# Patient Record
Sex: Female | Born: 1945 | Race: White | Hispanic: No | Marital: Married | Smoking: Never smoker
Health system: Southern US, Community
[De-identification: ages and names within clinical notes are randomized; demographics above are authoritative.]

## PROBLEM LIST (undated history)

## (undated) DIAGNOSIS — I10 Essential (primary) hypertension: Secondary | ICD-10-CM

## (undated) DIAGNOSIS — N979 Female infertility, unspecified: Secondary | ICD-10-CM

## (undated) DIAGNOSIS — C4491 Basal cell carcinoma of skin, unspecified: Secondary | ICD-10-CM

## (undated) DIAGNOSIS — M47819 Spondylosis without myelopathy or radiculopathy, site unspecified: Secondary | ICD-10-CM

## (undated) DIAGNOSIS — D219 Benign neoplasm of connective and other soft tissue, unspecified: Secondary | ICD-10-CM

## (undated) DIAGNOSIS — C801 Malignant (primary) neoplasm, unspecified: Secondary | ICD-10-CM

## (undated) DIAGNOSIS — IMO0002 Reserved for concepts with insufficient information to code with codable children: Secondary | ICD-10-CM

## (undated) DIAGNOSIS — E7800 Pure hypercholesterolemia, unspecified: Secondary | ICD-10-CM

## (undated) DIAGNOSIS — I719 Aortic aneurysm of unspecified site, without rupture: Secondary | ICD-10-CM

## (undated) HISTORY — PX: TONSILLECTOMY AND ADENOIDECTOMY: SUR1326

## (undated) HISTORY — DX: Essential (primary) hypertension: I10

## (undated) HISTORY — PX: ANKLE SURGERY: SHX546

## (undated) HISTORY — DX: Spondylosis without myelopathy or radiculopathy, site unspecified: M47.819

## (undated) HISTORY — PX: RETINAL DETACHMENT SURGERY: SHX105

## (undated) HISTORY — DX: Female infertility, unspecified: N97.9

## (undated) HISTORY — DX: Benign neoplasm of connective and other soft tissue, unspecified: D21.9

## (undated) HISTORY — DX: Malignant (primary) neoplasm, unspecified: C80.1

## (undated) HISTORY — DX: Basal cell carcinoma of skin, unspecified: C44.91

## (undated) HISTORY — DX: Pure hypercholesterolemia, unspecified: E78.00

## (undated) HISTORY — DX: Aortic aneurysm of unspecified site, without rupture: I71.9

## (undated) HISTORY — DX: Reserved for concepts with insufficient information to code with codable children: IMO0002

## (undated) HISTORY — PX: EYE SURGERY: SHX253

---

## 1999-03-31 ENCOUNTER — Other Ambulatory Visit: Admission: RE | Admit: 1999-03-31 | Discharge: 1999-03-31 | Payer: Self-pay | Admitting: Obstetrics and Gynecology

## 2000-01-13 ENCOUNTER — Other Ambulatory Visit: Admission: RE | Admit: 2000-01-13 | Discharge: 2000-01-13 | Payer: Self-pay | Admitting: Obstetrics and Gynecology

## 2000-01-13 ENCOUNTER — Encounter (INDEPENDENT_AMBULATORY_CARE_PROVIDER_SITE_OTHER): Payer: Self-pay | Admitting: Specialist

## 2000-04-12 ENCOUNTER — Other Ambulatory Visit: Admission: RE | Admit: 2000-04-12 | Discharge: 2000-04-12 | Payer: Self-pay | Admitting: Obstetrics and Gynecology

## 2000-04-13 ENCOUNTER — Encounter (INDEPENDENT_AMBULATORY_CARE_PROVIDER_SITE_OTHER): Payer: Self-pay | Admitting: Specialist

## 2000-04-13 ENCOUNTER — Other Ambulatory Visit: Admission: RE | Admit: 2000-04-13 | Discharge: 2000-04-13 | Payer: Self-pay | Admitting: Obstetrics and Gynecology

## 2000-07-14 ENCOUNTER — Other Ambulatory Visit: Admission: RE | Admit: 2000-07-14 | Discharge: 2000-07-14 | Payer: Self-pay | Admitting: Obstetrics and Gynecology

## 2000-10-15 ENCOUNTER — Other Ambulatory Visit: Admission: RE | Admit: 2000-10-15 | Discharge: 2000-10-15 | Payer: Self-pay | Admitting: Obstetrics and Gynecology

## 2000-10-15 ENCOUNTER — Encounter (INDEPENDENT_AMBULATORY_CARE_PROVIDER_SITE_OTHER): Payer: Self-pay

## 2001-04-12 ENCOUNTER — Other Ambulatory Visit: Admission: RE | Admit: 2001-04-12 | Discharge: 2001-04-12 | Payer: Self-pay | Admitting: Obstetrics and Gynecology

## 2002-04-17 ENCOUNTER — Other Ambulatory Visit: Admission: RE | Admit: 2002-04-17 | Discharge: 2002-04-17 | Payer: Self-pay | Admitting: Obstetrics and Gynecology

## 2003-04-23 ENCOUNTER — Other Ambulatory Visit: Admission: RE | Admit: 2003-04-23 | Discharge: 2003-04-23 | Payer: Self-pay | Admitting: Obstetrics and Gynecology

## 2004-04-25 ENCOUNTER — Other Ambulatory Visit: Admission: RE | Admit: 2004-04-25 | Discharge: 2004-04-25 | Payer: Self-pay | Admitting: Obstetrics and Gynecology

## 2005-04-27 ENCOUNTER — Other Ambulatory Visit: Admission: RE | Admit: 2005-04-27 | Discharge: 2005-04-27 | Payer: Self-pay | Admitting: Obstetrics and Gynecology

## 2006-05-04 ENCOUNTER — Other Ambulatory Visit: Admission: RE | Admit: 2006-05-04 | Discharge: 2006-05-04 | Payer: Self-pay | Admitting: Obstetrics and Gynecology

## 2006-09-01 ENCOUNTER — Ambulatory Visit (HOSPITAL_COMMUNITY): Admission: RE | Admit: 2006-09-01 | Discharge: 2006-09-01 | Payer: Self-pay | Admitting: Plastic Surgery

## 2007-05-06 ENCOUNTER — Other Ambulatory Visit: Admission: RE | Admit: 2007-05-06 | Discharge: 2007-05-06 | Payer: Self-pay | Admitting: Obstetrics and Gynecology

## 2008-05-09 ENCOUNTER — Other Ambulatory Visit: Admission: RE | Admit: 2008-05-09 | Discharge: 2008-05-09 | Payer: Self-pay | Admitting: Obstetrics and Gynecology

## 2008-07-18 ENCOUNTER — Encounter: Admission: RE | Admit: 2008-07-18 | Discharge: 2008-07-18 | Payer: Self-pay | Admitting: Radiology

## 2008-07-28 DIAGNOSIS — C801 Malignant (primary) neoplasm, unspecified: Secondary | ICD-10-CM

## 2008-07-28 DIAGNOSIS — I719 Aortic aneurysm of unspecified site, without rupture: Secondary | ICD-10-CM

## 2008-07-28 HISTORY — DX: Malignant (primary) neoplasm, unspecified: C80.1

## 2008-07-28 HISTORY — DX: Aortic aneurysm of unspecified site, without rupture: I71.9

## 2008-07-28 HISTORY — PX: BREAST SURGERY: SHX581

## 2008-07-31 ENCOUNTER — Ambulatory Visit (HOSPITAL_COMMUNITY): Admission: RE | Admit: 2008-07-31 | Discharge: 2008-07-31 | Payer: Self-pay | Admitting: General Surgery

## 2008-07-31 ENCOUNTER — Encounter (INDEPENDENT_AMBULATORY_CARE_PROVIDER_SITE_OTHER): Payer: Self-pay | Admitting: General Surgery

## 2008-07-31 HISTORY — PX: MASTECTOMY, PARTIAL: SHX709

## 2008-08-01 ENCOUNTER — Ambulatory Visit: Payer: Self-pay | Admitting: Oncology

## 2008-08-22 LAB — CBC WITH DIFFERENTIAL/PLATELET
Eosinophils Absolute: 0.2 10*3/uL (ref 0.0–0.5)
HCT: 38.4 % (ref 34.8–46.6)
LYMPH%: 22.1 % (ref 14.0–48.0)
MCV: 93.7 fL (ref 81.0–101.0)
MONO#: 0.7 10*3/uL (ref 0.1–0.9)
MONO%: 7.3 % (ref 0.0–13.0)
NEUT#: 6.1 10*3/uL (ref 1.5–6.5)
NEUT%: 68.1 % (ref 39.6–76.8)
Platelets: 311 10*3/uL (ref 145–400)
RBC: 4.1 10*6/uL (ref 3.70–5.32)

## 2008-08-23 LAB — LACTATE DEHYDROGENASE: LDH: 138 U/L (ref 94–250)

## 2008-08-23 LAB — CANCER ANTIGEN 27.29: CA 27.29: 20 U/mL (ref 0–39)

## 2008-08-23 LAB — COMPREHENSIVE METABOLIC PANEL
Alkaline Phosphatase: 42 U/L (ref 39–117)
BUN: 15 mg/dL (ref 6–23)
CO2: 26 mEq/L (ref 19–32)
Creatinine, Ser: 0.78 mg/dL (ref 0.40–1.20)
Glucose, Bld: 96 mg/dL (ref 70–99)
Total Bilirubin: 1 mg/dL (ref 0.3–1.2)

## 2008-08-23 LAB — VITAMIN D 25 HYDROXY (VIT D DEFICIENCY, FRACTURES): Vit D, 25-Hydroxy: 45 ng/mL (ref 30–89)

## 2008-11-13 ENCOUNTER — Ambulatory Visit: Payer: Self-pay | Admitting: Oncology

## 2008-11-15 LAB — CBC WITH DIFFERENTIAL/PLATELET
Basophils Absolute: 0 10*3/uL (ref 0.0–0.1)
EOS%: 2.7 % (ref 0.0–7.0)
Eosinophils Absolute: 0.2 10*3/uL (ref 0.0–0.5)
HGB: 12.4 g/dL (ref 11.6–15.9)
MCH: 32.3 pg (ref 26.0–34.0)
MONO#: 0.5 10*3/uL (ref 0.1–0.9)
NEUT#: 4.8 10*3/uL (ref 1.5–6.5)
RDW: 13.1 % (ref 11.3–14.5)
WBC: 6.6 10*3/uL (ref 3.9–10.0)
lymph#: 1.1 10*3/uL (ref 0.9–3.3)

## 2008-11-15 LAB — COMPREHENSIVE METABOLIC PANEL
AST: 16 U/L (ref 0–37)
Albumin: 4.5 g/dL (ref 3.5–5.2)
BUN: 24 mg/dL — ABNORMAL HIGH (ref 6–23)
Calcium: 9.6 mg/dL (ref 8.4–10.5)
Chloride: 103 mEq/L (ref 96–112)
Potassium: 4.8 mEq/L (ref 3.5–5.3)

## 2009-03-12 ENCOUNTER — Ambulatory Visit: Payer: Self-pay | Admitting: Oncology

## 2009-03-14 LAB — CBC WITH DIFFERENTIAL/PLATELET
Basophils Absolute: 0 10*3/uL (ref 0.0–0.1)
HCT: 37.5 % (ref 34.8–46.6)
HGB: 12.7 g/dL (ref 11.6–15.9)
MCH: 31.8 pg (ref 25.1–34.0)
MONO#: 0.5 10*3/uL (ref 0.1–0.9)
NEUT%: 69.7 % (ref 38.4–76.8)
Platelets: 245 10*3/uL (ref 145–400)
WBC: 7.7 10*3/uL (ref 3.9–10.3)
lymph#: 1.5 10*3/uL (ref 0.9–3.3)

## 2009-03-14 LAB — COMPREHENSIVE METABOLIC PANEL
Albumin: 4.2 g/dL (ref 3.5–5.2)
BUN: 17 mg/dL (ref 6–23)
CO2: 29 mEq/L (ref 19–32)
Calcium: 9.7 mg/dL (ref 8.4–10.5)
Chloride: 102 mEq/L (ref 96–112)
Glucose, Bld: 111 mg/dL — ABNORMAL HIGH (ref 70–99)
Potassium: 4.3 mEq/L (ref 3.5–5.3)

## 2009-05-02 ENCOUNTER — Ambulatory Visit: Payer: Self-pay | Admitting: Oncology

## 2009-05-06 LAB — CBC WITH DIFFERENTIAL/PLATELET
EOS%: 2 % (ref 0.0–7.0)
Eosinophils Absolute: 0.2 10*3/uL (ref 0.0–0.5)
LYMPH%: 19.6 % (ref 14.0–49.7)
MCH: 32.4 pg (ref 25.1–34.0)
MCHC: 34.8 g/dL (ref 31.5–36.0)
MCV: 93.1 fL (ref 79.5–101.0)
MONO%: 6 % (ref 0.0–14.0)
NEUT#: 6.7 10*3/uL — ABNORMAL HIGH (ref 1.5–6.5)
Platelets: 224 10*3/uL (ref 145–400)
RBC: 3.65 10*6/uL — ABNORMAL LOW (ref 3.70–5.45)
RDW: 12.4 % (ref 11.2–14.5)

## 2009-05-07 LAB — COMPREHENSIVE METABOLIC PANEL
AST: 20 U/L (ref 0–37)
Alkaline Phosphatase: 33 U/L — ABNORMAL LOW (ref 39–117)
Glucose, Bld: 105 mg/dL — ABNORMAL HIGH (ref 70–99)
Sodium: 136 mEq/L (ref 135–145)
Total Bilirubin: 0.8 mg/dL (ref 0.3–1.2)
Total Protein: 7.2 g/dL (ref 6.0–8.3)

## 2009-05-07 LAB — VITAMIN D 25 HYDROXY (VIT D DEFICIENCY, FRACTURES): Vit D, 25-Hydroxy: 46 ng/mL (ref 30–89)

## 2009-07-26 ENCOUNTER — Ambulatory Visit: Payer: Self-pay | Admitting: Oncology

## 2009-07-30 LAB — CBC WITH DIFFERENTIAL/PLATELET
BASO%: 0.5 % (ref 0.0–2.0)
Eosinophils Absolute: 0.2 10*3/uL (ref 0.0–0.5)
LYMPH%: 21.7 % (ref 14.0–49.7)
MCHC: 35 g/dL (ref 31.5–36.0)
MONO#: 0.5 10*3/uL (ref 0.1–0.9)
NEUT#: 5.8 10*3/uL (ref 1.5–6.5)
Platelets: 221 10*3/uL (ref 145–400)
RBC: 3.61 10*6/uL — ABNORMAL LOW (ref 3.70–5.45)
WBC: 8.4 10*3/uL (ref 3.9–10.3)
lymph#: 1.8 10*3/uL (ref 0.9–3.3)

## 2009-07-30 LAB — RESEARCH LABS

## 2009-07-31 LAB — CANCER ANTIGEN 27.29: CA 27.29: 12 U/mL (ref 0–39)

## 2009-07-31 LAB — COMPREHENSIVE METABOLIC PANEL
ALT: 19 U/L (ref 0–35)
AST: 20 U/L (ref 0–37)
Albumin: 4.4 g/dL (ref 3.5–5.2)
CO2: 25 mEq/L (ref 19–32)
Calcium: 9.9 mg/dL (ref 8.4–10.5)
Chloride: 102 mEq/L (ref 96–112)
Potassium: 4.2 mEq/L (ref 3.5–5.3)
Sodium: 137 mEq/L (ref 135–145)
Total Protein: 6.9 g/dL (ref 6.0–8.3)

## 2010-08-26 ENCOUNTER — Ambulatory Visit: Payer: Self-pay | Admitting: Oncology

## 2010-08-28 LAB — CBC WITH DIFFERENTIAL/PLATELET
Basophils Absolute: 0 10*3/uL (ref 0.0–0.1)
Eosinophils Absolute: 0.2 10*3/uL (ref 0.0–0.5)
HCT: 35.1 % (ref 34.8–46.6)
HGB: 11.7 g/dL (ref 11.6–15.9)
LYMPH%: 24.7 % (ref 14.0–49.7)
MCV: 95.4 fL (ref 79.5–101.0)
MONO#: 0.5 10*3/uL (ref 0.1–0.9)
MONO%: 7.3 % (ref 0.0–14.0)
NEUT#: 4.2 10*3/uL (ref 1.5–6.5)
Platelets: 228 10*3/uL (ref 145–400)
WBC: 6.6 10*3/uL (ref 3.9–10.3)

## 2010-08-28 LAB — COMPREHENSIVE METABOLIC PANEL
Albumin: 4 g/dL (ref 3.5–5.2)
Alkaline Phosphatase: 34 U/L — ABNORMAL LOW (ref 39–117)
BUN: 14 mg/dL (ref 6–23)
CO2: 28 mEq/L (ref 19–32)
Glucose, Bld: 95 mg/dL (ref 70–99)
Total Bilirubin: 1 mg/dL (ref 0.3–1.2)
Total Protein: 6.9 g/dL (ref 6.0–8.3)

## 2010-08-28 LAB — CANCER ANTIGEN 27.29: CA 27.29: 11 U/mL (ref 0–39)

## 2011-05-12 NOTE — Op Note (Signed)
Sandra Tapia, Sandra Tapia           ACCOUNT NO.:  [id number]   MEDICAL RECORD NO.:  [id number]          PATIENT TYPE:  AMB   LOCATION:  SDS                          FACILITY:  [facility]   PHYSICIAN:  Angelia Mould. Derrell Lolling, M.D.DATE OF BIRTH:  01-19-1946   DATE OF PROCEDURE:  07/31/2008  DATE OF DISCHARGE:                               OPERATIVE REPORT   PREOPERATIVE DIAGNOSIS:  Invasive ductal carcinoma, right breast.   POSTOPERATIVE DIAGNOSIS:  Invasive ductal carcinoma, right breast.   OPERATION PERFORMED:  1. Injection of blue dye, right breast.  2. Right partial mastectomy with needle localization and specimen      mammogram.  3. Right axillary sentinel lymph node mapping and biopsy.   SURGEON:  Angelia Mould. Derrell Lolling, MD   OPERATIVE INDICATIONS:  This is a 65 year old white female who recently  felt a lump in the lateral aspect of her right breast.  She had  ultrasound, mammograms, a breast specific gamma imaging, and MRI.  She  was found to have what appeared to be about a 2.0-cm mass at the 8:30  position of the right breast, 2 cm lateral to the nipple.  Core biopsy  showed invasive ductal carcinoma.  MRI showed this to be a solitary  finding.  She was counseled as an outpatient.  Although her breasts are  small, she wanted to see if we could do a breast conservation procedure  and I felt that was reasonable to attempt.  She is brought to the  operating room electively.   OPERATIVE TECHNIQUE:  The patient underwent wire needle localization of  the right breast tumor at [facility] office prior to the  surgery and then the patient came to [facility].  In the holding  area, she was injected with radionuclide by the nuclear medicine  technologist.  The patient was then taken to the operating room where  general anesthesia was induced.  The patient was identified as correct  patient, correct procedure, and correct site.  Following an alcohol  prep, I injected 5 mL of blue dye  behind the right areola.  This was 2  mL of methylene blue mixed with 3 mL of saline.  The breast was massaged  for 5 minutes.  The right breast and shoulder and axilla and chest wall  were then prepped and draped in sterile fashion.  Intravenous  antibiotics were given.  Marcaine 0.5% with epinephrine was used as a  local infiltration anesthetic.   I first performed the lumpectomy.  I made a radially oriented elliptical  incision at about the 8 o'clock position of the right breast.  I excised  an ellipse of skin along with the specimen.  Using the wire localization  as a guide, I dissected widely around this area all the way down to the  chest wall.  I marked the specimen with the 6-color margin marker kit.  The breast specimen was sent to Dr. Jeralyn Ruths, and she performed  specimen mammogram and said that the tumor was right in the center of  the specimen, and the specimen was sent for routine histology.  The  wound  was irrigated with saline.  Hemostasis was excellent and achieved  with electrocautery.  The deeper breast tissues were closed with  interrupted sutures of 3-0 Vicryl.  The skin was closed with a running  subcuticular suture of 4-0 Monocryl and Dermabond.   Attention was then directed to the right axilla.  Using the NeoProbe, I  identified focal area of increased radioactivity.  Transverse incision  was made in this area, which was near the hairline.  Dissection was  carried down through the subcutaneous tissue.  I used the NeoProbe as a  guide and then dissected into the axilla by dividing the clavipectoral  fascia.  I very easily found two very hot, very blue sentinel nodes and  sent them to the lab.  Dr. Clelia Croft examined both of them and said that the  imprint cytology was negative for cancer cells.  Hemostasis of the right  axilla was excellent.  The wound was irrigated with saline.  The deeper  tissues were closed with interrupted sutures of 3-0 Vicryl.  The skin   was closed with a running subcuticular suture of 4-0 Monocryl and  Dermabond.  Clean bandages and ice pack were placed.  The patient  tolerated the procedure well and was taken to the recovery room in  stable condition.  Estimated blood loss was about 20 mL.  Complications  none.  Sponge, needle, and instrument counts were correct.      Angelia Mould. Derrell Lolling, M.D.  Electronically Signed     HMI/MEDQ  D:  07/31/2008  T:  08/01/2008  Job:  [id number]   cc:   Edwena Felty. Romine, M.D.  Pam Drown, M.D.  Jake Bathe, MD  [facility]

## 2011-09-11 ENCOUNTER — Other Ambulatory Visit: Payer: Self-pay | Admitting: Oncology

## 2011-09-11 ENCOUNTER — Encounter (HOSPITAL_BASED_OUTPATIENT_CLINIC_OR_DEPARTMENT_OTHER): Payer: MEDICARE | Admitting: Oncology

## 2011-09-11 DIAGNOSIS — C50419 Malignant neoplasm of upper-outer quadrant of unspecified female breast: Secondary | ICD-10-CM

## 2011-09-11 LAB — CBC WITH DIFFERENTIAL/PLATELET
BASO%: 0.6 % (ref 0.0–2.0)
Basophils Absolute: 0.1 10*3/uL (ref 0.0–0.1)
EOS%: 1.8 % (ref 0.0–7.0)
HCT: 38.7 % (ref 34.8–46.6)
HGB: 13 g/dL (ref 11.6–15.9)
MCH: 31.6 pg (ref 25.1–34.0)
MCHC: 33.6 g/dL (ref 31.5–36.0)
MONO#: 0.6 10*3/uL (ref 0.1–0.9)
RDW: 13 % (ref 11.2–14.5)
WBC: 7.9 10*3/uL (ref 3.9–10.3)
lymph#: 2.2 10*3/uL (ref 0.9–3.3)

## 2011-09-12 LAB — COMPREHENSIVE METABOLIC PANEL
ALT: 27 U/L (ref 0–35)
AST: 32 U/L (ref 0–37)
Albumin: 4.4 g/dL (ref 3.5–5.2)
CO2: 28 mEq/L (ref 19–32)
Calcium: 9.6 mg/dL (ref 8.4–10.5)
Chloride: 101 mEq/L (ref 96–112)
Potassium: 4 mEq/L (ref 3.5–5.3)

## 2011-09-12 LAB — VITAMIN D 25 HYDROXY (VIT D DEFICIENCY, FRACTURES): Vit D, 25-Hydroxy: 45 ng/mL (ref 30–89)

## 2011-09-17 ENCOUNTER — Encounter (HOSPITAL_BASED_OUTPATIENT_CLINIC_OR_DEPARTMENT_OTHER): Payer: MEDICARE | Admitting: Oncology

## 2011-09-17 DIAGNOSIS — C50419 Malignant neoplasm of upper-outer quadrant of unspecified female breast: Secondary | ICD-10-CM

## 2011-09-17 DIAGNOSIS — Z170 Estrogen receptor positive status [ER+]: Secondary | ICD-10-CM

## 2011-09-17 DIAGNOSIS — R209 Unspecified disturbances of skin sensation: Secondary | ICD-10-CM

## 2011-09-25 LAB — URINALYSIS, ROUTINE W REFLEX MICROSCOPIC
Bilirubin Urine: NEGATIVE
Ketones, ur: NEGATIVE
Nitrite: NEGATIVE
pH: 7.5

## 2011-09-25 LAB — COMPREHENSIVE METABOLIC PANEL
ALT: 53 — ABNORMAL HIGH
AST: 40 — ABNORMAL HIGH
Alkaline Phosphatase: 33 — ABNORMAL LOW
CO2: 27
Calcium: 10.1
Chloride: 97
GFR calc Af Amer: >60
GFR calc non Af Amer: >60
Potassium: 3.9
Sodium: 135
Total Bilirubin: 1.3 — ABNORMAL HIGH

## 2011-09-25 LAB — DIFFERENTIAL
Basophils Relative: 0
Eosinophils Absolute: 0.2
Eosinophils Relative: 3
Lymphs Abs: 1.9

## 2011-09-25 LAB — LACTATE DEHYDROGENASE: LDH: 144

## 2011-09-25 LAB — CBC
RBC: 4.16
WBC: 9.1

## 2012-01-26 ENCOUNTER — Other Ambulatory Visit: Payer: Self-pay | Admitting: Cardiology

## 2012-01-26 DIAGNOSIS — IMO0001 Reserved for inherently not codable concepts without codable children: Secondary | ICD-10-CM

## 2012-01-28 ENCOUNTER — Inpatient Hospital Stay
Admission: RE | Admit: 2012-01-28 | Discharge: 2012-01-28 | Payer: MEDICARE | Source: Ambulatory Visit | Attending: Cardiology | Admitting: Cardiology

## 2012-05-26 ENCOUNTER — Ambulatory Visit (INDEPENDENT_AMBULATORY_CARE_PROVIDER_SITE_OTHER): Payer: Medicare Other | Admitting: General Surgery

## 2012-05-26 ENCOUNTER — Encounter (INDEPENDENT_AMBULATORY_CARE_PROVIDER_SITE_OTHER): Payer: Self-pay | Admitting: General Surgery

## 2012-05-26 VITALS — BP 106/78 | HR 76 | Temp 98.6°F | Resp 14 | Ht 64.0 in | Wt 146.4 lb

## 2012-05-26 DIAGNOSIS — C50511 Malignant neoplasm of lower-outer quadrant of right female breast: Secondary | ICD-10-CM

## 2012-05-26 DIAGNOSIS — C50919 Malignant neoplasm of unspecified site of unspecified female breast: Secondary | ICD-10-CM

## 2012-05-26 NOTE — Patient Instructions (Signed)
Your physical exam today is normal and there is no evidence of breast cancer or enlarged lymph nodes.  Be sure to get your mammograms in August of this year.  Keep your appt.  with Dr. Nicola Girt in September of this year.  We talked about plastic surgery referral again. Please call me if you want me to help you make that referral and I will do so.  Return to see Dr. Derrell Lolling in one year.

## 2012-05-26 NOTE — Progress Notes (Signed)
Patient ID: Sandra Tapia, female   DOB: 1946/09/11, 66 y.o.   MRN: [id number]  Chief Complaint  Patient presents with  . Breast Cancer Long Term Follow Up    Yrly Breast ck    HPI Sandra Tapia is a 66 y.o. female.  She returns for long-term followup of her right breast cancer.  This patient underwent right partial mastectomy and sentinel lymph node biopsy on August 01, 1999. Pathologically she has a T1c., N0, receptor positive, HER-2-negative, Ki67 99% tumor. She had radiation therapy and is on adjuvant tamoxifen. She has no evidence of recurrence to date.  Last mammogram for August 09, 2011. Category 2. No focal abnormality.  She feels fine. No complaints about her breast. Only concern is a 20 pound weight gain over 4 years which she attributes to tamoxifen.  She also states that because her right breast is a little bit smaller than the left she saw Dr. Kelly Splinter andtalked about latissimus flap to build the right breast a little bit but it sounds like she more or less discouraged symmetry procedures. She still questions this. HPI  Past Medical History  Diagnosis Date  . Hypertension   . Cancer     Past Surgical History  Procedure Date  . Ankle surgery   . Breast surgery   . Abdominal aortic aneurysm repair   . Eye surgery   . Retinal detachment surgery june 28th 2012    History reviewed. No pertinent family history.  Social History History  Substance Use Topics  . Smoking status: Never Smoker   . Smokeless tobacco: Not on file  . Alcohol Use: Yes    Allergies  Allergen Reactions  . Penicillins Nausea Only    Current Outpatient Prescriptions  Medication Sig Dispense Refill  . ALPHAGAN P 0.1 % SOLN       . aspirin 81 MG tablet Take 81 mg by mouth daily.      . Calcium Carbonate Antacid (TUMS ULTRA 1000 PO) Take by mouth.      . diclofenac (VOLTAREN) 75 MG EC tablet Take 75 mg by mouth 2 (two) times daily.      . fexofenadine (ALLEGRA) 180 MG tablet Take  180 mg by mouth daily.      . fish oil-omega-3 fatty acids 1000 MG capsule Take 2 g by mouth daily.      Marland Kitchen gabapentin (NEURONTIN) 300 MG capsule Take 300 mg by mouth 3 (three) times daily.      Marland Kitchen lisinopril-hydrochlorothiazide (PRINZIDE,ZESTORETIC) 10-12.5 MG per tablet Take 1 tablet by mouth daily.      . Multiple Vitamin (MULTIVITAMIN) tablet Take 1 tablet by mouth daily.      . simvastatin (ZOCOR) 20 MG tablet       . tamoxifen (NOLVADEX) 20 MG tablet Take 20 mg by mouth daily.      Marland Kitchen venlafaxine XR (EFFEXOR-XR) 37.5 MG 24 hr capsule Take 37.5 mg by mouth daily.      . Vitamin D, Ergocalciferol, (DRISDOL) 50000 UNITS CAPS Take 50,000 Units by mouth.        Review of Systems Review of Systems  Constitutional: Positive for unexpected weight change. Negative for fever and chills.  HENT: Negative for hearing loss, congestion, sore throat, trouble swallowing and voice change.   Eyes: Negative for visual disturbance.  Respiratory: Negative for cough and wheezing.   Cardiovascular: Negative for chest pain, palpitations and leg swelling.  Gastrointestinal: Negative for nausea, vomiting, abdominal pain, diarrhea, constipation, blood in stool,  abdominal distention and anal bleeding.  Genitourinary: Negative for hematuria, vaginal bleeding and difficulty urinating.  Musculoskeletal: Negative for arthralgias.  Skin: Negative for rash and wound.  Neurological: Negative for seizures, syncope and headaches.  Hematological: Negative for adenopathy. Does not bruise/bleed easily.  Psychiatric/Behavioral: Negative for confusion.    Blood pressure 106/78, pulse 76, temperature 98.6 F (37 C), temperature source Temporal, resp. rate 14, height 5\' 4"  (1.626 m), weight 146 lb 6.4 oz (66.407 kg).  Physical Exam Physical Exam  Constitutional: She is oriented to person, place, and time. She appears well-developed and well-nourished. No distress.  Eyes: Conjunctivae and EOM are normal. Pupils are equal,  round, and reactive to light. Left eye exhibits no discharge. No scleral icterus.  Neck: Neck supple. No JVD present. No tracheal deviation present. No thyromegaly present.  Cardiovascular: Normal rate, regular rhythm, normal heart sounds and intact distal pulses.   No murmur heard. Pulmonary/Chest: Effort normal and breath sounds normal. No respiratory distress. She has no wheezes. She has no rales. She exhibits no tenderness.    Musculoskeletal: She exhibits no edema and no tenderness.  Lymphadenopathy:    She has no cervical adenopathy.  Neurological: She is alert and oriented to person, place, and time. She exhibits normal muscle tone. Coordination normal.  Skin: Skin is warm. No rash noted. She is not diaphoretic. No erythema. No pallor.  Psychiatric: She has a normal mood and affect. Her behavior is normal. Judgment and thought content normal.    Data Reviewed Mammograms, old records, notes from [facility]  Assessment    Invasive ductal carcinoma right breast, lower outer quadrant, stage TI C., N0, receptor positive, HER-2-negative, Ki-67 99%.  No evidence of recurrence 3-1/2 years following right partial mastectomy and sentinel node biopsy.  Mild cosmetic deformity of right breast, volume loss only, otherwise contoured and nipple projection good.    Plan    I told her that I thought she was doing very well.  I told her that if she wanted a second opinion regarding whether she would be a good candidate for symmetry procedure that I would send her to [facility] department plastic surgery. She is going to consider that and call me back.  Mammograms will be repeated in August of 2013.  She will see Dr. Darnelle Catalan yearly in September 2013.  Return to see me in one year.       Sandra Tapia, M.D., [facility] General and Minimally invasive Surgery Breast and Colorectal Surgery Office:   [phone] Pager:   [phone]  05/26/2012, 11:56  AM

## 2012-07-14 ENCOUNTER — Encounter (INDEPENDENT_AMBULATORY_CARE_PROVIDER_SITE_OTHER): Payer: Self-pay

## 2012-09-07 ENCOUNTER — Other Ambulatory Visit: Payer: Self-pay | Admitting: Oncology

## 2012-09-07 ENCOUNTER — Other Ambulatory Visit: Payer: MEDICARE | Admitting: Lab

## 2012-09-07 DIAGNOSIS — C50419 Malignant neoplasm of upper-outer quadrant of unspecified female breast: Secondary | ICD-10-CM

## 2012-09-07 DIAGNOSIS — Z170 Estrogen receptor positive status [ER+]: Secondary | ICD-10-CM

## 2012-09-07 LAB — CBC WITH DIFFERENTIAL/PLATELET
Basophils Absolute: 0.1 10*3/uL (ref 0.0–0.1)
Eosinophils Absolute: 0.2 10*3/uL (ref 0.0–0.5)
HCT: 39.6 % (ref 34.8–46.6)
HGB: 13.3 g/dL (ref 11.6–15.9)
MONO#: 0.5 10*3/uL (ref 0.1–0.9)
NEUT%: 59 % (ref 38.4–76.8)
WBC: 5.5 10*3/uL (ref 3.9–10.3)
lymph#: 1.5 10*3/uL (ref 0.9–3.3)

## 2012-09-07 LAB — COMPREHENSIVE METABOLIC PANEL (CC13)
AST: 46 U/L — ABNORMAL HIGH (ref 5–34)
BUN: 11 mg/dL (ref 7.0–26.0)
CO2: 24 mEq/L (ref 22–29)
Calcium: 10.1 mg/dL (ref 8.4–10.4)
Chloride: 102 mEq/L (ref 98–107)
Creatinine: 0.8 mg/dL (ref 0.6–1.1)

## 2012-09-08 ENCOUNTER — Other Ambulatory Visit: Payer: MEDICARE | Admitting: Lab

## 2012-09-15 ENCOUNTER — Ambulatory Visit (HOSPITAL_BASED_OUTPATIENT_CLINIC_OR_DEPARTMENT_OTHER): Payer: MEDICARE | Admitting: Oncology

## 2012-09-15 ENCOUNTER — Telehealth: Payer: Self-pay | Admitting: Oncology

## 2012-09-15 VITALS — BP 157/78 | HR 76 | Temp 98.7°F | Resp 20 | Ht 64.0 in | Wt 145.3 lb

## 2012-09-15 DIAGNOSIS — C50419 Malignant neoplasm of upper-outer quadrant of unspecified female breast: Secondary | ICD-10-CM

## 2012-09-15 DIAGNOSIS — Z170 Estrogen receptor positive status [ER+]: Secondary | ICD-10-CM

## 2012-09-15 DIAGNOSIS — C50911 Malignant neoplasm of unspecified site of right female breast: Secondary | ICD-10-CM | POA: Insufficient documentation

## 2012-09-15 DIAGNOSIS — C50919 Malignant neoplasm of unspecified site of unspecified female breast: Secondary | ICD-10-CM

## 2012-09-15 MED ORDER — LETROZOLE 2.5 MG PO TABS
2.5000 mg | ORAL_TABLET | Freq: Every day | ORAL | Status: DC
Start: 2012-09-15 — End: 2013-12-03

## 2012-09-15 MED ORDER — VENLAFAXINE HCL 37.5 MG PO TABS: 37.5000 mg | ORAL_TABLET | Freq: Two times a day (BID) | ORAL | Status: DC

## 2012-09-15 NOTE — Telephone Encounter (Signed)
gv pt appt schedule for march 2014 °

## 2012-09-15 NOTE — Progress Notes (Signed)
ID: Sandra Tapia   DOB: February 04, 1946  MR#: [id number]  WUJ#:[id number]  PCP: Gweneth Dimitri, MD GYN: C. Romine SU: Edythe [name] OTHER MD: Liliane Shi, J. kinard   HISTORY OF PRESENT ILLNESS: She felt a soreness in her right breast, a little bit before the July 4 weekend, but on June 30 2008 particularly she palpated a mass, which was new.  She brought it to Dr. Harlene Salts attention and she was set up for evaluation at the [facility].  Noted that she had screening mammography April 16, 2008, which found very dense breasts, but otherwise no worrisome findings.  Now, on July 6 the patient was found by Dr. Tor Netters to have a palpable abnormality in the upper, outer quadrant of the right breast.  By mammogram this was asymmetric with ill-defined margins and measured up to 2.1 cm.  Ultrasound showed this to measure 1.4 cm.  There was also a hypoechoic mass in the axilla measuring up to 2.6 cm, but morphologically it appeared as a normal lymph node.  Breast specific gamma imaging was performed July 8 and showed no abnormality on the left.  On the right, there was a single area of moderately intense activity measuring 1.3 cm maximally.  On the same day biopsy was obtained and showed ([id number] and [id number]) an invasive ductal carcinoma, which was 98% ER positive, a 100% PR positive with a low proliferation fraction at 9%, HercepTest equivocal at 2%, but negative by FISH with a ratio of 1.21.  With this information the patient was referred to Dr. Derrell Lolling and bilateral breast MRIs were obtained July 22.  This showed only the solitary mass in the lower outer quadrant of the right breast measuring 1.7 cm by MRI.  Accordingly on August 4, the patient proceeded to right lumpectomy and sentinel lymph node sampling.  The final pathology [id number]) showed a 1.6 cm invasive ductal carcinoma, grade 1, with negative and ample margins and 0 of 2 sentinel lymph nodes involved. Her subsequent history is as detailed  below  INTERVAL HISTORY: [name] returns today with her husband Sandra Tapia for followup of her breast cancer. She's doing a lot of reading, walking about 30 minutes a day most days, and planning a trip to [place] to watch the leaves change.  REVIEW OF SYSTEMS: On the other hand, she is really having a hard time continuing on the tamoxifen. She has severe hot flashes, which aggravate her rosacea. She can't lose weight. She describes herself is moderately fatigued. She does have some lower back pain but it hasn't increased in frequency or intensity over the last several years in her aneurysm is being followed by Dr. Eula Listen and she tells me it stable. She bruises easily and has some joint pains which have not changed either an overall aside from the fact that she seems more intolerant of the tamoxifen a detailed review of systems today was stable  PAST MEDICAL HISTORY: Past Medical History  Diagnosis Date  . Hypertension   . Cancer   She has history of seasonal allergies, a history of osteopenia, a history of moderate osteoarthritis for which she takes nonsteroidals twice daily, history of hyperlipidemia, history of hypertension, history of a newly found ascending aortic aneurysm measuring 4.1 cm and evaluated by Dr. Donette Larry, history of early cataracts.  PAST SURGICAL HISTORY: Past Surgical History  Procedure Date  . Ankle surgery   . Breast surgery   . Abdominal aortic aneurysm repair   . Eye surgery   . Retinal detachment  surgery june 28th 2012    FAMILY HISTORY (updated SEPT 2013) No family history on file. The patient's parents are alive, though her father at 17 is declining.  The patient has one sister in good health.  There is no history of breast or ovarian cancer in the family except for the patient's father's sister (one of two) who was diagnosed in her 18s.  GYNECOLOGIC HISTORY: The patient is Gx, P1, first pregnancy to term age 41, last menstrual period 2003.  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  Her husband, Sandra Tapia, is retired from Administrator.  They have a daughter Sandra Tapia who works for [employer] and two grandchildren.  She lives in [place].  The patient attends [employer] in [place].   ADVANCED DIRECTIVES:  HEALTH MAINTENANCE: History  Substance Use Topics  . Smoking status: Never Smoker   . Smokeless tobacco: Not on file  . Alcohol Use: Yes     Colonoscopy:  PAP:  Bone density:  Lipid panel:  Allergies  Allergen Reactions  . Penicillins Nausea Only    Current Outpatient Prescriptions  Medication Sig Dispense Refill  . ALPHAGAN P 0.1 % SOLN       . aspirin 81 MG tablet Take 81 mg by mouth daily.      . Calcium Carbonate Antacid (TUMS ULTRA 1000 PO) Take by mouth.      . diclofenac (VOLTAREN) 75 MG EC tablet Take 75 mg by mouth 2 (two) times daily.      . fexofenadine (ALLEGRA) 180 MG tablet Take 180 mg by mouth daily.      . fish oil-omega-3 fatty acids 1000 MG capsule Take 2 g by mouth daily.      Marland Kitchen gabapentin (NEURONTIN) 300 MG capsule Take 300 mg by mouth 3 (three) times daily.      Marland Kitchen lisinopril-hydrochlorothiazide (PRINZIDE,ZESTORETIC) 10-12.5 MG per tablet Take 1 tablet by mouth daily.      . Multiple Vitamin (MULTIVITAMIN) tablet Take 1 tablet by mouth daily.      . simvastatin (ZOCOR) 20 MG tablet       . tamoxifen (NOLVADEX) 20 MG tablet Take 20 mg by mouth daily.      Marland Kitchen venlafaxine XR (EFFEXOR-XR) 37.5 MG 24 hr capsule Take 37.5 mg by mouth daily.      . Vitamin D, Ergocalciferol, (DRISDOL) 50000 UNITS CAPS Take 50,000 Units by mouth.        OBJECTIVE: Middle-aged white woman in no acute distress Filed Vitals:   09/15/12 1518  BP: 157/78  Pulse: 76  Temp: 98.7 F (37.1 C)  Resp: 20     Body mass index is 24.94 kg/(m^2).    ECOG FS: 0  Sclerae unicteric Oropharynx clear No cervical or supraclavicular adenopathy Lungs  no rales or rhonchi Heart regular rate and rhythm Abd benign MSK no focal spinal tenderness, no peripheral edema Neuro: nonfocal Breasts: The right breast is status post lumpectomy and radiation. It is slightly smaller than the left. There is no evidence of local recurrence. The left breast is unremarkable. Both axillae are clear.  LAB RESULTS: Lab Results  Component Value Date   WBC 5.5 09/07/2012   NEUTROABS 3.3 09/07/2012   HGB 13.3 09/07/2012   HCT 39.6 09/07/2012   MCV 94.7 09/07/2012   PLT 217 09/07/2012      Chemistry      Component Value Date/Time  NA 139 09/07/2012 1106   NA 138 09/11/2011 1353   NA 138 09/11/2011 1353   K 4.6 09/07/2012 1106   K 4.0 09/11/2011 1353   K 4.0 09/11/2011 1353   CL 102 09/07/2012 1106   CL 101 09/11/2011 1353   CL 101 09/11/2011 1353   CO2 24 09/07/2012 1106   CO2 28 09/11/2011 1353   CO2 28 09/11/2011 1353   BUN 11.0 09/07/2012 1106   BUN 17 09/11/2011 1353   BUN 17 09/11/2011 1353   CREATININE 0.8 09/07/2012 1106   CREATININE 0.85 09/11/2011 1353   CREATININE 0.85 09/11/2011 1353      Component Value Date/Time   CALCIUM 10.1 09/07/2012 1106   CALCIUM 9.6 09/11/2011 1353   CALCIUM 9.6 09/11/2011 1353   ALKPHOS 42 09/07/2012 1106   ALKPHOS 38* 09/11/2011 1353   ALKPHOS 38* 09/11/2011 1353   AST 46* 09/07/2012 1106   AST 32 09/11/2011 1353   AST 32 09/11/2011 1353   ALT 41 09/07/2012 1106   ALT 27 09/11/2011 1353   ALT 27 09/11/2011 1353   BILITOT 1.00 09/07/2012 1106   BILITOT 0.6 09/11/2011 1353   BILITOT 0.6 09/11/2011 1353       Lab Results  Component Value Date   LABCA2 11 08/28/2010    No components found with this basename: [id number]    No results found for this basename: INR:1;PROTIME:1 in the last 168 hours  Urinalysis    Component Value Date/Time   COLORURINE YELLOW 07/26/2008 1145   APPEARANCEUR CLEAR 07/26/2008 1145   LABSPEC 1.008 07/26/2008 1145   PHURINE 7.5 07/26/2008 1145   GLUCOSEU NEGATIVE 07/26/2008 1145   HGBUR NEGATIVE  07/26/2008 1145   BILIRUBINUR NEGATIVE 07/26/2008 1145   KETONESUR NEGATIVE 07/26/2008 1145   PROTEINUR NEGATIVE 07/26/2008 1145   UROBILINOGEN 0.2 07/26/2008 1145   NITRITE NEGATIVE 07/26/2008 1145   LEUKOCYTESUR NEGATIVE MICROSCOPIC NOT DONE ON URINES WITH NEGATIVE PROTEIN, BLOOD, LEUKOCYTES, NITRITE, OR GLUCOSE <1000 mg/dL. 07/26/2008 1145    STUDIES: mammogram 07/2012 unremarkable .  ASSESSMENT: 66 y.o. [place] woman, status post right lumpectomy and sentinel lymph node sampling August 2009 for a T1c N0, grade 1 invasive ductal carcinoma which was strongly estrogen and progesterone-receptor positive, HER-2-negative, with a low proliferation fraction, treated with radiation, after which she started tamoxifen (October 2009)  PLAN: We went over her multiple alternatives at this point, but she is mostly interested in getting off the tamoxifen. Accordingly we are stopping that medication. We will start letrozole January 1, with the tamoxifen is completely out of her system. Accordingly she will have to pay attention mid December, and see whether by that time her hot flashes have pretty much gone away, her incision is improved, and she has been able to lose a little weight. If she hasn't, then she might as well go back on the tamoxifen because he was not what was causing the problems.  She understands the possible toxicities side effects and complications of letrozole, which were given to her in writing. Many patients have essentially no side effects on this medicine and of course other skin tolerated. The only way to find out is to try it.  She is also interested in getting off the gabapentin and the venlafaxine. Stopping the gabapentin is easy and she can stop it or and or restarted at any point. The venlafaxine is more complicated. I suggested she completes the pills she has, and then I wrote her for a generic 37.5 tablets to take  twice daily for 14 days. She will then take 1 tablet daily for 14  days, and then she will take 1 tablet every other day for 14 days. After that she can stop the venlafaxine if she wishes.  She will see me again in March of 2014. By then she will have been on letrozole for 3 months. Depending on tolerance we will decide how long she will be on it and whether or not we need to consider a bisphosphonate.  Gaynell Eggleton C    09/15/2012

## 2012-12-22 ENCOUNTER — Telehealth (INDEPENDENT_AMBULATORY_CARE_PROVIDER_SITE_OTHER): Payer: Self-pay

## 2012-12-22 NOTE — Telephone Encounter (Signed)
Pts call returned. Pt advised msg will be sent to [facility] to review with Dr Derrell Lolling next week when he returns to office. Pt understands and will await call back.

## 2012-12-26 ENCOUNTER — Telehealth (INDEPENDENT_AMBULATORY_CARE_PROVIDER_SITE_OTHER): Payer: Self-pay | Admitting: General Surgery

## 2012-12-26 NOTE — Telephone Encounter (Signed)
Called and left message for patient to advise Dr. Wayland Denis was recommended (based on his office notation). Patient provided with contact number for Dr. Leonie Green office. Advised to call back if she had any additional questions.

## 2012-12-27 ENCOUNTER — Telehealth (INDEPENDENT_AMBULATORY_CARE_PROVIDER_SITE_OTHER): Payer: Self-pay | Admitting: General Surgery

## 2012-12-27 NOTE — Telephone Encounter (Signed)
Pt states at her OV last May, you suggested a Hydrologist at [facility] for her to see to discuss reconstruction surgery.  She has already seen Dr. Kelly Splinter in consult and would like to see the one you recommended at the May OV, but cannot remember the name.  Please advise.

## 2012-12-29 NOTE — Telephone Encounter (Signed)
Per Dr Derrell Lolling, called pt to provide name of plastic surgeon at [facility]:  Dr. Renae Gloss.  Left message on answering machine at home number.

## 2013-03-16 ENCOUNTER — Telehealth: Payer: Self-pay | Admitting: Oncology

## 2013-03-16 ENCOUNTER — Ambulatory Visit (HOSPITAL_BASED_OUTPATIENT_CLINIC_OR_DEPARTMENT_OTHER): Payer: MEDICARE | Admitting: Oncology

## 2013-03-16 VITALS — BP 135/77 | HR 74 | Temp 98.6°F | Resp 20 | Ht 64.0 in | Wt 145.0 lb

## 2013-03-16 DIAGNOSIS — C50919 Malignant neoplasm of unspecified site of unspecified female breast: Secondary | ICD-10-CM

## 2013-03-16 DIAGNOSIS — C50519 Malignant neoplasm of lower-outer quadrant of unspecified female breast: Secondary | ICD-10-CM

## 2013-03-16 DIAGNOSIS — Z170 Estrogen receptor positive status [ER+]: Secondary | ICD-10-CM

## 2013-03-16 NOTE — Progress Notes (Signed)
**Note De-Identified Sandra Obfuscation** ID: Sandra Tapia   DOB: 1946/07/11  MR#: [id number]  JXB#:[id number]  PCP: Sandra Dimitri, MD GYN: Sandra Tapia SU: Sandra Tapia OTHER MD: Sandra Tapia, Sandra Tapia, Sandra Tapia   HISTORY OF PRESENT ILLNESS: She felt a soreness in her right breast, a little bit before the July 4 weekend, but on June 30 2008 particularly she palpated a mass, which was new.  She brought it to Dr. Harlene Tapia attention and she was set up for evaluation at the [facility].  Noted that she had screening mammography April 16, 2008, which found very dense breasts, but otherwise no worrisome findings.  Now, on July 6 the patient was found by Sandra Tapia to have a palpable abnormality in the upper, outer quadrant of the right breast.  By mammogram this was asymmetric with ill-defined margins and measured up to 2.1 cm.  Ultrasound showed this to measure 1.4 cm.  There was also a hypoechoic mass in the axilla measuring up to 2.6 cm, but morphologically it appeared as a normal lymph node.  Breast specific gamma imaging was performed July 8 and showed no abnormality on the left.  On the right, there was a single area of moderately intense activity measuring 1.3 cm maximally.  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  With this information the patient was referred to Sandra Tapia and bilateral breast MRIs were obtained July 22.  This showed only the solitary mass in the lower outer quadrant of the right breast measuring 1.7 cm by MRI.  Accordingly on August 4, the patient proceeded to right lumpectomy and sentinel lymph node sampling.  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 Her subsequent history is as  detailed below  INTERVAL HISTORY: Sandra Tapia returns today for followup of her breast cancer. The interval history is generally unremarkable. Since her last visit here she tapered off her venlafaxine, with no obvious change in her mood, and went off gabapentin as well, with no significant change in her sleep pattern.  REVIEW OF SYSTEMS: She is tolerating the letrozole generally quite well. She does have some pain in her lower back and hips, but this was there previously and has not increased in frequency or intensity. She has a history of aortic aneurysm which is followed by Dr. Anne Tapia. She does have symptoms of vaginal dryness which is however not a major issue for her. Hot flashes are actually improved. She has a detached retina, which is being followed. Otherwise a detailed review of systems today was noncontributory  PAST MEDICAL HISTORY: Past Medical History  Diagnosis Date  . Hypertension   . Cancer   She has history of seasonal allergies, a history of osteopenia, a history of moderate osteoarthritis for which she takes nonsteroidals twice daily, history of hyperlipidemia, history of hypertension, history of a newly found ascending aortic aneurysm measuring 4.1 cm and evaluated by Dr. Donette Tapia, history of early cataracts.  PAST SURGICAL HISTORY: Past Surgical History  Procedure Laterality Date  . Ankle surgery    . Breast surgery    . Abdominal aortic aneurysm repair    . Eye surgery    . Retinal detachment surgery  june 28th 2012  FAMILY HISTORY (updated SEPT 2013) No family history on file. The patient's parents are alive, though her father at 28 is declining.  The patient has one sister in good health.  There is no history of breast or ovarian cancer in the family except for the patient's father's sister (one of two) who was diagnosed in her 8s.  GYNECOLOGIC HISTORY: The patient is Gx, P1, first pregnancy to term age 75, last menstrual period 2003.  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  Her husband, Sandra Tapia, is retired from Administrator.  They have a daughter Sandra Tapia who works for [employer] and two grandchildren.  She lives in [place].  The patient attends [employer] in [place].   ADVANCED DIRECTIVES:  HEALTH MAINTENANCE: History  Substance Use Topics  . Smoking status: Never Smoker   . Smokeless tobacco: Not on file  . Alcohol Use: Yes     Colonoscopy:  PAP:  Bone density:  Lipid panel:  Allergies  Allergen Reactions  . Penicillins Nausea Only    Current Outpatient Prescriptions  Medication Sig Dispense Refill  . ALPHAGAN P 0.1 % SOLN       . aspirin 81 MG tablet Take 81 mg by mouth daily.      . Calcium Carbonate Antacid (TUMS ULTRA 1000 PO) Take by mouth.      . diclofenac (VOLTAREN) 75 MG EC tablet Take 75 mg by mouth 2 (two) times daily.      . fexofenadine (ALLEGRA) 180 MG tablet Take 180 mg by mouth daily.      . fish oil-omega-3 fatty acids 1000 MG capsule Take 2 g by mouth daily.      Marland Kitchen gabapentin (NEURONTIN) 300 MG capsule Take 300 mg by mouth 3 (three) times daily.      Marland Kitchen letrozole (FEMARA) 2.5 MG tablet Take 1 tablet (2.5 mg total) by mouth daily.  90 tablet  4  . lisinopril-hydrochlorothiazide (PRINZIDE,ZESTORETIC) 10-12.5 MG per tablet Take 1 tablet by mouth daily.      . Multiple Vitamin (MULTIVITAMIN) tablet Take 1 tablet by mouth daily.      . simvastatin (ZOCOR) 20 MG tablet       . venlafaxine (EFFEXOR) 37.5 MG tablet Take 1 tablet (37.5 mg total) by mouth 2 (two) times daily.  50 tablet  1  . Vitamin D, Ergocalciferol, (DRISDOL) 50000 UNITS CAPS Take 50,000 Units by mouth.       No current facility-administered medications for this visit.    OBJECTIVE: Middle-aged white woman who appears well Filed Vitals:   03/16/13 1410  BP: 135/77  Pulse: 74  Temp: 98.6 F (37 C)  Resp: 20     Body mass index is 24.88 kg/(m^2).     ECOG FS: 0  Sclerae unicteric Oropharynx clear No cervical or supraclavicular adenopathy Lungs no rales or rhonchi Heart regular rate and rhythm Abd soft, nontender, positive bowel sounds MSK no focal spinal tenderness, no peripheral edema Neuro: nonfocal, well oriented, appropriate affect Breasts: The right breast is status post lumpectomy and radiation. It is slightly smaller than the left. There is no evidence of local recurrence. The left breast is unremarkable. Both axillae are benign  LAB RESULTS: Lab Results  Component Value Date   WBC 5.5 09/07/2012   NEUTROABS 3.3 09/07/2012   HGB 13.3 09/07/2012   HCT 39.6 09/07/2012   MCV 94.7 09/07/2012   PLT 217  09/07/2012      Chemistry      Component Value Date/Time   NA 139 09/07/2012 1106   NA 138 09/11/2011 1353   K 4.6 09/07/2012 1106   K 4.0 09/11/2011 1353   CL 102 09/07/2012 1106   CL 101 09/11/2011 1353   CO2 24 09/07/2012 1106   CO2 28 09/11/2011 1353   BUN 11.0 09/07/2012 1106   BUN 17 09/11/2011 1353   CREATININE 0.8 09/07/2012 1106   CREATININE 0.85 09/11/2011 1353      Component Value Date/Time   CALCIUM 10.1 09/07/2012 1106   CALCIUM 9.6 09/11/2011 1353   ALKPHOS 42 09/07/2012 1106   ALKPHOS 38* 09/11/2011 1353   AST 46* 09/07/2012 1106   AST 32 09/11/2011 1353   ALT 41 09/07/2012 1106   ALT 27 09/11/2011 1353   BILITOT 1.00 09/07/2012 1106   BILITOT 0.6 09/11/2011 1353       Lab Results  Component Value Date   LABCA2 11 08/28/2010    No components found with this basename: [id number]    No results found for this basename: INR,  in the last 168 hours  Urinalysis    Component Value Date/Time   COLORURINE YELLOW 07/26/2008 1145   APPEARANCEUR CLEAR 07/26/2008 1145   LABSPEC 1.008 07/26/2008 1145   PHURINE 7.5 07/26/2008 1145   GLUCOSEU NEGATIVE 07/26/2008 1145   HGBUR NEGATIVE 07/26/2008 1145   BILIRUBINUR NEGATIVE 07/26/2008 1145   KETONESUR NEGATIVE 07/26/2008 1145   PROTEINUR NEGATIVE 07/26/2008 1145   UROBILINOGEN 0.2  07/26/2008 1145   NITRITE NEGATIVE 07/26/2008 1145   LEUKOCYTESUR NEGATIVE MICROSCOPIC NOT DONE ON URINES WITH NEGATIVE PROTEIN, BLOOD, LEUKOCYTES, NITRITE, OR GLUCOSE <1000 mg/dL. 07/26/2008 1145    STUDIES: No results found. .  ASSESSMENT: 67 y.o. Sandra Tapia woman, status post right lumpectomy and sentinel lymph node sampling August 2009 for a T1c N0, stage IA invasive ductal carcinoma, grade 1, which was strongly estrogen and progesterone receptor positive, HER-2-negative, with a low proliferation fraction  (1) adjuvant radiation completed October 2009,  (2) on tamoxifen between October of 2009 and October of 2011  (3) started letrozole a January 2014  PLAN:  Kamani is doing much better on the letrozole, and the plan will be to continue that an additional 3 years. She sees Dr. Uvaldo Rising in January and July, Dr. Phylliss Bob mind in may, and she usually has her mammogram in August. If she sees Sandra Tapia in September, she can see me in March on a once a year basis. She will need a bone scan before her next visit here and she will discuss this with her primary care physician. Otherwise Mika knows to call for any problems that may develop before the next visit here.  Malikhi Ogan C    03/16/2013

## 2013-03-16 NOTE — Telephone Encounter (Signed)
gv pt appt schedule for March 2015.  °

## 2013-03-23 ENCOUNTER — Encounter (INDEPENDENT_AMBULATORY_CARE_PROVIDER_SITE_OTHER): Payer: Self-pay | Admitting: General Surgery

## 2013-07-03 ENCOUNTER — Encounter: Payer: Self-pay | Admitting: *Deleted

## 2013-07-03 ENCOUNTER — Encounter: Payer: Self-pay | Admitting: Obstetrics and Gynecology

## 2013-07-04 ENCOUNTER — Encounter: Payer: Self-pay | Admitting: Obstetrics and Gynecology

## 2013-07-04 ENCOUNTER — Ambulatory Visit (INDEPENDENT_AMBULATORY_CARE_PROVIDER_SITE_OTHER): Payer: Medicare Other | Admitting: Obstetrics and Gynecology

## 2013-07-04 VITALS — BP 100/62 | Ht 63.25 in | Wt 145.0 lb

## 2013-07-04 DIAGNOSIS — Z01419 Encounter for gynecological examination (general) (routine) without abnormal findings: Secondary | ICD-10-CM

## 2013-07-04 MED ORDER — VITAMIN D (ERGOCALCIFEROL) 1.25 MG (50000 UNIT) PO CAPS: 50000.0000 [IU] | ORAL_CAPSULE | ORAL | Status: DC

## 2013-07-04 NOTE — Progress Notes (Signed)
67 y.o.   Married    Caucasian   female   G1P1   here for annual exam.  No new health hx.  Seeing MD at [facility] f/u detached retina  No LMP recorded. Patient is postmenopausal.          Sexually active: yes  The current method of family planning is post menopausal status.    Exercising: not now Last mammogram:  08/02/2012 Benign 3-D Last pap smear:06/09/11 neg History of abnormal pap: no Smoking:never Alcohol:6-7 glasses of wine a week Last colonoscopy:2008 normal, repeat in 10years Last Bone Density: 07/11/10 osteopenic Lt femoral neck, Lt Hip  Last tetanus shot:less than 10 years Last cholesterol check: 06/27/13  Hgb:  pcp              Urine: pcp   Family History  Problem Relation Age of Onset  . Hypertension Father   . Macular degeneration Father     Patient Active Problem List   Diagnosis Date Noted  . Breast cancer 09/15/2012    Past Medical History  Diagnosis Date  . Hypertension   . Elevated cholesterol   . Infertility, female   . Fibroid   . Cancer 07/2008    breast cancer(rt)  . Basal cell cancer   . Degenerative disc disease     neck  . Spinal arthritis   . Aneurysm of aorta 8/09    4.1cm ascending aortic aneurysm on breast MRI    Past Surgical History  Procedure Laterality Date  . Ankle surgery    . Eye surgery    . Retinal detachment surgery  june 28th 2012  . Tonsillectomy and adenoidectomy    . Breast surgery  8/09    (Rt)Lumpectomy,sentinel node  . Mastectomy, partial Right 07/31/08    right node Bx (neg)    Allergies: Penicillins  Current Outpatient Prescriptions  Medication Sig Dispense Refill  . ALPHAGAN P 0.1 % SOLN       . aspirin 81 MG tablet Take 81 mg by mouth daily.      . Calcium Carbonate Antacid (TUMS ULTRA 1000 PO) Take by mouth.      . diclofenac (VOLTAREN) 75 MG EC tablet Take 75 mg by mouth 2 (two) times daily.      . fexofenadine (ALLEGRA) 180 MG tablet Take 180 mg by mouth daily.      . fish oil-omega-3 fatty acids 1000 MG  capsule Take 2 g by mouth daily.      Marland Kitchen letrozole (FEMARA) 2.5 MG tablet Take 1 tablet (2.5 mg total) by mouth daily.  90 tablet  4  . lisinopril-hydrochlorothiazide (PRINZIDE,ZESTORETIC) 10-12.5 MG per tablet Take 1 tablet by mouth daily.      . Multiple Vitamin (MULTIVITAMIN) tablet Take 1 tablet by mouth daily.      Marland Kitchen NEVANAC 0.1 % ophthalmic suspension       . simvastatin (ZOCOR) 20 MG tablet       . tretinoin (RETIN-A) 0.025 % cream       . Vitamin D, Ergocalciferol, (DRISDOL) 50000 UNITS CAPS Take 50,000 Units by mouth every 14 (fourteen) days.        No current facility-administered medications for this visit.    ROS: Pertinent items are noted in HPI.  Social Hx: married, one child    Exam:    BP 100/62  Ht 5' 3.25" (1.607 m)  Wt 145 lb (65.772 kg)  BMI 25.47 kg/m2  Down 2 pounds since last year Wt Readings  from Last 3 Encounters:  07/04/13 145 lb (65.772 kg)  03/16/13 145 lb (65.772 kg)  09/15/12 145 lb 4.8 oz (65.908 kg)     Ht Readings from Last 3 Encounters:  07/04/13 5' 3.25" (1.607 m)  03/16/13 5\' 4"  (1.626 m)  09/15/12 5\' 4"  (1.626 m)    General appearance: alert, cooperative and appears stated age Head: Normocephalic, without obvious abnormality, atraumatic Neck: no adenopathy, supple, symmetrical, trachea midline and thyroid not enlarged, symmetric, no tenderness/mass/nodules Lungs: clear to auscultation bilaterally Breasts: Inspection negative, No nipple retraction or dimpling, No nipple discharge or bleeding, No axillary or supraclavicular adenopathy, Normal to palpation without dominant masses Heart: regular rate and rhythm Abdomen: soft, non-tender; bowel sounds normal; no masses,  no organomegaly Extremities: extremities normal, atraumatic, no cyanosis or edema Skin: Skin color, texture, turgor normal. No rashes or lesions Lymph nodes: Cervical, supraclavicular, and axillary nodes normal. No abnormal inguinal nodes palpated Neurologic: Grossly  normal   Pelvic: External genitalia:  no lesions              Urethra:  normal appearing urethra with no masses, tenderness or lesions              Bartholins and Skenes: normal                 Vagina: normal appearing vagina with normal color and discharge, no lesions              Cervix: normal appearance              Pap taken: no        Bimanual Exam:  Uterus:  uterus is normal size, shape, consistency and nontender                                      Adnexa: normal adnexa in size, nontender and no masses                                      Rectovaginal: Confirms                                      Anus:  normal sphincter tone, no lesions  A: normal menopausal exam, no HRT     Right breast cancer 2009, lumpectomy, sentinel node neg, RT, tamox since Oct 2009 until Sept 2013, now on femara     4 cm asceding aortic aneurysm on breast MRI - stable in size - has f/u July 21st.       Detached retina, tx'd at [facility] with laser     P: mammogram counseled on breast self exam, mammography screening, adequate intake of calcium and vitamin D, diet and exercise return annually or prn     An After Visit Summary was printed and given to the patient.

## 2013-07-04 NOTE — Patient Instructions (Addendum)

## 2013-08-11 ENCOUNTER — Encounter (INDEPENDENT_AMBULATORY_CARE_PROVIDER_SITE_OTHER): Payer: Self-pay

## 2013-09-21 ENCOUNTER — Encounter (INDEPENDENT_AMBULATORY_CARE_PROVIDER_SITE_OTHER): Payer: Self-pay | Admitting: General Surgery

## 2013-09-21 ENCOUNTER — Ambulatory Visit (INDEPENDENT_AMBULATORY_CARE_PROVIDER_SITE_OTHER): Payer: Medicare Other | Admitting: General Surgery

## 2013-09-21 VITALS — BP 114/74 | HR 76 | Temp 99.2°F | Resp 14 | Ht 63.0 in | Wt 144.4 lb

## 2013-09-21 DIAGNOSIS — C50919 Malignant neoplasm of unspecified site of unspecified female breast: Secondary | ICD-10-CM

## 2013-09-21 DIAGNOSIS — C50911 Malignant neoplasm of unspecified site of right female breast: Secondary | ICD-10-CM

## 2013-09-21 NOTE — Patient Instructions (Signed)
You appear to be doing well. Your physical exam today reveals that the breasts and the lymph node areas were all normal. Your recent mammograms are all normal. There is no evidence of cancer.  Continue to take the antiestrogen medication and continue your regular followups with Dr. Darnelle Catalan.  We discussed long-term surveillance, and we decided That you will  graduate from my care today. Please return to see me if further problems arise.

## 2013-09-21 NOTE — Progress Notes (Signed)
Patient ID: Sandra Tapia, female   DOB: 02-Oct-1946, 67 y.o.   MRN: [id number]  History: Sandra Tapia is a 67 y.o. female. She returns for long-term followup of her right breast cancer.  This patient underwent right partial mastectomy and sentinel lymph node biopsy on August 01, 1999. Pathologically she has a T1c., N0, receptor positive, HER-2-negative, Ki67 99% tumor. She had radiation therapy and is on adjuvant Femara. She has no evidence of recurrence to date.  Last mammogram on 08/08/2013. Category 2. No focal abnormality.  She feels fine.  She continues to note that the right breast is a little bit smaller than the left, but she is not mentioned in pedicle flap reconstruction.  ROS: 10 system review of systems is negative except as described above  Exam: Patient looks well. No distress. Family is with her Neck reveals no adenopathy or mass Lungs are clear to auscultation bilaterally Heart regular rate and rhythm. No murmur. No ectopy. Breasts well-healed transverse incision lateral right breast and right axillary incision. Right breast is 10-15% smaller than the right. Otherwise contour and nipple projection is good. There is no mass in either breast. No other skin changes. No axillary adenopathy.  Assessment: Invasive ductal carcinoma right breast, lower outer quadrant, stage TI C., N0, receptor positive, HER-2-negative, Ki-67 99%.  No evidence of recurrence 4 years following right partial mastectomy and sentinel node biopsy.  Mild cosmetic deformity of right breast, volume loss only, otherwise contoured and nipple projection good  Plan: Continue Femara and followup with Dr. Darnelle Catalan. Continue primary care followup with Dr. Selena Batten repeat mammograms in 1 year We talked about long-term surveillance and the number of physicians that would be required to follow her. She stated that she plans to continue to follow Dr. Darnelle Catalan for several years because of the antiestrogen  therapy. We decided she would graduate from my care and routine followup. She was advised to return to see me if further problems arise.   Angelia Mould. Derrell Lolling, M.D., [facility] General and Minimally invasive Surgery Breast and Colorectal Surgery Office:   [phone] Pager:   [phone]

## 2013-12-03 ENCOUNTER — Other Ambulatory Visit: Payer: Self-pay | Admitting: Oncology

## 2013-12-05 ENCOUNTER — Other Ambulatory Visit: Payer: Self-pay | Admitting: Oncology

## 2014-01-15 ENCOUNTER — Other Ambulatory Visit: Payer: Self-pay | Admitting: Family Medicine

## 2014-01-15 ENCOUNTER — Ambulatory Visit
Admission: RE | Admit: 2014-01-15 | Discharge: 2014-01-15 | Disposition: A | Discharge status: Home / Self Care | Payer: Medicare Other | Source: Ambulatory Visit | Attending: Family Medicine | Admitting: Family Medicine

## 2014-01-15 DIAGNOSIS — M542 Cervicalgia: Secondary | ICD-10-CM

## 2014-01-16 ENCOUNTER — Other Ambulatory Visit: Payer: Self-pay | Admitting: Family Medicine

## 2014-01-16 DIAGNOSIS — M545 Low back pain, unspecified: Secondary | ICD-10-CM

## 2014-01-19 ENCOUNTER — Other Ambulatory Visit (HOSPITAL_COMMUNITY): Payer: MEDICARE

## 2014-01-19 ENCOUNTER — Encounter: Payer: Self-pay | Admitting: Cardiology

## 2014-01-19 ENCOUNTER — Other Ambulatory Visit (HOSPITAL_COMMUNITY): Payer: Self-pay | Admitting: Cardiology

## 2014-01-19 ENCOUNTER — Encounter (INDEPENDENT_AMBULATORY_CARE_PROVIDER_SITE_OTHER): Payer: Self-pay

## 2014-01-19 ENCOUNTER — Ambulatory Visit (HOSPITAL_COMMUNITY): Payer: MEDICARE | Attending: Cardiology | Admitting: Radiology

## 2014-01-19 DIAGNOSIS — I712 Thoracic aortic aneurysm, without rupture, unspecified: Secondary | ICD-10-CM

## 2014-01-19 DIAGNOSIS — I079 Rheumatic tricuspid valve disease, unspecified: Secondary | ICD-10-CM | POA: Insufficient documentation

## 2014-01-19 DIAGNOSIS — Z853 Personal history of malignant neoplasm of breast: Secondary | ICD-10-CM | POA: Insufficient documentation

## 2014-01-19 DIAGNOSIS — I080 Rheumatic disorders of both mitral and aortic valves: Secondary | ICD-10-CM | POA: Insufficient documentation

## 2014-01-19 NOTE — Progress Notes (Signed)
Echocardiogram performed.  

## 2014-01-22 ENCOUNTER — Ambulatory Visit
Admission: RE | Admit: 2014-01-22 | Discharge: 2014-01-22 | Disposition: A | Discharge status: Home / Self Care | Payer: Medicare Other | Source: Ambulatory Visit | Attending: Family Medicine | Admitting: Family Medicine

## 2014-01-22 DIAGNOSIS — M545 Low back pain, unspecified: Secondary | ICD-10-CM

## 2014-02-05 ENCOUNTER — Encounter: Payer: Self-pay | Admitting: Physical Medicine & Rehabilitation

## 2014-03-02 ENCOUNTER — Other Ambulatory Visit: Payer: Self-pay | Admitting: Physician Assistant

## 2014-03-08 ENCOUNTER — Other Ambulatory Visit (HOSPITAL_BASED_OUTPATIENT_CLINIC_OR_DEPARTMENT_OTHER): Payer: MEDICARE

## 2014-03-08 DIAGNOSIS — C50919 Malignant neoplasm of unspecified site of unspecified female breast: Secondary | ICD-10-CM

## 2014-03-08 DIAGNOSIS — C50519 Malignant neoplasm of lower-outer quadrant of unspecified female breast: Secondary | ICD-10-CM

## 2014-03-08 LAB — CBC WITH DIFFERENTIAL/PLATELET
BASO%: 0.6 % (ref 0.0–2.0)
Basophils Absolute: 0 10*3/uL (ref 0.0–0.1)
EOS%: 1.8 % (ref 0.0–7.0)
Eosinophils Absolute: 0.1 10*3/uL (ref 0.0–0.5)
HCT: 41.3 % (ref 34.8–46.6)
HGB: 13.8 g/dL (ref 11.6–15.9)
LYMPH%: 19.7 % (ref 14.0–49.7)
MCH: 32.1 pg (ref 25.1–34.0)
MCHC: 33.3 g/dL (ref 31.5–36.0)
MCV: 96.3 fL (ref 79.5–101.0)
MONO#: 0.7 10*3/uL (ref 0.1–0.9)
MONO%: 8.3 % (ref 0.0–14.0)
NEUT#: 5.6 10*3/uL (ref 1.5–6.5)
NEUT%: 69.6 % (ref 38.4–76.8)
PLATELETS: 236 10*3/uL (ref 145–400)
RBC: 4.29 10*6/uL (ref 3.70–5.45)
RDW: 13.1 % (ref 11.2–14.5)
WBC: 8 10*3/uL (ref 3.9–10.3)
lymph#: 1.6 10*3/uL (ref 0.9–3.3)

## 2014-03-08 LAB — COMPREHENSIVE METABOLIC PANEL (CC13)
ALK PHOS: 57 U/L (ref 40–150)
ALT: 30 U/L (ref 0–55)
AST: 28 U/L (ref 5–34)
Albumin: 4.4 g/dL (ref 3.5–5.0)
Anion Gap: 12 mEq/L — ABNORMAL HIGH (ref 3–11)
BILIRUBIN TOTAL: 1.26 mg/dL — AB (ref 0.20–1.20)
BUN: 12.2 mg/dL (ref 7.0–26.0)
CO2: 27 mEq/L (ref 22–29)
Calcium: 10.7 mg/dL — ABNORMAL HIGH (ref 8.4–10.4)
Chloride: 101 mEq/L (ref 98–109)
Creatinine: 0.8 mg/dL (ref 0.6–1.1)
Glucose: 98 mg/dl (ref 70–140)
Potassium: 4 mEq/L (ref 3.5–5.1)
SODIUM: 140 meq/L (ref 136–145)
TOTAL PROTEIN: 7.7 g/dL (ref 6.4–8.3)

## 2014-03-09 ENCOUNTER — Other Ambulatory Visit: Payer: Self-pay | Admitting: Oncology

## 2014-03-13 ENCOUNTER — Encounter: Payer: Self-pay | Admitting: Physical Medicine & Rehabilitation

## 2014-03-13 ENCOUNTER — Ambulatory Visit (HOSPITAL_BASED_OUTPATIENT_CLINIC_OR_DEPARTMENT_OTHER): Payer: MEDICARE | Admitting: Physical Medicine & Rehabilitation

## 2014-03-13 ENCOUNTER — Encounter (INDEPENDENT_AMBULATORY_CARE_PROVIDER_SITE_OTHER): Payer: Self-pay

## 2014-03-13 ENCOUNTER — Encounter: Payer: MEDICARE | Attending: Physical Medicine & Rehabilitation

## 2014-03-13 VITALS — BP 151/84 | HR 64 | Resp 14 | Ht 63.0 in | Wt 146.0 lb

## 2014-03-13 DIAGNOSIS — Z5181 Encounter for therapeutic drug level monitoring: Secondary | ICD-10-CM

## 2014-03-13 DIAGNOSIS — G8929 Other chronic pain: Secondary | ICD-10-CM

## 2014-03-13 DIAGNOSIS — M47817 Spondylosis without myelopathy or radiculopathy, lumbosacral region: Secondary | ICD-10-CM

## 2014-03-13 DIAGNOSIS — M47812 Spondylosis without myelopathy or radiculopathy, cervical region: Secondary | ICD-10-CM

## 2014-03-13 DIAGNOSIS — M4120 Other idiopathic scoliosis, site unspecified: Secondary | ICD-10-CM | POA: Insufficient documentation

## 2014-03-13 DIAGNOSIS — Z79899 Other long term (current) drug therapy: Secondary | ICD-10-CM

## 2014-03-13 DIAGNOSIS — M542 Cervicalgia: Secondary | ICD-10-CM | POA: Insufficient documentation

## 2014-03-13 DIAGNOSIS — Z853 Personal history of malignant neoplasm of breast: Secondary | ICD-10-CM | POA: Insufficient documentation

## 2014-03-13 DIAGNOSIS — M549 Dorsalgia, unspecified: Secondary | ICD-10-CM

## 2014-03-13 DIAGNOSIS — M5030 Other cervical disc degeneration, unspecified cervical region: Secondary | ICD-10-CM | POA: Insufficient documentation

## 2014-03-13 DIAGNOSIS — I10 Essential (primary) hypertension: Secondary | ICD-10-CM | POA: Insufficient documentation

## 2014-03-13 MED ORDER — TRAMADOL-ACETAMINOPHEN 37.5-325 MG PO TABS: 1.0000 | ORAL_TABLET | Freq: Three times a day (TID) | ORAL | Status: DC | PRN

## 2014-03-13 NOTE — Patient Instructions (Signed)
Therapy at [facility] Medial branch blocks next visit need driver Ultracet for pain in place of anti-inflammatory medication

## 2014-03-13 NOTE — Progress Notes (Signed)
Subjective:    Patient ID: Sandra Tapia, female    DOB: 1946/07/03, 68 y.o.   MRN: [id number]  HPI Chief complaint is neck pain and back pain  Back pain is about 8-9 years onset.No traumatic event Had seen physical medicine and rehabilitation  in the past. Patient had radiofrequency procedure, ~8 yrs ago was going to repeat after 18 months but required treatment for breast CA  Neck pain had initial onset approximate 17 years ago but was treated conservatively with ibuprofen and proper positioning. It has more recently increased over last year. No recent trauma.  No sciatic pain however does have some radiation of pain from the back toward the hips.  No neck pain that radiates into the hands.  Has occasional temporary numbness in the upper trapezius area with sitting  Poor standing tolerance  Repeat MRI lumbar spine showing both lumbar degenerative disc at L2-3 L3-4 L4-5 L5-S1 as well as lumbar facet arthropathy at corresponding levels. Possible encroachment left L4  Reviewed cervical spine x-ray demonstrating decreased disc space C4-C5 C5-C6 C6-C7 as well as spondylosis at those levels. No significant foraminal stenosis in the cervical spine Pain Inventory Average Pain 7 Pain Right Now 7 My pain is sharp, stabbing and aching  In the last 24 hours, has pain interfered with the following? General activity 0 Relation with others 0 Enjoyment of life 0 What TIME of day is your pain at its worst? n/a Sleep (in general) Fair  Pain is worse with: standing and some activites Pain improves with: pacing activities and medication Relief from Meds: 5  Mobility how many minutes can you walk? 20-30 ability to climb steps?  yes do you drive?  yes Do you have any goals in this area?  yes  Function not employed: date last employed 05/1999 retired I need assistance with the following:  household duties and shopping  Neuro/Psych trouble walking  Prior Studies bone  scan x-rays CT/MRI  Physicians involved in your care Oncologist, PCP, opthalmolist   Family History  Problem Relation Age of Onset  . Hypertension Father   . Macular degeneration Father    History   Social History  . Marital Status: Married    Spouse Name: N/A    Number of Children: N/A  . Years of Education: N/A   Social History Main Topics  . Smoking status: Never Smoker   . Smokeless tobacco: Never Used  . Alcohol Use: 3.5 oz/week    7 drink(s) per week     Comment: 6-7 glasses of wine a week  . Drug Use: No  . Sexual Activity: Yes    Partners: Male    Birth Control/ Protection: Post-menopausal   Other Topics Concern  . None   Social History Narrative  . None   Past Surgical History  Procedure Laterality Date  . Ankle surgery    . Eye surgery    . Retinal detachment surgery  june 28th 2012  . Tonsillectomy and adenoidectomy    . Breast surgery  8/09    (Rt)Lumpectomy,sentinel node  . Mastectomy, partial Right 07/31/08    right node Bx (neg)   Past Medical History  Diagnosis Date  . Hypertension   . Elevated cholesterol   . Infertility, female   . Fibroid   . Cancer 07/2008    breast cancer(rt)  . Basal cell cancer   . Degenerative disc disease     neck  . Spinal arthritis   . Aneurysm of aorta 8/09  4.1cm ascending aortic aneurysm on breast MRI   BP 151/84  Pulse 64  Resp 14  Ht 5\' 3"  (1.6 m)  Wt 146 lb (66.225 kg)  BMI 25.87 kg/m2  SpO2 97%  Opioid Risk Score: 0 Fall Risk Score: Low Fall Risk (0-5 points) (patient educated handout given)   Review of Systems  Constitutional: Positive for diaphoresis.  Musculoskeletal: Positive for back pain, gait problem and neck pain.  All other systems reviewed and are negative.       Objective:   Physical Exam  Nursing note and vitals reviewed. Constitutional: She appears well-developed.  Musculoskeletal:       Cervical back: She exhibits decreased range of motion and tenderness.        Thoracic back: She exhibits deformity.       Lumbar back: She exhibits deformity.  Levoconvex the thoraco lumbar scoliosis  Right suboccipital tenderness  No tenderness in the thoracic or lumbar paraspinal muscles.  Psychiatric: She has a normal mood and affect.   Motor strength is 5/5 in bilateral deltoid, bicep, tricep, grip, hip flexor, knee extensors, ankle dorsiflexor plantar flexor Straight leg raising test is negative Sensation to pinprick is normal        Assessment & Plan:  1. 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 Has had good results with radio frequency neuropathy in the past. This would be consistent with lumbar spondylosis as the primary pain generator. Since it has been greater than 8 years since she has had this procedure will need to perform diagnostic/therapeutic lumbar medial branch blocks to further evaluate. We'll send to physical therapy since the patient requires lumbar and cervical thoracic stabilization program and transition to her community-based Pilates type program  2. Cervical spondylosis and cervical degenerative disc mainly at C4-5 C5 6 and C6-C7. Would start with physical therapy and if not helpful consider cervical medial branch blocks  We'll trial Ultracet for pain, avoid narcotic analgesic

## 2014-03-15 ENCOUNTER — Encounter: Payer: Self-pay | Admitting: Hematology and Oncology

## 2014-03-15 ENCOUNTER — Telehealth: Payer: Self-pay | Admitting: Oncology

## 2014-03-15 ENCOUNTER — Other Ambulatory Visit: Payer: Self-pay | Admitting: Oncology

## 2014-03-15 ENCOUNTER — Other Ambulatory Visit: Payer: Self-pay

## 2014-03-15 ENCOUNTER — Ambulatory Visit (HOSPITAL_BASED_OUTPATIENT_CLINIC_OR_DEPARTMENT_OTHER): Payer: MEDICARE | Admitting: Hematology and Oncology

## 2014-03-15 ENCOUNTER — Ambulatory Visit: Payer: MEDICARE | Admitting: Physician Assistant

## 2014-03-15 VITALS — BP 148/87 | HR 69 | Temp 98.2°F | Resp 18 | Ht 63.0 in | Wt 145.6 lb

## 2014-03-15 DIAGNOSIS — C50919 Malignant neoplasm of unspecified site of unspecified female breast: Secondary | ICD-10-CM

## 2014-03-15 DIAGNOSIS — M949 Disorder of cartilage, unspecified: Secondary | ICD-10-CM

## 2014-03-15 DIAGNOSIS — M899 Disorder of bone, unspecified: Secondary | ICD-10-CM

## 2014-03-15 DIAGNOSIS — M542 Cervicalgia: Secondary | ICD-10-CM

## 2014-03-15 NOTE — Telephone Encounter (Signed)
, °

## 2014-03-16 ENCOUNTER — Encounter: Payer: Self-pay | Admitting: Hematology and Oncology

## 2014-03-16 ENCOUNTER — Other Ambulatory Visit: Payer: Self-pay

## 2014-03-16 ENCOUNTER — Telehealth: Payer: Self-pay

## 2014-03-16 DIAGNOSIS — C50919 Malignant neoplasm of unspecified site of unspecified female breast: Secondary | ICD-10-CM

## 2014-03-16 MED ORDER — LETROZOLE 2.5 MG PO TABS: 2.5000 mg | ORAL_TABLET | Freq: Every day | ORAL | Status: DC

## 2014-03-16 NOTE — Telephone Encounter (Signed)
Patient calling inquiring if she could get a refill on her Femara, States she forgot to ask for refill when she came in for her appointment yesterday. Verified patients pharmacy. Clarified orders with Dr.Faidas to refill patients medications for 90 days with 3 refills. Informed patient prescription was sent to her pharmacy.  Patient verbalized understanding, denies any questions or concerns at this time. Informed patient to call back with any concerns.

## 2014-03-16 NOTE — Progress Notes (Signed)
. [name]   DOB: [birth year]  MR#: [id number]  CSN#:[id number]  PCP: Cari Caraway, MD GYN: C. Romine SU: Leane Para OTHER MD: [place], Mark Skains   HISTORY OF PRESENT ILLNESS: Sandra Tapia felt a soreness in Sandra Tapia right breast, a little bit before the July 4 weekend, but on June 30 2008 particularly Sandra Tapia palpated a mass, which was new.  Sandra Tapia brought it to Dr. Julieta Bellini attention and Sandra Tapia was set up for evaluation at the [facility].  Noted that Sandra Tapia had screening mammography April 16, 2008, which found very dense breasts, but otherwise no worrisome findings.  Now, on July 6 the Sandra Tapia was found by Dr. Truddie Coco to have a palpable abnormality in the upper, outer quadrant of the right breast.  By mammogram this was asymmetric with ill-defined margins and measured up to 2.1 cm.  Ultrasound showed this to measure 1.4 cm.  There was also a hypoechoic mass in the axilla measuring up to 2.6 cm, but morphologically it appeared as a normal lymph node.  Breast specific gamma imaging was performed July 8 and showed no abnormality on the left.  On the right, there was a single area of moderately intense activity measuring 1.3 cm maximally.  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  With this information the Sandra Tapia was referred to Dr. Dalbert Batman and bilateral breast MRIs were obtained July 22.  This showed only the solitary mass in the lower outer quadrant of the right breast measuring 1.7 cm by MRI.  Accordingly on August 4, the Sandra Tapia proceeded to right lumpectomy and sentinel lymph node sampling.  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 Sandra Tapia subsequent history is  as detailed below  INTERVAL HISTORY: Sandra Tapia returns today for followup of Sandra Tapia breast cancer.Sandra Tapia is overall doing well.Sandra Tapia takes Letrozole without new or significant side effects.Sandra Tapia reports neck pain  and stiffness and Sandra Tapia sees a Restaurant manager, fast food, feeling better.Sandra Tapia had  Cervical spine xrays. Sandra Tapia reports rather chronic without significant change back pain and had MRI of the Lumbar spine.   REVIEW OF SYSTEMS: Sandra Tapia does have some pain in Sandra Tapia lower back and hips, but this was there previously and has not increased in frequency or intensity. Sandra Tapia has a history of aortic aneurysm which is followed by Dr. Marlou Porch. Sandra Tapia have symptoms of vaginal dryness Hot flashes areimproved. 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 Otherwise a detailed review of systems today was noncontributory  PAST MEDICAL HISTORY: Past Medical History  Diagnosis Date  . Hypertension   . Elevated cholesterol   . Infertility, female   . Fibroid   . Cancer 07/2008    breast cancer(rt)  . Basal cell cancer   . Degenerative disc disease     neck  . Spinal arthritis   . Aneurysm of aorta 8/09    4.1cm ascending aortic aneurysm on breast MRI  Sandra Tapia has history of seasonal allergies, a history of osteopenia, a history of moderate osteoarthritis for which Sandra Tapia takes nonsteroidals twice daily, history of hyperlipidemia, history of hypertension, history of a newly found ascending aortic aneurysm measuring 4.1 cm and evaluated by Dr. Lysle Rubens, history of early cataracts.  PAST SURGICAL HISTORY: Past Surgical History  Procedure Laterality Date  . Ankle surgery    . Eye surgery    . Retinal detachment surgery  june 28th 2012  . Tonsillectomy and adenoidectomy    . Breast surgery  8/09    (Rt)Lumpectomy,sentinel node  . Mastectomy, partial Right 07/31/08    right node Bx (neg)    FAMILY HISTORY (updated SEPT 2013) Family History  Problem Relation Age of Onset  . Hypertension Father   . Macular degeneration Father    The  Sandra Tapia's parents are alive, though Sandra Tapia father at 47 is declining.  The Sandra Tapia has one sister in good health.  There is no history of breast or ovarian cancer in the family except for the Sandra Tapia's father's sister (one of two) who was diagnosed in Sandra Tapia 34s.  GYNECOLOGIC HISTORY: The Sandra Tapia is Gx, P1, first pregnancy to term age 30, last menstrual period 2003.  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  Sandra Tapia husband, [name], is retired from Chiropractor.  They have a daughter [name] who works for [employer] and two grandchildren.  Sandra Tapia lives in [place].  The Sandra Tapia attends [employer] in [place].   ADVANCED DIRECTIVES:  HEALTH MAINTENANCE: History  Substance Use Topics  . Smoking status: Never Smoker   . Smokeless tobacco: Never Used  . Alcohol Use: 3.5 oz/week    7 drink(s) per week     Comment: 6-7 glasses of wine a week     Colonoscopy:  PAP:  Bone density:  Lipid panel:  Allergies  Allergen Reactions  . Penicillins Nausea Only    Current Outpatient Prescriptions  Medication Sig Dispense Refill  . alendronate (FOSAMAX) 10 MG tablet Take 10 mg by mouth daily before breakfast. Take with a full glass of water on an empty stomach.      . ALPHAGAN P 0.1 % SOLN       . aspirin 81 MG tablet Take 81 mg by mouth daily.      . Biotin 10 MG CAPS 1 tablet      . Calcium Carbonate Antacid (TUMS ULTRA 1000 PO) Take by mouth.      . diclofenac (VOLTAREN) 75 MG EC tablet Take 75 mg by mouth 2 (two) times daily.      . fexofenadine (ALLEGRA) 180 MG tablet Take 180 mg by mouth daily.      . fish oil-omega-3 fatty acids 1000 MG capsule Take 2 g by mouth daily.      Marland Kitchen ketorolac (ACULAR) 0.4 % SOLN 1 drop 4 (four) times daily.      Marland Kitchen letrozole (FEMARA) 2.5 MG tablet TAKE ONE TABLET BY MOUTH EVERY DAY  90 tablet  0  . lisinopril-hydrochlorothiazide (PRINZIDE,ZESTORETIC) 10-12.5 MG per tablet Take  1 tablet by mouth daily.      . Misc Natural Products (TART CHERRY ADVANCED PO) Take by mouth.      . Multiple Vitamin (MULTIVITAMIN) tablet Take 1 tablet by mouth daily.      . simvastatin (ZOCOR) 20 MG tablet       . traMADol-acetaminophen (ULTRACET) 37.5-325 MG per tablet Take 1 tablet by mouth every 8 (eight) hours as needed.  90 tablet  0  . tretinoin (RETIN-A) 0.025 % cream       . Vitamin D, Ergocalciferol, (DRISDOL) 50000 UNITS CAPS Take 1 capsule (50,000 Units total) by mouth every 14 (fourteen) days.  26 capsule  0   No current facility-administered medications for this visit.    OBJECTIVE: Middle-aged white woman who appears well Filed Vitals:   03/15/14 1452  BP: 148/87  Pulse: 69  Temp: 98.2 F (36.8 C)  Resp: 18     Body mass index is 25.8 kg/(m^2).    ECOG FS: 0  Sclerae unicteric Oropharynx clear No cervical or supraclavicular adenopathy Lungs no rales or rhonchi Heart regular rate and rhythm Abd soft, nontender, positive bowel sounds MSK no focal spinal tenderness, no peripheral edema,posterior neck fulness with limited range of motion. Neuro: nonfocal, well oriented, appropriate affect Breasts: The right breast is status post lumpectomy and radiation. It is slightly smaller than the left. There is no evidence of local recurrence. The left breast is unremarkable. Both axillae are benign  LAB RESULTS: Lab Results  Component Value Date   WBC 8.0 03/08/2014   NEUTROABS 5.6 03/08/2014   HGB 13.8 03/08/2014   HCT 41.3 03/08/2014   MCV 96.3 03/08/2014   PLT 236 03/08/2014      Chemistry      Component Value Date/Time   NA 140 03/08/2014 1401   NA 138 09/11/2011 1353   K 4.0 03/08/2014 1401   K 4.0 09/11/2011 1353   CL 102 09/07/2012 1106   CL 101 09/11/2011 1353   CO2 27 03/08/2014 1401   CO2 28 09/11/2011 1353   BUN 12.2 03/08/2014 1401   BUN 17 09/11/2011 1353   CREATININE 0.8 03/08/2014 1401   CREATININE 0.85 09/11/2011 1353      Component Value Date/Time    CALCIUM 10.7* 03/08/2014 1401   CALCIUM 9.6 09/11/2011 1353   ALKPHOS 57 03/08/2014 1401   ALKPHOS 38* 09/11/2011 1353   AST 28 03/08/2014 1401   AST 32 09/11/2011 1353   ALT 30 03/08/2014 1401   ALT 27 09/11/2011 1353   BILITOT 1.26* 03/08/2014 1401   BILITOT 0.6 09/11/2011 1353       CLINICAL DATA: Right sided pain  EXAM: CLINICAL DATA: Chronic low back pain with recent worsening and  difficulty standing. No previous relevant surgery.  EXAM:  MRI LUMBAR SPINE WITHOUT CONTRAST  TECHNIQUE:  Multiplanar, multisequence MR imaging was performed. No intravenous  contrast was administered.  COMPARISON: None.  FINDINGS:  Five lumbar type vertebral bodies are assumed. There is a moderate  convex left scoliosis centered at L2-3. The lateral alignment is  normal. There is no evidence of acute fracture or pars defect.  The conus medullaris extends to the L2 level and appears normal. No  paraspinal abnormalities are identified.  L1-2: Mild disc bulging and paraspinal osteophytes. No spinal  stenosis or nerve root encroachment.  L2-3: There is chronic degenerative disc disease with loss of disc  height and paraspinal osteophytes asymmetric to the right. There is  mild right lateral recess and right foraminal stenosis.  L3-4: There is chronic degenerative disc disease with loss of disc  height and paraspinal osteophytes. Facet degenerative changes are  worse on the left, related to the scoliosis. There is mild narrowing  of the lateral recesses and foramina bilaterally.  L4-5: There is chronic degenerative disc disease with loss of disc  height and paraspinal osteophytes asymmetric to the left. There is  moderate asymmetric left-sided facet hypertrophy. These factors  contribute to asymmetric left lateral recess and left foraminal  stenosis. There is no right-sided nerve root encroachment.  L5-S1: There is mild loss of disc height with asymmetric paraspinal  osteophytes and  facet hypertrophy on  the left. There is mild left  lateral recess and moderate left foraminal stenosis.  IMPRESSION:  1. There is multilevel spondylosis associated with a convex left  scoliosis.  2. Due to osteophytes and facet disease, there is foraminal and  lateral recess stenosis as detailed above. This appears worst on the  right at L2-3 and on the left at L4-5 and L5-S1.  3. No acute osseous findings or high-grade central stenosis.  Electronically Signed  By: Camie Patience M.D.  On: 01/22/2014 14:44             External Result Report   CERVICAL SPINE 4+ VIEWS  COMPARISON: None.  FINDINGS:  Reversal of the normal lordosis in the lower cervical spine.  Mild narrowing of the C4-5 interspace. There is bilateral facet  disease probably accounting for the approximately 2-3 mm  anterolisthesis C4-5.  Moderate disc narrowing C5-6 and C6-7 with anterior and posterior  endplate spurs. Uncovertebral spurs encroach upon the neural  foramina bilaterally C5-6. Negative for fracture. No prevertebral  soft tissue swelling. Marland Kitchen  IMPRESSION:  1. Negative for fracture or other acute bone abnormality.  2. Degenerative changes C4-C7 as above.  3. Mild anterolisthesis C4-5 probably secondary to facet disease,  although flexion/extension radiographs would be required to exclude  dynamic instability.  4. Loss of the normal cervical spine lordosis, which may be  secondary to positioning, spasm, or soft tissue injury.  Electronically Signed  By: Arne Cleveland M.D.  On: 01/15/2014 16:05             External Result Report    STUDIES: No results found. .  ASSESSMENT: 68 y.o.  Sandra Tapia, status post right lumpectomy and sentinel lymph node sampling August 2009 for a T1c N0, stage IA invasive ductal carcinoma, grade 1, which was strongly estrogen and progesterone receptor positive, Sandra Tapia-2-negative, with a low proliferation fraction  (1) adjuvant radiation completed October 2009,  (2) on tamoxifen between  October of 2009 and October of 2011  (3) started letrozole a January 2014  PLAN:  Sandra Tapia is doing wellon the letrozole, and the plan will be to continue for total 5 years. Sandra Tapia has neck pain  Under the care of chiropractor doing better. Sandra Tapia back pain has been evaluated with Lumbar spine MRI  No evidence of metastatic breast cancer  Continue on calcium and vit D monitor Calcium 10.7 today.  Bone density  08/08/2013 showed  Osteopenia and Sandra Tapia on fosamax,keep up with dental exam.   Mammogram scheduled 08/2014.   Total bilirubin is slightly elevated 1.26 ? Due to letrozole or other etiology AST was 46 on 08/2012,will monitor.  Follow up in 6 months with CBC and CMP.            Owens Loffler, Dionte Blaustein    03/16/2014

## 2014-03-16 NOTE — Progress Notes (Signed)
Advise no alcohol whil taking pain medications

## 2014-04-19 ENCOUNTER — Encounter: Payer: MEDICARE | Attending: Physical Medicine & Rehabilitation

## 2014-04-19 ENCOUNTER — Ambulatory Visit (HOSPITAL_BASED_OUTPATIENT_CLINIC_OR_DEPARTMENT_OTHER): Payer: MEDICARE | Admitting: Physical Medicine & Rehabilitation

## 2014-04-19 ENCOUNTER — Encounter: Payer: Self-pay | Admitting: Physical Medicine & Rehabilitation

## 2014-04-19 VITALS — BP 118/67 | HR 67 | Resp 14 | Ht 63.0 in | Wt 146.0 lb

## 2014-04-19 DIAGNOSIS — I10 Essential (primary) hypertension: Secondary | ICD-10-CM | POA: Insufficient documentation

## 2014-04-19 DIAGNOSIS — M47812 Spondylosis without myelopathy or radiculopathy, cervical region: Secondary | ICD-10-CM | POA: Insufficient documentation

## 2014-04-19 DIAGNOSIS — M47817 Spondylosis without myelopathy or radiculopathy, lumbosacral region: Secondary | ICD-10-CM

## 2014-04-19 DIAGNOSIS — M549 Dorsalgia, unspecified: Secondary | ICD-10-CM | POA: Insufficient documentation

## 2014-04-19 DIAGNOSIS — Z853 Personal history of malignant neoplasm of breast: Secondary | ICD-10-CM | POA: Insufficient documentation

## 2014-04-19 DIAGNOSIS — M4120 Other idiopathic scoliosis, site unspecified: Secondary | ICD-10-CM | POA: Insufficient documentation

## 2014-04-19 DIAGNOSIS — M542 Cervicalgia: Secondary | ICD-10-CM | POA: Insufficient documentation

## 2014-04-19 DIAGNOSIS — M5030 Other cervical disc degeneration, unspecified cervical region: Secondary | ICD-10-CM | POA: Insufficient documentation

## 2014-04-19 NOTE — Progress Notes (Signed)
  PROCEDURE RECORD [facility]   Name: Sandra Tapia DOB:09-23-1946 MRN: [id number]  Date:04/19/2014  Physician: Alysia Penna, MD    Nurse/CMA: Raihan Kimmel/Shumaker  Allergies:  Allergies  Allergen Reactions  . Penicillins Nausea Only    Consent Signed: yes  Is patient diabetic? no   Pregnant: no LMP: No LMP recorded. Patient is postmenopausal. (age 68-55)  Anticoagulants: no Anti-inflammatory: no Antibiotics: no  Procedure: Bilateral medial branch block L3-4-5  Position: Prone Start Time:  10:43 End Time:  10:54 Fluoro Time: 37 seconds  RN/CMA Tyee Vandevoorde,CMA Shumaker    Time 1027 10:59    BP 144/65 152/71    Pulse 67 67    Respirations 14 14    O2 Sat 97 98    S/S 6 6    Pain Level 8/10 6/10     D/C home with Roger-husband, [name], D/C instructions reviewed, and sits independently.

## 2014-04-19 NOTE — Patient Instructions (Signed)
Lumbar medial branch blocks were performed. This is to help diagnose the cause of the low back pain. It is important that you keep track of your pain for the first day or 2 after injection. This injection can give you temporary relief that lasts for hours or up to several months. There is no way to predict duration of pain relief.  Please try to compare your pain after injection to for the injection.  If this injection gives you  temporary relief there may be another longer-lasting procedure that may be beneficial call radiofrequency ablation  Continue physical therapy  We'll schedule for another injection next month this will most likely be for your low back. If your low back pain has improved, we can start addressing the neck issues  May call office once running low on tramadol

## 2014-04-19 NOTE — Progress Notes (Signed)

## 2014-04-20 ENCOUNTER — Telehealth: Payer: Self-pay | Admitting: *Deleted

## 2014-04-20 NOTE — Telephone Encounter (Signed)
Sandra Tapia called to report that her cheeks are really flushed and is concerned. It is not listed on the side effects sheet from the injection she had yesterday.  I called to let her know that this is a side effect of the steroid and should begin to resolve.  She may have some hot flashes as well but this is a mild side effect of being given the steroid injection.

## 2014-05-24 ENCOUNTER — Ambulatory Visit (HOSPITAL_BASED_OUTPATIENT_CLINIC_OR_DEPARTMENT_OTHER): Payer: MEDICARE | Admitting: Physical Medicine & Rehabilitation

## 2014-05-24 ENCOUNTER — Encounter: Payer: MEDICARE | Attending: Physical Medicine & Rehabilitation

## 2014-05-24 ENCOUNTER — Encounter: Payer: Self-pay | Admitting: Physical Medicine & Rehabilitation

## 2014-05-24 VITALS — BP 159/63 | HR 62 | Resp 14 | Ht 63.0 in | Wt 141.0 lb

## 2014-05-24 DIAGNOSIS — M542 Cervicalgia: Secondary | ICD-10-CM | POA: Insufficient documentation

## 2014-05-24 DIAGNOSIS — M47812 Spondylosis without myelopathy or radiculopathy, cervical region: Secondary | ICD-10-CM | POA: Insufficient documentation

## 2014-05-24 DIAGNOSIS — M47817 Spondylosis without myelopathy or radiculopathy, lumbosacral region: Secondary | ICD-10-CM

## 2014-05-24 DIAGNOSIS — Z853 Personal history of malignant neoplasm of breast: Secondary | ICD-10-CM | POA: Insufficient documentation

## 2014-05-24 DIAGNOSIS — M4120 Other idiopathic scoliosis, site unspecified: Secondary | ICD-10-CM | POA: Insufficient documentation

## 2014-05-24 DIAGNOSIS — I10 Essential (primary) hypertension: Secondary | ICD-10-CM | POA: Insufficient documentation

## 2014-05-24 DIAGNOSIS — M5030 Other cervical disc degeneration, unspecified cervical region: Secondary | ICD-10-CM | POA: Insufficient documentation

## 2014-05-24 DIAGNOSIS — M549 Dorsalgia, unspecified: Secondary | ICD-10-CM | POA: Insufficient documentation

## 2014-05-24 MED ORDER — TRAMADOL-ACETAMINOPHEN 37.5-325 MG PO TABS: 1.0000 | ORAL_TABLET | Freq: Three times a day (TID) | ORAL | Status: DC | PRN

## 2014-05-24 NOTE — Progress Notes (Signed)
Bilateral Lumbar L3, L4  medial branch blocks and L 5 dorsal ramus injection under fluoroscopic guidance  Indication: Lumbar pain which is not relieved by medication management or other conservative care and interfering with self-care and mobility.04/19/14 injection gave >50% relief of back pain  Informed consent was obtained after describing risks and benefits of the procedure with the patient, this includes bleeding, infection, paralysis and medication side effects.  The patient wishes to proceed and has given written consent.  The patient was placed in prone position.  The lumbar area was marked and prepped with Betadine.  One mL of 1% lidocaine was injected into each of 6 areas into the skin and subcutaneous tissue.  Then a 22-gauge 3.5 spinal needle was inserted targeting the junction of the left S1 superior articular process and sacral ala junction. Needle was advanced under fluoroscopic guidance.  Bone contact was made.  Omnipaque 180 was injected x 0.5 mL demonstrating no intravascular uptake.  Then a solution containing one mL of 4 mg per mL dexamethasone and 3 mL of 2% MPF lidocaine was injected x 0.5 mL.  Then the left L5 superior articular process in transverse process junction was targeted.  Bone contact was made.  Omnipaque 180 was injected x 0.5 mL demonstrating no intravascular uptake. Then a solution containing one mL of 4 mg per mL dexamethasone and 3 mL of 2% MPF lidocaine was injected x 0.5 mL.  Then the left L4 superior articular process in transverse process junction was targeted.  Bone contact was made.  Omnipaque 180 was injected x 0.5 mL demonstrating no intravascular uptake.  Then a solution containing one mL of 4 mg per mL dexamethasone and 3 mL if 2% MPF lidocaine was injected x 0.5 mL.  This same procedure was performed on the right side using the same needle, technique and injectate.  Patient tolerated procedure well.  Post procedure instructions were given.

## 2014-05-24 NOTE — Progress Notes (Signed)
  PROCEDURE RECORD [facility]   Name: Sandra Tapia DOB:[birth year] MRN: [id number]  Date:05/24/2014  Physician: Alysia Penna, MD    Nurse/CMA: Cyanna Neace CMA  Allergies:  Allergies  Allergen Reactions  . Penicillins Nausea Only    Consent Signed: yes  Is patient diabetic? no  CBG today? no  Pregnant: no LMP: No LMP recorded. Patient is postmenopausal. (age 68-55)  Anticoagulants: no Anti-inflammatory: no Antibiotics: no  Procedure: Bilateral L3-4-5 Medial Branch Block Position: Prone Start Time: 1:41 End Time:1:55  Fluoro Time: 42 seconds  RN/CMA Story Vanvranken CMA Dakin Madani CMA    Time 1:21 1:59    BP 159/63 158/67    Pulse 62 62    Respirations 14 14    O2 Sat 98 98    S/S 6 6    Pain Level 3/10 3/10     D/C home with husband, [name], D/C instructions reviewed, and sits independently.

## 2014-05-24 NOTE — Patient Instructions (Signed)

## 2014-06-01 ENCOUNTER — Encounter: Payer: Self-pay | Admitting: Physical Medicine & Rehabilitation

## 2014-06-21 ENCOUNTER — Encounter: Payer: Self-pay | Admitting: Cardiology

## 2014-07-09 ENCOUNTER — Encounter: Payer: MEDICARE | Attending: Physical Medicine & Rehabilitation

## 2014-07-09 ENCOUNTER — Ambulatory Visit (HOSPITAL_BASED_OUTPATIENT_CLINIC_OR_DEPARTMENT_OTHER): Payer: MEDICARE | Admitting: Physical Medicine & Rehabilitation

## 2014-07-09 ENCOUNTER — Encounter: Payer: Self-pay | Admitting: Physical Medicine & Rehabilitation

## 2014-07-09 VITALS — BP 163/78 | HR 71 | Resp 14 | Ht 63.0 in | Wt 145.0 lb

## 2014-07-09 DIAGNOSIS — Z853 Personal history of malignant neoplasm of breast: Secondary | ICD-10-CM | POA: Diagnosis not present

## 2014-07-09 DIAGNOSIS — M47812 Spondylosis without myelopathy or radiculopathy, cervical region: Secondary | ICD-10-CM | POA: Diagnosis not present

## 2014-07-09 DIAGNOSIS — M542 Cervicalgia: Secondary | ICD-10-CM

## 2014-07-09 DIAGNOSIS — M4120 Other idiopathic scoliosis, site unspecified: Secondary | ICD-10-CM | POA: Diagnosis not present

## 2014-07-09 DIAGNOSIS — M47817 Spondylosis without myelopathy or radiculopathy, lumbosacral region: Secondary | ICD-10-CM | POA: Insufficient documentation

## 2014-07-09 DIAGNOSIS — M5030 Other cervical disc degeneration, unspecified cervical region: Secondary | ICD-10-CM | POA: Diagnosis not present

## 2014-07-09 DIAGNOSIS — I10 Essential (primary) hypertension: Secondary | ICD-10-CM | POA: Insufficient documentation

## 2014-07-09 DIAGNOSIS — M549 Dorsalgia, unspecified: Secondary | ICD-10-CM | POA: Diagnosis not present

## 2014-07-09 NOTE — Progress Notes (Signed)
Subjective:    Patient ID: Sandra Tapia, female    DOB: 01/01/1946, 68 y.o.   MRN: [id number]  HPI Bilateral Lumbar L3, L4 medial branch blocks and L 5 dorsal ramus injection under fluoroscopic guidance  2 sets MBB >50% relief Had good and prolonged relief after radio frequency ablation many years   Neck pain moderately relieved by physical therapy. Is avoiding looking down during reading. He gets approximately 3 days relieved with cervical traction    Pain Inventory Average Pain 7 Pain Right Now 6 My pain is constant, dull, stabbing and aching  In the last 24 hours, has pain interfered with the following? General activity 6 Relation with others 6 Enjoyment of life 7 What TIME of day is your pain at its worst? daytime Sleep (in general) Fair  Pain is worse with: walking, standing and some activites Pain improves with: rest, medication and injections Relief from Meds: 5  Mobility walk without assistance how many minutes can you walk? 20 ability to climb steps?  yes do you drive?  yes Do you have any goals in this area?  yes  Function not employed: date last employed 2000 I need assistance with the following:  household duties and shopping  Neuro/Psych weakness numbness  Prior Studies Any changes since last visit?  no  Physicians involved in your care Dr Margrinet-oncology, [facility]   Family History  Problem Relation Age of Onset  . Hypertension Father   . Macular degeneration Father    History   Social History  . Marital Status: Married    Spouse Name: N/A    Number of Children: N/A  . Years of Education: N/A   Social History Main Topics  . Smoking status: Never Smoker   . Smokeless tobacco: Never Used  . Alcohol Use: 3.5 oz/week    7 drink(s) per week     Comment: 6-7 glasses of wine a week  . Drug Use: No  . Sexual Activity: Yes    Partners: Male    Birth Control/ Protection: Post-menopausal   Other Topics Concern    . None   Social History Narrative  . None   Past Surgical History  Procedure Laterality Date  . Ankle surgery    . Eye surgery    . Retinal detachment surgery  june 28th 2012  . Tonsillectomy and adenoidectomy    . Breast surgery  8/09    (Rt)Lumpectomy,sentinel node  . Mastectomy, partial Right 07/31/08    right node Bx (neg)   Past Medical History  Diagnosis Date  . Hypertension   . Elevated cholesterol   . Infertility, female   . Fibroid   . Cancer 07/2008    breast cancer(rt)  . Basal cell cancer   . Degenerative disc disease     neck  . Spinal arthritis   . Aneurysm of aorta 8/09    4.1cm ascending aortic aneurysm on breast MRI   BP 163/78  Pulse 71  Resp 14  Ht 5\' 3"  (1.6 m)  Wt 145 lb (65.772 kg)  BMI 25.69 kg/m2  SpO2 97%  Opioid Risk Score:   Fall Risk Score: Low Fall Risk (0-5 points) (patient educated handout declined)   Review of Systems  Constitutional: Positive for diaphoresis.  Musculoskeletal: Positive for back pain.  Neurological: Positive for weakness and numbness.  All other systems reviewed and are negative.      Objective:   Physical Exam  Nursing note and vitals reviewed.  Constitutional: She is oriented to person, place, and time. She appears well-developed and well-nourished.  HENT:  Head: Normocephalic and atraumatic.  Eyes: Conjunctivae and EOM are normal. Pupils are equal, round, and reactive to light.  Neck: Normal range of motion.  Musculoskeletal:   levo convex scoliosis, thoraco- lumbar  Neurological: She is alert and oriented to person, place, and time. She has normal strength and normal reflexes. No sensory deficit. Gait normal.  Psychiatric: She has a normal mood and affect.          Assessment & Plan:  #1. Lumbar spinal assist without evidence of myelopathy. 50% or greater relief on 2 occasions with bilateral lumbar L3-L4 medial branch and bilateral L5 dorsal ramus injection was performed in April and May of 2015.  Previous relief with radio frequency ablation performed at corresponding levels greater than 5 years ago. Recommend right L3-L4 medial branch and right L5 dorsal ramus radio frequency ablation next visit. This will be followed by left side. 2. 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 May benefit from cervical medial branch blocks to further assess after the lumbar spine is treated

## 2014-07-09 NOTE — Patient Instructions (Signed)
Next visit for RF, see pamphlet  Rx for Cervical traction device give to PT

## 2014-07-16 ENCOUNTER — Other Ambulatory Visit: Payer: Self-pay | Admitting: Family Medicine

## 2014-07-16 ENCOUNTER — Other Ambulatory Visit (HOSPITAL_COMMUNITY)
Admission: RE | Admit: 2014-07-16 | Discharge: 2014-07-16 | Disposition: A | Discharge status: Home / Self Care | Payer: MEDICARE | Source: Ambulatory Visit | Attending: Family Medicine | Admitting: Family Medicine

## 2014-07-16 DIAGNOSIS — Z124 Encounter for screening for malignant neoplasm of cervix: Secondary | ICD-10-CM | POA: Insufficient documentation

## 2014-07-18 ENCOUNTER — Ambulatory Visit: Payer: MEDICARE | Admitting: Cardiology

## 2014-07-18 LAB — CYTOLOGY - PAP

## 2014-07-27 ENCOUNTER — Encounter: Payer: Self-pay | Admitting: Physical Medicine & Rehabilitation

## 2014-07-27 ENCOUNTER — Ambulatory Visit (HOSPITAL_BASED_OUTPATIENT_CLINIC_OR_DEPARTMENT_OTHER): Payer: MEDICARE | Admitting: Physical Medicine & Rehabilitation

## 2014-07-27 VITALS — BP 144/70 | HR 65 | Resp 14 | Ht 63.0 in | Wt 145.0 lb

## 2014-07-27 DIAGNOSIS — M4120 Other idiopathic scoliosis, site unspecified: Secondary | ICD-10-CM | POA: Diagnosis not present

## 2014-07-27 DIAGNOSIS — M47817 Spondylosis without myelopathy or radiculopathy, lumbosacral region: Secondary | ICD-10-CM

## 2014-07-27 MED ORDER — GABAPENTIN 600 MG PO TABS: 600.0000 mg | ORAL_TABLET | Freq: Three times a day (TID) | ORAL | Status: DC

## 2014-07-27 NOTE — Progress Notes (Signed)
  PROCEDURE RECORD [facility]   Name: Sandra Tapia DOB:10-19-1946 MRN: [id number]  Date:07/27/2014  Physician: Alysia Penna, MD    Nurse/CMA: Levens,CMA/Walston, CMA  Allergies:  Allergies  Allergen Reactions  . Penicillins Nausea Only    Consent Signed: Yes.    Is patient diabetic? No.    Pregnant: No. LMP: No LMP recorded. Patient is postmenopausal. (age 68-55)  Anticoagulants: no Anti-inflammatory: no Antibiotics: yes (Doxycycline for skin)  Procedure: Right L3-4-5 Radiofrequency Neurotomy  Position: Prone Start Time: 1:14  End Time: 1:29  Fluoro Time: 29 sec  RN/CMA Levens,CMA Walston,CMA    Time 1255 1:37    BP 144/70 172/63    Pulse 65 60    Respirations 14 14    O2 Sat 96 99    S/S 6 6    Pain Level 7/10      D/C home with Husband, [name], D/C instructions reviewed, and sits independently.

## 2014-07-27 NOTE — Patient Instructions (Signed)

## 2014-07-27 NOTE — Progress Notes (Signed)
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  Informed consent was obtained after describing risks and benefits of the procedure with the patient, this includes bleeding, bruising, infection, paralysis and medication side effects. The patient wishes to proceed and has given written consent. The patient was placed in a prone position. The lumbar and sacral area was marked and prepped with Betadine. A 25-gauge 1-1/2 inch needle was inserted into the skin and subcutaneous tissue at 3 sites in one ML of 1% lidocaine was injected into each site. Then a 20-gauge 10 cm radio frequency needle with a 1 cm curved active tip was inserted targeting the Right S1 SAP/sacral ala junction. Bone contact was made and confirmed with lateral imaging. Sensory stimulation at 50 Hz followed by motor stimulation at 2 Hz confirm proper needle location followed by injection of one ML of the solution containing one ML of 4 mg per mL dexamethasone and 3 mL of 1% MPF lidocaine. Then the Right L5 SAP/transverse process junction was targeted. Bone contact was made and confirmed with lateral imaging. Sensory stimulation at 50 Hz followed by motor stimulation at 2 Hz confirm proper needle location followed by injection of one ML of the solution containing one ML of 4 mg per mL dexamethasone and 3 mL of 1% MPF lidocaine. Then the Right L4 SAP/transverse process junction was targeted. Bone contact was made and confirmed with lateral imaging. Sensory stimulation at 50 Hz followed by motor stimulation at 2 Hz confirm proper needle location followed by injection of one ML of the solution containing one ML of 4 mg per mL dexamethasone and 3 mL of 1% MPF lidocaine. Radio frequency lesion being at 80C for 90 seconds  was performed. Needles were removed. Post procedure instructions and vital signs were performed. Patient tolerated procedure well. Followup appointment was given. 

## 2014-08-17 ENCOUNTER — Encounter: Payer: Self-pay | Admitting: Cardiology

## 2014-08-17 ENCOUNTER — Ambulatory Visit (INDEPENDENT_AMBULATORY_CARE_PROVIDER_SITE_OTHER): Payer: Medicare Other | Admitting: Cardiology

## 2014-08-17 VITALS — BP 145/86 | HR 64 | Ht 63.0 in | Wt 145.4 lb

## 2014-08-17 DIAGNOSIS — I77810 Thoracic aortic ectasia: Secondary | ICD-10-CM

## 2014-08-17 DIAGNOSIS — I10 Essential (primary) hypertension: Secondary | ICD-10-CM

## 2014-08-17 NOTE — Patient Instructions (Signed)
The current medical regimen is effective;  continue present plan and medications.  Follow up in 1 year with Dr Skains.  You will receive a letter in the mail 2 months before you are due.  Please call us when you receive this letter to schedule your follow up appointment.  

## 2014-08-17 NOTE — Progress Notes (Signed)
[name] [address] [place], [place]  [address] Phone: [phone] Fax:  [phone]  Date:  08/17/2014   ID:  Sandra Tapia, DOB 06-May-1946, MRN [id number]  PCP:  Cari Caraway, MD   History of Present Illness: Sandra Tapia is a 68 y.o. female with aortic root of 4.1 -> 4.3 -> 4.35-->4.2 cm ascending aorta. She also has mild to moderate valvular disease. Prior MRI of chest demonstrated no evidence of coarctation. Normal ejection fraction.   Prior LDL cholesterol, she is on simvastatin, was under good control just above 100. She is also a recent breast cancer survivor with XRT/lumpectomy. No chemotherapy other than tamoxifen.  Prior exercise treadmill test reassuring. This was performed because of increasing dyspnea on exertion. She is no longer complaining of this symptom.  Last week she did have an isolated symptom left posterior rib pain when she woke up that was concerning to her. Changed with position. Or shortness of breath, no anterior chest discomfort. As the day went on, this pain resolved. She has had no weight loss, no bleeding, no syncope.    Wt Readings from Last 3 Encounters:  08/17/14 145 lb 6.4 oz (65.953 kg)  07/27/14 145 lb (65.772 kg)  07/09/14 145 lb (65.772 kg)     Past Medical History  Diagnosis Date  . Hypertension   . Elevated cholesterol   . Infertility, female   . Fibroid   . Cancer 07/2008    breast cancer(rt)  . Basal cell cancer   . Degenerative disc disease     neck  . Spinal arthritis   . Aneurysm of aorta 8/09    4.1cm ascending aortic aneurysm on breast MRI    Past Surgical History  Procedure Laterality Date  . Ankle surgery    . Eye surgery    . Retinal detachment surgery  june 28th 2012  . Tonsillectomy and adenoidectomy    . Breast surgery  8/09    (Rt)Lumpectomy,sentinel node  . Mastectomy, partial Right 07/31/08    right node Bx (neg)    Current Outpatient Prescriptions  Medication Sig Dispense  Refill  . alendronate (FOSAMAX) 10 MG tablet Take 10 mg by mouth once a week. Take with a full glass of water on an empty stomach.      . ALPHAGAN P 0.1 % SOLN Place 1 drop into the left eye 2 (two) times daily.       Marland Kitchen aspirin 81 MG tablet Take 81 mg by mouth daily.      . Biotin 10 MG CAPS Take 1 tablet by mouth daily. 1 tablet      . Calcium Carbonate Antacid (TUMS ULTRA 1000 PO) Take 4,000 mg by mouth daily. 2 tablets in the am and 2 tablets in the pm.      . diclofenac (VOLTAREN) 75 MG EC tablet Take 75 mg by mouth 2 (two) times daily.      Marland Kitchen doxycycline (PERIOSTAT) 20 MG tablet Take 20 mg by mouth 2 (two) times daily.       . fexofenadine (ALLEGRA) 180 MG tablet Take 180 mg by mouth daily.      . fish oil-omega-3 fatty acids 1000 MG capsule Take 3 g by mouth daily.       Marland Kitchen gabapentin (NEURONTIN) 600 MG tablet Take 600 mg by mouth as needed (takes after procedure.).      Marland Kitchen ketorolac (ACULAR) 0.4 % SOLN Place 1 drop into the left eye  4 (four) times daily.       Marland Kitchen letrozole (FEMARA) 2.5 MG tablet Take 1 tablet (2.5 mg total) by mouth daily.  90 tablet  3  . lisinopril-hydrochlorothiazide (PRINZIDE,ZESTORETIC) 10-12.5 MG per tablet Take 1 tablet by mouth daily.      . Misc Natural Products (TART CHERRY ADVANCED PO) Take 3 tablets by mouth daily.       . Multiple Vitamin (MULTIVITAMIN) tablet Take 1 tablet by mouth daily.      . simvastatin (ZOCOR) 20 MG tablet Take 20 mg by mouth daily.       . tazarotene (AVAGE) 0.1 % cream Apply 1 application topically at bedtime.      . traMADol-acetaminophen (ULTRACET) 37.5-325 MG per tablet Take 1 tablet by mouth every 8 (eight) hours as needed.  90 tablet  0  . tretinoin (RETIN-A) 0.025 % cream Apply 1 application topically every other day.       . Vitamin D, Ergocalciferol, (DRISDOL) 50000 UNITS CAPS Take 1 capsule (50,000 Units total) by mouth every 14 (fourteen) days.  26 capsule  0   No current facility-administered medications for this visit.     Allergies:    Allergies  Allergen Reactions  . Penicillins     Nausea & stomach pains    Social History:  The patient  reports that she has never smoked. She has never used smokeless tobacco. She reports that she drinks about 3.5 ounces of alcohol per week. She reports that she does not use illicit drugs.   Family History  Problem Relation Age of Onset  . Hypertension Father   . Macular degeneration Father     ROS:  Please see the history of present illness.      PHYSICAL EXAM: VS:  BP 145/86  Pulse 64  Ht 5\' 3"  (1.6 m)  Wt 145 lb 6.4 oz (65.953 kg)  BMI 25.76 kg/m2 Well nourished, well developed, in no acute distress HEENT: normal, [place]/AT, EOMI Neck: no JVD, normal carotid upstroke, no bruit Cardiac:  normal S1, S2; RRR; no murmur Lungs:  clear to auscultation bilaterally, no wheezing, rhonchi or rales Abd: soft, nontender, no hepatomegaly, no bruits Ext: no edema, 2+ distal pulses Skin: warm and dry GU: deferred Neuro: no focal abnormalities noted, AAO x 3  EKG:  08/17/14-sinus rhythm, no other abnormalities    ASSESSMENT AND PLAN:  1. Dilated aortic root-currently 42 mm. Stable. It has been quite stable for some time. We will continue to monitor. I will forego echocardiogram next year, 2016, given her stability. We will likely check and 2017. 2. Posterior rib/back pain. Currently resolved. She does not have any point tenderness. She did not describe any tearing-like sensation. Likely muscular/musculoskeletal. If returns or become more worrisome, she knows to seek medical attention. 3. I would like to see her back in one year.  Signed, Candee Furbish, MD [facility]  08/17/2014 3:51 PM

## 2014-08-21 ENCOUNTER — Encounter: Payer: Medicare Other | Attending: Physical Medicine & Rehabilitation

## 2014-08-21 ENCOUNTER — Ambulatory Visit (HOSPITAL_BASED_OUTPATIENT_CLINIC_OR_DEPARTMENT_OTHER): Payer: Medicare Other | Admitting: Physical Medicine & Rehabilitation

## 2014-08-21 ENCOUNTER — Encounter: Payer: Self-pay | Admitting: Physical Medicine & Rehabilitation

## 2014-08-21 DIAGNOSIS — M47812 Spondylosis without myelopathy or radiculopathy, cervical region: Secondary | ICD-10-CM | POA: Diagnosis not present

## 2014-08-21 DIAGNOSIS — M4120 Other idiopathic scoliosis, site unspecified: Secondary | ICD-10-CM | POA: Insufficient documentation

## 2014-08-21 DIAGNOSIS — M47817 Spondylosis without myelopathy or radiculopathy, lumbosacral region: Secondary | ICD-10-CM | POA: Insufficient documentation

## 2014-08-21 DIAGNOSIS — M549 Dorsalgia, unspecified: Secondary | ICD-10-CM | POA: Insufficient documentation

## 2014-08-21 DIAGNOSIS — M542 Cervicalgia: Secondary | ICD-10-CM | POA: Diagnosis not present

## 2014-08-21 DIAGNOSIS — Z853 Personal history of malignant neoplasm of breast: Secondary | ICD-10-CM | POA: Insufficient documentation

## 2014-08-21 DIAGNOSIS — M5030 Other cervical disc degeneration, unspecified cervical region: Secondary | ICD-10-CM | POA: Diagnosis not present

## 2014-08-21 DIAGNOSIS — I10 Essential (primary) hypertension: Secondary | ICD-10-CM | POA: Diagnosis not present

## 2014-08-21 MED ORDER — GABAPENTIN 600 MG PO TABS: 600.0000 mg | ORAL_TABLET | ORAL | Status: DC | PRN

## 2014-08-21 NOTE — Progress Notes (Signed)
Left L5 dorsal ramus., left L4 and left L3 medial branch radio frequency neuropathy under fluoroscopic guidance  Indication: Low back pain due to lumbar spondylosis which has been relieved on 2 occasions by greater than 50% by lumbar medial branch blocks at corresponding levels.  Informed consent was obtained after describing risks and benefits of the procedure with the patient, this includes bleeding, bruising, infection, paralysis and medication side effects. The patient wishes to proceed and has given written consent. The patient was placed in a prone position. The lumbar and sacral area was marked and prepped with Betadine. A 25-gauge 1-1/2 inch needle was inserted into the skin and subcutaneous tissue at 3 sites in one ML of 1% lidocaine was injected into each site. Then a 20-gauge 10cm cm radio frequency needle with a 1 cm curved active tip was inserted targeting the left S1 SAP/sacral ala junction. Bone contact was made and confirmed with lateral imaging. Sensory stimulation at 50 Hz followed by motor stimulation at 2 Hz confirm proper needle location followed by injection of one ML of the solution containing one ML of 4 mg per mL dexamethasone and 3 mL of 1% MPF lidocaine. Then the left L5 SAP/transverse process junction was targeted. Bone contact was made and confirmed with lateral imaging. Sensory stimulation at 50 Hz followed by motor stimulation at 2 Hz confirm proper needle location followed by injection of one ML of the solution containing one ML of 4 mg per mL dexamethasone and 3 mL of 1% MPF lidocaine. Then the left L4 SAP/transverse process junction was targeted. Bone contact was made and confirmed with lateral imaging. Sensory stimulation at 50 Hz followed by motor stimulation at 2 Hz confirm proper needle location followed by injection of one ML of the solution containing one ML of 4 mg per mL dexamethasone and 3 mL of 1% MPF lidocaine. Radio frequency lesion being at 80C for 90 seconds was  performed. Needles were removed. Post procedure instructions and vital signs were performed. Patient tolerated procedure well. Followup appointment was given.  

## 2014-08-21 NOTE — Progress Notes (Signed)
  PROCEDURE RECORD [facility]   Name: Sandra Tapia DOB:16-Dec-1946 MRN: [id number]  Date:08/21/2014  Physician: Alysia Penna, MD    Nurse/CMA: Miyako Oelke RN  Allergies:  Allergies  Allergen Reactions  . Penicillins     Nausea & stomach pains    Consent Signed: Yes.    Is patient diabetic? No.  CBG today?   Pregnant: No. LMP: No LMP recorded. Patient is postmenopausal. (age 68-55)  Anticoagulants: no Anti-inflammatory: no Antibiotics: no  Procedure: Left Radiofrequency Neurotomy Position: Prone Start Time: 2:10 End Time: 2:34 Fluoro Time: 53 sec  RN/CMA Biomedical engineer    Time 1:56 2:40    BP 159/71 159/67    Pulse 58 55    Respirations 14 14    O2 Sat 97 99    S/S 6 6    Pain Level 6/10 5/10     D/C home with husband, [name], D/C instructions reviewed, and sits independently.

## 2014-08-21 NOTE — Patient Instructions (Signed)

## 2014-08-29 ENCOUNTER — Encounter: Payer: Self-pay | Admitting: Physical Medicine & Rehabilitation

## 2014-09-06 ENCOUNTER — Ambulatory Visit: Payer: MEDICARE | Admitting: Physical Medicine & Rehabilitation

## 2014-09-13 ENCOUNTER — Ambulatory Visit: Payer: MEDICARE | Admitting: Physical Medicine & Rehabilitation

## 2014-09-17 ENCOUNTER — Other Ambulatory Visit (HOSPITAL_BASED_OUTPATIENT_CLINIC_OR_DEPARTMENT_OTHER): Payer: Medicare Other

## 2014-09-17 ENCOUNTER — Telehealth: Payer: Self-pay | Admitting: Oncology

## 2014-09-17 ENCOUNTER — Ambulatory Visit (HOSPITAL_BASED_OUTPATIENT_CLINIC_OR_DEPARTMENT_OTHER): Payer: Medicare Other | Admitting: Oncology

## 2014-09-17 VITALS — BP 147/77 | HR 63 | Temp 98.6°F | Resp 18 | Ht 63.0 in | Wt 145.8 lb

## 2014-09-17 DIAGNOSIS — C50919 Malignant neoplasm of unspecified site of unspecified female breast: Secondary | ICD-10-CM

## 2014-09-17 DIAGNOSIS — N951 Menopausal and female climacteric states: Secondary | ICD-10-CM

## 2014-09-17 DIAGNOSIS — Z170 Estrogen receptor positive status [ER+]: Secondary | ICD-10-CM

## 2014-09-17 LAB — CBC WITH DIFFERENTIAL/PLATELET
BASO%: 0.7 % (ref 0.0–2.0)
BASOS ABS: 0.1 10*3/uL (ref 0.0–0.1)
EOS%: 2.9 % (ref 0.0–7.0)
Eosinophils Absolute: 0.2 10*3/uL (ref 0.0–0.5)
HEMATOCRIT: 39.8 % (ref 34.8–46.6)
HGB: 13 g/dL (ref 11.6–15.9)
LYMPH#: 1.7 10*3/uL (ref 0.9–3.3)
LYMPH%: 20.4 % (ref 14.0–49.7)
MCH: 31.8 pg (ref 25.1–34.0)
MCHC: 32.7 g/dL (ref 31.5–36.0)
MCV: 97.1 fL (ref 79.5–101.0)
MONO#: 0.7 10*3/uL (ref 0.1–0.9)
MONO%: 7.8 % (ref 0.0–14.0)
NEUT#: 5.8 10*3/uL (ref 1.5–6.5)
NEUT%: 68.2 % (ref 38.4–76.8)
Platelets: 218 10*3/uL (ref 145–400)
RBC: 4.1 10*6/uL (ref 3.70–5.45)
RDW: 13.1 % (ref 11.2–14.5)
WBC: 8.5 10*3/uL (ref 3.9–10.3)

## 2014-09-17 LAB — COMPREHENSIVE METABOLIC PANEL (CC13)
ALT: 25 U/L (ref 0–55)
AST: 25 U/L (ref 5–34)
Albumin: 3.8 g/dL (ref 3.5–5.0)
Alkaline Phosphatase: 58 U/L (ref 40–150)
Anion Gap: 10 mEq/L (ref 3–11)
BUN: 15.8 mg/dL (ref 7.0–26.0)
CALCIUM: 9.8 mg/dL (ref 8.4–10.4)
CHLORIDE: 104 meq/L (ref 98–109)
CO2: 27 mEq/L (ref 22–29)
CREATININE: 0.8 mg/dL (ref 0.6–1.1)
Glucose: 107 mg/dl (ref 70–140)
Potassium: 4.7 mEq/L (ref 3.5–5.1)
Sodium: 141 mEq/L (ref 136–145)
Total Bilirubin: 0.64 mg/dL (ref 0.20–1.20)
Total Protein: 7.3 g/dL (ref 6.4–8.3)

## 2014-09-17 MED ORDER — LETROZOLE 2.5 MG PO TABS: 2.5000 mg | ORAL_TABLET | Freq: Every day | ORAL | Status: DC

## 2014-09-17 NOTE — Telephone Encounter (Signed)
per pof to sch pt appt-sch & gave pt copy of sch °

## 2014-09-17 NOTE — Progress Notes (Signed)
ID: Sandra Tapia   DOB: [birth year]  MR#: [id number]  YHC#:[id number]  PCP: Cari Caraway, MD GYN: C. Romine SU: Leane Para OTHER MD: [place] [facility], [facility]  CHIEF COMPLAINT: Estrogen receptor positive breast cancer  CURRENT TREATMENT: Letrozole   BREAST CANCER HISTORY. From the original intake note:  She felt a soreness in her right breast, a little bit before the July 4 weekend, but on June 30 2008 particularly she palpated a mass, which was new.  She brought it to Dr. Julieta Bellini attention and she was set up for evaluation at the [facility].  Noted that she had screening mammography April 16, 2008, which found very dense breasts, but otherwise no worrisome findings.  Now, on July 6 the patient was found by Dr. Truddie Coco to have a palpable abnormality in the upper, outer quadrant of the right breast.  By mammogram this was asymmetric with ill-defined margins and measured up to 2.1 cm.  Ultrasound showed this to measure 1.4 cm.  There was also a hypoechoic mass in the axilla measuring up to 2.6 cm, but morphologically it appeared as a normal lymph node.  Breast specific gamma imaging was performed July 8 and showed no abnormality on the left.  On the right, there was a single area of moderately intense activity measuring 1.3 cm maximally.  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  With this information the patient was referred to Dr. Dalbert Batman and bilateral breast MRIs were obtained July 22.  This showed only the solitary mass in the lower outer quadrant of the right breast measuring 1.7 cm by MRI.  Accordingly on August 4, the patient proceeded to right lumpectomy and sentinel lymph node sampling.  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  Her subsequent history is as detailed below  INTERVAL HISTORY: Sandra Tapia returns today for followup of her breast cancer accompanied by her husband Sandra Tapia. The interval history is significant for her continuing to have back problems, handled by Dr. Dianna Limbo. She's had some "shots" and a couple of neurotomy switch she has found very helpful. She feels she could start a walking program and she could also be doing some stretching exercises although she in fact has not started to do those  She continues on letrozole. She does have hot flashes which are perhaps a little more frequent but briefer. She has been in gabapentin in venlafaxine for these but did not like the way of made her feel and does not want to go back on those medications. She does have some vaginal dryness issues.  REVIEW OF SYSTEMS: She is doing well except as noted above and a detailed review of systems today was otherwise negative.  PAST MEDICAL HISTORY: Past Medical History  Diagnosis Date  . Hypertension   . Elevated cholesterol   . Infertility, female   . Fibroid   . Cancer 07/2008    breast cancer(rt)  . Basal cell cancer   . Degenerative disc disease     neck  . Spinal arthritis   . Aneurysm of aorta 8/09    4.1cm ascending aortic aneurysm on breast MRI  She has history of  seasonal allergies, a history of osteopenia, a history of moderate osteoarthritis for which she takes nonsteroidals twice daily, history of hyperlipidemia, history of hypertension, history of a newly found ascending aortic aneurysm measuring 4.1 cm and evaluated by Dr. Lysle Rubens, history of early cataracts.  PAST SURGICAL HISTORY: Past Surgical History  Procedure Laterality Date  . Ankle surgery    . Eye surgery    . Retinal detachment surgery  june 28th 2012  . Tonsillectomy and adenoidectomy    . Breast surgery  8/09     (Rt)Lumpectomy,sentinel node  . Mastectomy, partial Right 07/31/08    right node Bx (neg)    FAMILY HISTORY (updated SEPT 2013) Family History  Problem Relation Age of Onset  . Hypertension Father   . Macular degeneration Father    The patient's parents are alive, though her father at 7 is declining.  The patient has one sister in good health.  There is no history of breast or ovarian cancer in the family except for the patient's father's sister (one of two) who was diagnosed in her 19s.  GYNECOLOGIC HISTORY: The patient is Gx, P1, first pregnancy to term age 19, last menstrual period 2003.  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  Her husband, Sandra Tapia, is retired from Chirop[employer].  They have a daughter [name] who works for [employer] and two grandchildren.  She lives in [place].  The patient attends [employer] in [place].   ADVANCED DIRECTIVES:  HEALTH MAINTENANCE: History  Substance Use Topics  . Smoking status: Never Smoker   . Smokeless tobacco: Never Used  . Alcohol Use: 3.5 oz/week    7 drink(s) per week     Comment: 6-7 glasses of wine a week     Colonoscopy:  PAP:  Bone density:  Lipid panel:  Allergies  Allergen Reactions  . Penicillins     Nausea & stomach pains    Current Outpatient Prescriptions  Medication Sig Dispense Refill  . alendronate (FOSAMAX) 10 MG tablet Take 10 mg by mouth once a week. Take with a full glass of water on an empty stomach.      . ALPHAGAN P 0.1 % SOLN Place 1 drop into the left eye 2 (two) times daily.       Marland Kitchen aspirin 81 MG tablet Take 81 mg by mouth daily.      . Biotin 10 MG CAPS Take 1 tablet by mouth daily. 1 tablet      . Calcium Carbonate Antacid (TUMS ULTRA 1000 PO) Take 4,000 mg by mouth daily. 2 tablets in the am and 2 tablets in the pm.      . diclofenac (VOLTAREN) 75 MG EC tablet Take 75 mg by mouth 2 (two) times daily.       Marland Kitchen doxycycline (PERIOSTAT) 20 MG tablet Take 20 mg by mouth 2 (two) times daily.       . fexofenadine (ALLEGRA) 180 MG tablet Take 180 mg by mouth daily.      . fish oil-omega-3 fatty acids 1000 MG capsule Take 3 g by mouth daily.       Marland Kitchen gabapentin (NEURONTIN) 600 MG tablet Take 1 tablet (600 mg total) by mouth as needed (takes after procedure.).  3 tablet  1  . ketorolac (ACULAR) 0.4 % SOLN Place 1 drop into the left eye 4 (four) times daily.       Marland Kitchen letrozole (  FEMARA) 2.5 MG tablet Take 1 tablet (2.5 mg total) by mouth daily.  90 tablet  3  . lisinopril-hydrochlorothiazide (PRINZIDE,ZESTORETIC) 10-12.5 MG per tablet Take 1 tablet by mouth daily.      . Misc Natural Products (TART CHERRY ADVANCED PO) Take 3 tablets by mouth daily.       . Multiple Vitamin (MULTIVITAMIN) tablet Take 1 tablet by mouth daily.      . simvastatin (ZOCOR) 20 MG tablet Take 20 mg by mouth daily.       . tazarotene (AVAGE) 0.1 % cream Apply 1 application topically at bedtime.      . traMADol-acetaminophen (ULTRACET) 37.5-325 MG per tablet Take 1 tablet by mouth every 8 (eight) hours as needed.  90 tablet  0  . tretinoin (RETIN-A) 0.025 % cream Apply 1 application topically every other day.       . Vitamin D, Ergocalciferol, (DRISDOL) 50000 UNITS CAPS Take 1 capsule (50,000 Units total) by mouth every 14 (fourteen) days.  26 capsule  0   No current facility-administered medications for this visit.    OBJECTIVE: Middle-aged white woman in no acute distress Filed Vitals:   09/17/14 1457  BP: 147/77  Pulse: 63  Temp: 98.6 F (37 C)  Resp: 18     Body mass index is 25.83 kg/(m^2).    ECOG FS: 1  Sclerae unicteric, the pupils round and equal Oropharynx clear, teeth in good repair No cervical or supraclavicular adenopathy Lungs no rales or rhonchi Heart regular rate and rhythm, no murmur appreciated Abd soft, nontender, positive bowel sounds MSK no focal spinal tenderness, no upper extremity  lymphedema Neuro: nonfocal, well oriented, appropriate affect Breasts: The right breast is status post lumpectomy and radiation. There is no evidence of local recurrence. The right axilla is benign. The left breast is unremarkable.  LAB RESULTS: Lab Results  Component Value Date   WBC 8.5 09/17/2014   NEUTROABS 5.8 09/17/2014   HGB 13.0 09/17/2014   HCT 39.8 09/17/2014   MCV 97.1 09/17/2014   PLT 218 09/17/2014      Chemistry      Component Value Date/Time   NA 140 03/08/2014 1401   NA 138 09/11/2011 1353   K 4.0 03/08/2014 1401   K 4.0 09/11/2011 1353   CL 102 09/07/2012 1106   CL 101 09/11/2011 1353   CO2 27 03/08/2014 1401   CO2 28 09/11/2011 1353   BUN 12.2 03/08/2014 1401   BUN 17 09/11/2011 1353   CREATININE 0.8 03/08/2014 1401   CREATININE 0.85 09/11/2011 1353      Component Value Date/Time   CALCIUM 10.7* 03/08/2014 1401   CALCIUM 9.6 09/11/2011 1353   ALKPHOS 57 03/08/2014 1401   ALKPHOS 38* 09/11/2011 1353   AST 28 03/08/2014 1401   AST 32 09/11/2011 1353   ALT 30 03/08/2014 1401   ALT 27 09/11/2011 1353   BILITOT 1.26* 03/08/2014 1401   BILITOT 0.6 09/11/2011 1353       Lab Results  Component Value Date   LABCA2 11 08/28/2010    No components found with this basename: [id number]    No results found for this basename: INR,  in the last 168 hours  Urinalysis    Component Value Date/Time   COLORURINE YELLOW 07/26/2008 [place] 07/26/2008 1145   LABSPEC 1.008 07/26/2008 1145   PHURINE 7.5 07/26/2008 [place] 07/26/2008 1145   [place] 07/26/2008 1145   [place] 07/26/2008 1145  KETONESUR NEGATIVE 07/26/2008 1145   PROTEINUR NEGATIVE 07/26/2008 1145   UROBILINOGEN 0.2 07/26/2008 1145   NITRITE NEGATIVE 07/26/2008 1145   LEUKOCYTESUR NEGATIVE MICROSCOPIC NOT DONE ON URINES WITH NEGATIVE PROTEIN, BLOOD, LEUKOCYTES, NITRITE, OR GLUCOSE <1000 mg/dL. 07/26/2008 1145    STUDIES: Bilateral diagnostic mammography at [facility] it 13 2015 was  unremarkable Bone density at [facility] 08/08/2013 showed a T score of -1.5  .  ASSESSMENT: 68 y.o. [place] woman, status post right lumpectomy and sentinel lymph node sampling August 2009 for a T1c N0, stage IA invasive ductal carcinoma, grade 1, which was strongly estrogen and progesterone receptor positive, HER-2-negative, with a low proliferation fraction  (1) adjuvant radiation completed October 2009,  (2) on tamoxifen between October of 2009 and October of 2013  (3) started letrozole a January 2014  PLAN:  Shasta is tolerating the letrozole well. The main concern of course is a bone density issues and she is on Fosamax, which generally takes care of that problem in this setting.  She received tamoxifen for 4 years. I think if she receives 2 years of letrozole she will have obtained a maximum benefit. Accordingly I will see her one more time, a year from now, and she'll either graduate from followup here or she will join our survivorship clinic.  Today I gave her information on our "intimacy and pelvic health" program. She may be interested in participating although she does not list vaginal dryness as a major issue.  Madasyn is a good understanding of the overall plan. She agrees with that. She knows the goal of treatment in her cases cure. She will call with any problems that may develop before her next visit here.  Kolbee Bogusz C    09/17/2014

## 2014-09-24 ENCOUNTER — Encounter: Payer: Self-pay | Admitting: Physical Medicine & Rehabilitation

## 2014-09-24 ENCOUNTER — Ambulatory Visit (HOSPITAL_BASED_OUTPATIENT_CLINIC_OR_DEPARTMENT_OTHER): Payer: Medicare Other | Admitting: Physical Medicine & Rehabilitation

## 2014-09-24 ENCOUNTER — Encounter: Payer: Medicare Other | Attending: Physical Medicine & Rehabilitation

## 2014-09-24 VITALS — BP 149/77 | HR 69 | Resp 14 | Ht 63.0 in | Wt 145.6 lb

## 2014-09-24 DIAGNOSIS — M4120 Other idiopathic scoliosis, site unspecified: Secondary | ICD-10-CM | POA: Diagnosis not present

## 2014-09-24 DIAGNOSIS — M47817 Spondylosis without myelopathy or radiculopathy, lumbosacral region: Secondary | ICD-10-CM | POA: Diagnosis not present

## 2014-09-24 DIAGNOSIS — IMO0001 Reserved for inherently not codable concepts without codable children: Secondary | ICD-10-CM

## 2014-09-24 DIAGNOSIS — M7918 Myalgia, other site: Secondary | ICD-10-CM

## 2014-09-24 DIAGNOSIS — Z853 Personal history of malignant neoplasm of breast: Secondary | ICD-10-CM | POA: Insufficient documentation

## 2014-09-24 DIAGNOSIS — M542 Cervicalgia: Secondary | ICD-10-CM | POA: Diagnosis not present

## 2014-09-24 DIAGNOSIS — M5030 Other cervical disc degeneration, unspecified cervical region: Secondary | ICD-10-CM | POA: Diagnosis not present

## 2014-09-24 DIAGNOSIS — M47812 Spondylosis without myelopathy or radiculopathy, cervical region: Secondary | ICD-10-CM | POA: Insufficient documentation

## 2014-09-24 DIAGNOSIS — I10 Essential (primary) hypertension: Secondary | ICD-10-CM | POA: Insufficient documentation

## 2014-09-24 DIAGNOSIS — M549 Dorsalgia, unspecified: Secondary | ICD-10-CM | POA: Insufficient documentation

## 2014-09-24 NOTE — Progress Notes (Signed)
Subjective:    Patient ID: Sandra Tapia, female    DOB: 23-Jul-1946, 68 y.o.   MRN: [id number]  HPI 08/21/2014 Left L5 dorsal ramus., left L4 and left L3 medial branch radio frequency neurotomy under fluoroscopic guidance  07/27/2014 Right L5 dorsal ramus., Right L4 and Right L3 medial branch radio frequency neurotomy under fluoroscopic guidance   Pain at rest mainly a 2 With standing may go to a 3 With prolonged shopping up to a 5  Off tramadol Pain Inventory Average Pain 3 Pain Right Now 3 My pain is constant, sharp and aching  In the last 24 hours, has pain interfered with the following? General activity 3 Relation with others 3 Enjoyment of life 5 What TIME of day is your pain at its worst? daytime Sleep (in general) Good  Pain is worse with: walking, standing and some activites Pain improves with: rest, pacing activities, medication and injections Relief from Meds: only takes meds if needed  Mobility walk without assistance  Function not employed: date last employed 2000  Neuro/Psych numbness trouble walking  Prior Studies Any changes since last visit?  no CERVICAL SPINE 4+ VIEWS  COMPARISON: None.  FINDINGS:  Reversal of the normal lordosis in the lower cervical spine.  Mild narrowing of the C4-5 interspace. There is bilateral facet  disease probably accounting for the approximately 2-3 mm  anterolisthesis C4-5.  Moderate disc narrowing C5-6 and C6-7 with anterior and posterior  endplate spurs. Uncovertebral spurs encroach upon the neural  foramina bilaterally C5-6. Negative for fracture. No prevertebral  soft tissue swelling. Marland Kitchen  IMPRESSION:  1. Negative for fracture or other acute bone abnormality.  2. Degenerative changes C4-C7 as above.  3. Mild anterolisthesis C4-5 probably secondary to facet disease,  although flexion/extension radiographs would be required to exclude  dynamic instability.  4. Loss of the normal cervical spine lordosis,  which may be  secondary to positioning, spasm, or soft tissue injury.  Electronically Signed  By: Arne Cleveland M.D.  On: 01/15/2014 16:05  Physicians involved in your care Any changes since last visit?  no   Family History  Problem Relation Age of Onset  . Hypertension Father   . Macular degeneration Father    History   Social History  . Marital Status: Married    Spouse Name: N/A    Number of Children: N/A  . Years of Education: N/A   Social History Main Topics  . Smoking status: Never Smoker   . Smokeless tobacco: Never Used  . Alcohol Use: 3.5 oz/week    7 drink(s) per week     Comment: 6-7 glasses of wine a week  . Drug Use: No  . Sexual Activity: Yes    Partners: Male    Birth Control/ Protection: Post-menopausal   Other Topics Concern  . None   Social History Narrative  . None   Past Surgical History  Procedure Laterality Date  . Ankle surgery    . Eye surgery    . Retinal detachment surgery  june 28th 2012  . Tonsillectomy and adenoidectomy    . Breast surgery  8/09    (Rt)Lumpectomy,sentinel node  . Mastectomy, partial Right 07/31/08    right node Bx (neg)   Past Medical History  Diagnosis Date  . Hypertension   . Elevated cholesterol   . Infertility, female   . Fibroid   . Cancer 07/2008    breast cancer(rt)  . Basal cell cancer   . Degenerative disc disease  neck  . Spinal arthritis   . Aneurysm of aorta 8/09    4.1cm ascending aortic aneurysm on breast MRI   BP 149/77  Pulse 69  Resp 14  Ht 5\' 3"  (1.6 m)  Wt 145 lb 9.6 oz (66.044 kg)  BMI 25.80 kg/m2  SpO2 98%  Opioid Risk Score:   Fall Risk Score:     Review of Systems Positive for neck pain negative for arm weakness or arm numbness    Objective:   Physical Exam  Pelvic obliquity right iliac crest proximally 1 cm higher than left Lumbar range of motion normal for flexion 50% extension 50% lateral bending Levoconvex scoliosis upper lumbar Negative straight leg  raising  C spine ROM normal but pain over Lateral aspect of neck Pinprick normal bilateral upper extremities Strength 5/5 bilateral deltoid, bicep, tricep, grip Cervical range of motion 75% for flexion extension lateral rotation Tenderness to palpation  right upper medial scapular border also tenderness along the lateral masses of the neck Deep tendon reflexes normal in the upper extremity Bilateral shoulders have negative for impingement testing    Assessment & Plan:  #1. Lumbar spondylosis which has been relieved with radiofrequency neurotomy at LE and L4 medial branches and L5 dorsal ramus. Right side performed 07/27/2014 left side performed 08/21/2014. Discussed the usual duration which is 6-12 months. No need to repeat medial branch blocks prior to next radiofrequency.  2. Cervicalgia. Combination between myofascial pain as well as cervical spondylosis. Will perform trigger point injection today and schedule for C4 C5-C6 medial branch blocks next month  Trigger Point Injection  Indication: R levator scapula Myofascial pain not relieved by medication management and other conservative care.  Informed consent was obtained after describing risk and benefits of the procedure with the patient, this includes bleeding, bruising, infection and medication side effects.  The patient wishes to proceed and has given written consent.  The patient was placed in a seated position.  The R upper medial scap border area was marked and prepped with Betadine.  It was entered with a 25-gauge 1-1/2 inch needle and 1/2 mL of 1% lidocaine was injected into each of 2 trigger points, after negative draw back for blood.  The patient tolerated the procedure well.  Post procedure instructions were given.  Patient with immediate post injection improvements  3. Levoconvex scoliosis upper lumbar with leg length discrepancy recommend quarter and heel lift on left side

## 2014-09-24 NOTE — Patient Instructions (Signed)
Trigger Point Injection Trigger points are areas where you have muscle pain. A trigger point injection is a shot given in the trigger point to relieve that pain. A trigger point might feel like a knot in your muscle. It hurts to press on a trigger point. Sometimes the pain spreads out (radiates) to other parts of the body. For example, pressing on a trigger point in your shoulder might cause pain in your arm or neck. You might have one trigger point. Or, you might have more than one. People often have trigger points in their upper back and lower back. They also occur often in the neck and shoulders. Pain from a trigger point lasts for a long time. It can make it hard to keep moving. You might not be able to do the exercise or physical therapy that could help you deal with the pain. A trigger point injection may help. It does not work for everyone. But, it may relieve your pain for a few days or a few months. A trigger point injection does not cure long-lasting (chronic) pain. LET YOUR CAREGIVER KNOW ABOUT:  Any allergies (especially to latex, lidocaine, or steroids).  Blood-thinning medicines that you take. These drugs can lead to bleeding or bruising after an injection. They include:  Aspirin.  Ibuprofen.  Clopidogrel.  Warfarin.  Other medicines you take. This includes all vitamins, herbs, eyedrops, over-the-counter medicines, and creams.  Use of steroids.  Recent infections.  Past problems with numbing medicines.  Bleeding problems.  Surgeries you have had.  Other health problems. RISKS AND COMPLICATIONS A trigger point injection is a safe treatment. However, problems may develop, such as:  Minor side effects usually go away in 1 to 2 days. These may include:  Soreness.  Bruising.  Stiffness.  More serious problems are rare. But, they may include:  Bleeding under the skin (hematoma).  Skin infection.  Breaking off of the needle under your skin.  Lung  puncture.  The trigger point injection may not work for you. BEFORE THE PROCEDURE You may need to stop taking any medicine that thins your blood. This is to prevent bleeding and bruising. Usually these medicines are stopped several days before the injection. No other preparation is needed. PROCEDURE  A trigger point injection can be given in your caregiver's office or in a clinic. Each injection takes 2 minutes or less.  Your caregiver will feel for trigger points. The caregiver may use a marker to circle the area for the injection.  The skin over the trigger point will be washed with a germ-killing (antiseptic) solution.  The caregiver pinches the spot for the injection.  Then, a very thin needle is used for the shot. You may feel pain or a twitching feeling when the needle enters the trigger point.  A numbing solution may be injected into the trigger point. Sometimes a drug to keep down swelling, redness, and warmth (inflammation) is also injected.  Your caregiver moves the needle around the trigger zone until the tightness and twitching goes away.  After the injection, your caregiver may put gentle pressure over the injection site.  Then it is covered with a bandage. AFTER THE PROCEDURE  You can go right home after the injection.  The bandage can be taken off after a few hours.  You may feel sore and stiff for 1 to 2 days.  Go back to your regular activities slowly. Your caregiver may ask you to stretch your muscles. Do not do anything that takes   extra energy for a few days.  Follow your caregiver's instructions to manage and treat other pain. Document Released: 12/03/2011 Document Revised: 04/10/2013 Document Reviewed: 12/03/2011 [employer] Patient Information 2015 [employer]. This information is not intended to replace advice given to you by your health care provider. Make sure you discuss any questions you have with your health care provider.  

## 2014-10-12 ENCOUNTER — Other Ambulatory Visit: Payer: Self-pay

## 2014-10-23 ENCOUNTER — Ambulatory Visit (HOSPITAL_BASED_OUTPATIENT_CLINIC_OR_DEPARTMENT_OTHER): Payer: Medicare Other | Admitting: Physical Medicine & Rehabilitation

## 2014-10-23 ENCOUNTER — Encounter: Payer: Self-pay | Admitting: Physical Medicine & Rehabilitation

## 2014-10-23 ENCOUNTER — Encounter: Payer: Medicare Other | Attending: Physical Medicine & Rehabilitation

## 2014-10-23 VITALS — BP 117/83 | HR 76 | Resp 14 | Wt 147.6 lb

## 2014-10-23 DIAGNOSIS — M47812 Spondylosis without myelopathy or radiculopathy, cervical region: Secondary | ICD-10-CM | POA: Diagnosis not present

## 2014-10-23 NOTE — Progress Notes (Signed)
Right C4-C5-C6 cervical medial branch blocks Indication cervical spondylosis with axial pain which is only partially responsive to PT, medication management and trigger point injections. Pain interferes with activities of daily living and sleep  Informed consent was obtained after describing risks and benefits of the procedure with patient these include bleeding bruising and infection and she elects to proceed and has given written consent Patient placed in a left lateral decubitus position lateral cervical area was marked and prepped with Betadine. Skin wheal raised using 27-gauge 1/2 inch needle at 3 locations. Then a 25-gauge 1.5 inch needle was inserted under fluoroscopic guidance targeting the midpoint of the articular pillars at C4-C5 and C6. Independent access points. Once bone contact made AP view demonstrated proper needle positioning followed by injection of 0. 3 mL of 1% MPF lidocaine. Patient tolerated procedure well  Patient reports 100% pain relief on the right side postprocedure Repeated one month assuming that the effects of worn off.

## 2014-10-23 NOTE — Patient Instructions (Signed)
Medial branch blocks of the cervical spine at C4-C5-C6 were performed today. Please keep track of  pain levels.

## 2014-10-23 NOTE — Progress Notes (Signed)
  PROCEDURE RECORD [facility]   Name: Sandra Tapia DOB:07-11-1946 MRN: [id number]  Date:10/23/2014  Physician: Alysia Penna, MD    Nurse/CMA:Henrietta Cieslewicz RN  Allergies:  Allergies  Allergen Reactions  . Penicillins     Nausea & stomach pains    Consent Signed: Yes.    Is patient diabetic? No.  CBG today?   Pregnant: No. LMP: No LMP recorded. Patient is postmenopausal. (age 68-55)  Anticoagulants: no Anti-inflammatory: no Antibiotics: no  Procedure: Cervical Medial Branch Block trigger point Position: Left Lateral Start Time: 12:55 End Time:1:13 Fluoro Time: 23 sec  RN/CMA Biomedical engineer    Time 12:49 1:17    BP 117/83 137/66    Pulse 76 69    Respirations 14 14    O2 Sat 96 96    S/S 6 6    Pain Level 7 0/10     D/C home with Husband, [name], D/C instructions reviewed, and sits independently.

## 2014-10-29 ENCOUNTER — Encounter: Payer: Self-pay | Admitting: Physical Medicine & Rehabilitation

## 2014-11-20 ENCOUNTER — Encounter: Payer: Self-pay | Admitting: Physical Medicine & Rehabilitation

## 2014-11-20 ENCOUNTER — Encounter: Payer: Medicare Other | Attending: Physical Medicine & Rehabilitation

## 2014-11-20 ENCOUNTER — Ambulatory Visit (HOSPITAL_BASED_OUTPATIENT_CLINIC_OR_DEPARTMENT_OTHER): Payer: Medicare Other | Admitting: Physical Medicine & Rehabilitation

## 2014-11-20 VITALS — BP 128/74 | HR 78 | Resp 14 | Ht 63.0 in | Wt 148.0 lb

## 2014-11-20 DIAGNOSIS — M542 Cervicalgia: Secondary | ICD-10-CM

## 2014-11-20 DIAGNOSIS — M47812 Spondylosis without myelopathy or radiculopathy, cervical region: Secondary | ICD-10-CM | POA: Insufficient documentation

## 2014-11-20 NOTE — Progress Notes (Signed)
  PROCEDURE RECORD [facility]   Name: Sandra Tapia DOB:03-Feb-1946 MRN: [id number]  Date:11/20/2014  Physician: Alysia Penna, MD    Nurse/CMA: Travarus Trudo   Allergies:  Allergies  Allergen Reactions  . Penicillins     Nausea & stomach pains    Consent Signed: Yes.    Is patient diabetic? No.  CBG today? .  Pregnant: No. LMP: No LMP recorded. Patient is postmenopausal. (age 68-55)  Anticoagulants: no Anti-inflammatory: no Antibiotics: no  Procedure: MBB Cervial C4-5-6  Position: Left Lateral Start Time: 2:30pm  End Time:  2:38 Fluoro Time: 20  RN/CMA Riana Tessmer Aleiya Rye    time 2:12pm 2:44pm    BP 128/78 157/60    Pulse 78 66    Respirations 14 14    O2 Sat 98 98    S/S 6 6    Pain Level 2/10 0/10     D/C home with [name], [name], D/C instructions reviewed, and sits independently.

## 2014-11-20 NOTE — Patient Instructions (Addendum)
We performed cervical medial branch blocks C4 C5 C6 levels today on the right side. You need to monitor your usual neck pain levels and report at next visit. If this block was helpful but only temporary will then proceed to cervical medial branch RF

## 2014-11-20 NOTE — Progress Notes (Signed)
Right C4,5,6 Cervical medial branch blocks under fluoroscopic guidance  Indication: Cervical axial pain without radiculitis. Pain is unresponsive to conservative care and interferes with activities of daily living.  Informed consent was obtained after describing risks and benefits of the procedure with the patient this includes bleeding bruising and infection temporary or permanent paralysis. Patient elected to proceed and has given written consent. Patient placed in a lateral decubitus position. Fluoroscopic was suggested to insure adequate view of the articular pillars in a lateral view. Area was marked and prepped with Betadine. 1/2 cc of 1% lidocaine injected into the skin and subcutaneous tissue using a 27-gauge 0.5 inch needle. Then a 25-gauge 1.5 inch needle was inserted under fluoroscopic guidance first targeting the C4 articular pillar center. Bone contact made and confirmed under AP imaging. Then 0.5 mL of 1% MPF lidocaine injected. This same procedure was repeated at C5 and C6 using same technique. Patient tolerated the procedure well. Post procedure instructions given. Adequate postoperative monitoring was performed. See flowsheet

## 2015-01-04 ENCOUNTER — Encounter: Payer: Self-pay | Admitting: Physical Medicine & Rehabilitation

## 2015-01-04 ENCOUNTER — Ambulatory Visit (HOSPITAL_BASED_OUTPATIENT_CLINIC_OR_DEPARTMENT_OTHER): Payer: PPO | Admitting: Physical Medicine & Rehabilitation

## 2015-01-04 ENCOUNTER — Encounter: Payer: PPO | Attending: Physical Medicine & Rehabilitation

## 2015-01-04 DIAGNOSIS — M47812 Spondylosis without myelopathy or radiculopathy, cervical region: Secondary | ICD-10-CM | POA: Insufficient documentation

## 2015-01-04 DIAGNOSIS — M47817 Spondylosis without myelopathy or radiculopathy, lumbosacral region: Secondary | ICD-10-CM | POA: Diagnosis not present

## 2015-01-04 DIAGNOSIS — M533 Sacrococcygeal disorders, not elsewhere classified: Secondary | ICD-10-CM | POA: Diagnosis not present

## 2015-01-04 DIAGNOSIS — M542 Cervicalgia: Secondary | ICD-10-CM | POA: Diagnosis not present

## 2015-01-04 NOTE — Progress Notes (Signed)
Subjective:    Patient ID: Sandra Tapia, female    DOB: 09/29/1946, 69 y.o.   MRN: [id number] Nov 20, 2014 2:51 PM EST  11/20/2014   Right C4,5,6 Cervical medial branch blocks under fluoroscopic guidance    HPI Excellent results after right cervical medial branch blocks approximate 6 weeks ago. 0/10 pain currently.  07/27/2014 RightL5 dorsal ramus., Right L4 and Right L3 medial branch radio frequency neuropathy under fluoroscopic guidance   08/21/2014 L L345 RF  Not taking tramadol Takes one diclofenac in am  Just started walking again Pain Inventory Average Pain 0 Pain Right Now 0 My pain is intermittent  In the last 24 hours, has pain interfered with the following? General activity 0 Relation with others 0 Enjoyment of life 0 What TIME of day is your pain at its worst? N/A Sleep (in general) Fair  Pain is worse with: some activites Pain improves with: n/a Relief from Meds: 0  Mobility walk without assistance how many minutes can you walk? 30 ability to climb steps?  yes do you drive?  yes  Function retired  Neuro/Psych No problems in this area  Prior Studies Any changes since last visit?  no  Physicians involved in your care Any changes since last visit?  no   Family History  Problem Relation Age of Onset  . Hypertension Father   . Macular degeneration Father    History   Social History  . Marital Status: Married    Spouse Name: N/A    Number of Children: N/A  . Years of Education: N/A   Social History Main Topics  . Smoking status: Never Smoker   . Smokeless tobacco: Never Used  . Alcohol Use: 3.5 oz/week    7 drink(s) per week     Comment: 6-7 glasses of wine a week  . Drug Use: No  . Sexual Activity:    Partners: Male    Birth Control/ Protection: Post-menopausal   Other Topics Concern  . None   Social History Narrative   Past Surgical History  Procedure Laterality Date  . Ankle surgery    . Eye surgery    . Retinal  detachment surgery  june 28th 2012  . Tonsillectomy and adenoidectomy    . Breast surgery  8/09    (Rt)Lumpectomy,sentinel node  . Mastectomy, partial Right 07/31/08    right node Bx (neg)   Past Medical History  Diagnosis Date  . Hypertension   . Elevated cholesterol   . Infertility, female   . Fibroid   . Cancer 07/2008    breast cancer(rt)  . Basal cell cancer   . Degenerative disc disease     neck  . Spinal arthritis   . Aneurysm of aorta 8/09    4.1cm ascending aortic aneurysm on breast MRI   There were no vitals taken for this visit.  Opioid Risk Score:   Fall Risk Score: Low Fall Risk (0-5 points)  Review of Systems  Constitutional:       Night sweats  HENT: Negative.   Eyes: Negative.   Respiratory: Negative.   Cardiovascular: Negative.   Gastrointestinal: Negative.   Endocrine: Negative.   Genitourinary: Negative.   Musculoskeletal: Positive for back pain.  Skin: Negative.   Allergic/Immunologic: Negative.   Neurological: Negative.   Hematological: Negative.   Psychiatric/Behavioral: Negative.        Objective:   Physical Exam Cervical range of motion is full without pain Upper extremity strength is normal  Lower extremity strength is normal  Lumbar spine and thoracic spine showing levoconvex scoliosis Pain the right PSIS  Straight leg raising negative Negative Spurling's      Assessment & Plan:  1. Cervical spondylosis with Excellent response to C4 C5 C6 medial branch blocks under fluoroscopic guidance. No need for repeat injection at the current time. No need for RF at the current time. If her duration of responses 3 months or more would hold off on RF.    2. Lumbar spondylosis with thoraco lumbar scoliosis has had Good response2 L3 L4 L5 RF. Do not feel like it's wearing off yet.May have repeat procedure on the right side as early as 1 month from now if needed  3. Right PSIS pain likely sacroiliac we'll schedule for injection which will be  diagnostic plus minus therapeutic

## 2015-01-04 NOTE — Patient Instructions (Signed)
Right sacroiliac injection next visit

## 2015-01-22 ENCOUNTER — Encounter: Payer: Self-pay | Admitting: Physical Medicine & Rehabilitation

## 2015-01-22 ENCOUNTER — Ambulatory Visit (HOSPITAL_BASED_OUTPATIENT_CLINIC_OR_DEPARTMENT_OTHER): Payer: PPO | Admitting: Physical Medicine & Rehabilitation

## 2015-01-22 VITALS — BP 116/71 | HR 68 | Resp 14

## 2015-01-22 DIAGNOSIS — M533 Sacrococcygeal disorders, not elsewhere classified: Secondary | ICD-10-CM | POA: Diagnosis not present

## 2015-01-22 DIAGNOSIS — M47812 Spondylosis without myelopathy or radiculopathy, cervical region: Secondary | ICD-10-CM | POA: Diagnosis not present

## 2015-01-22 NOTE — Patient Instructions (Signed)
Sacroiliac injection was performed today. A combination of a naming medicine plus a cortisone medicine was injected. The injection was done under x-ray guidance. This procedure has been performed to help reduce low back and buttocks pain as well as potentially hip pain. The duration of this injection is variable lasting from hours to  Months. It may repeated if needed. 

## 2015-01-22 NOTE — Progress Notes (Signed)

## 2015-01-22 NOTE — Progress Notes (Signed)
  PROCEDURE RECORD [facility]   Name: Sandra Tapia DOB:09/30/1946 MRN: [id number]  Date:01/22/2015  Physician: Alysia Penna, MD    Nurse/CMA: Tannen Vandezande RN  Allergies:  Allergies  Allergen Reactions  . Penicillins     Nausea & stomach pains    Consent Signed: Yes.    Is patient diabetic? No.  CBG today?    Pregnant: No. LMP: No LMP recorded. Patient is postmenopausal. (age 69-55)  Anticoagulants: no Anti-inflammatory: no Antibiotics: no  Procedure: Right Sacroiliac Steroid Injection Position: Prone Start Time: 2:02 End Time:  2:04 Fluoro Time: 8 sec  RN/CMA Biomedical engineer    Time 1:50 2:06    BP 116/71 143/70    Pulse 68 65    Respirations 14 14    O2 Sat 97 97    S/S 6 6    Pain Level 6-7/10 0/10     D/C home with husband , [name], D/C instructions reviewed, and sits independently.

## 2015-02-05 ENCOUNTER — Ambulatory Visit (HOSPITAL_BASED_OUTPATIENT_CLINIC_OR_DEPARTMENT_OTHER): Payer: PPO | Admitting: Physical Medicine & Rehabilitation

## 2015-02-05 ENCOUNTER — Encounter: Payer: PPO | Attending: Physical Medicine & Rehabilitation

## 2015-02-05 ENCOUNTER — Encounter: Payer: Self-pay | Admitting: Physical Medicine & Rehabilitation

## 2015-02-05 VITALS — BP 128/76 | HR 71 | Resp 14

## 2015-02-05 DIAGNOSIS — M47812 Spondylosis without myelopathy or radiculopathy, cervical region: Secondary | ICD-10-CM

## 2015-02-05 DIAGNOSIS — M542 Cervicalgia: Secondary | ICD-10-CM | POA: Diagnosis not present

## 2015-02-05 DIAGNOSIS — M533 Sacrococcygeal disorders, not elsewhere classified: Secondary | ICD-10-CM | POA: Diagnosis not present

## 2015-02-05 DIAGNOSIS — M47817 Spondylosis without myelopathy or radiculopathy, lumbosacral region: Secondary | ICD-10-CM | POA: Diagnosis not present

## 2015-02-05 NOTE — Patient Instructions (Signed)
Next injection will be in about 2 weeks left sacroiliac

## 2015-02-05 NOTE — Progress Notes (Signed)
Subjective:    Patient ID: Sandra Tapia, female    DOB: 1946/11/19, 69 y.o.   MRN: [id number]  HPI Walking better 30 minute tolerance.    Neck injection Still effective  Questions about Cumulative effect of steroid injections, Total steroid dose since March 2015 has been 100 mg of Depo-Medrol equivalents, Well within the threshold of 360 mg per year  Pain Inventory Average Pain 4 Pain Right Now 6 My pain is sharp and aching  In the last 24 hours, has pain interfered with the following? General activity 5 Relation with others 5 Enjoyment of life 6 What TIME of day is your pain at its worst? daytime Sleep (in general) Fair  Pain is worse with: standing and some activites Pain improves with: rest, therapy/exercise and medication Relief from Meds: na  Mobility walk without assistance how many minutes can you walk? 30 ability to climb steps?  yes do you drive?  yes  Function retired I need assistance with the following:  meal prep, household duties and shopping  Neuro/Psych trouble walking  Prior Studies Any changes since last visit?  no  Physicians involved in your care Any changes since last visit?  no   Family History  Problem Relation Age of Onset  . Hypertension Father   . Macular degeneration Father    History   Social History  . Marital Status: Married    Spouse Name: N/A    Number of Children: N/A  . Years of Education: N/A   Social History Main Topics  . Smoking status: Never Smoker   . Smokeless tobacco: Never Used  . Alcohol Use: 3.5 oz/week    7 drink(s) per week     Comment: 6-7 glasses of wine a week  . Drug Use: No  . Sexual Activity:    Partners: Male    Birth Control/ Protection: Post-menopausal   Other Topics Concern  . None   Social History Narrative   Past Surgical History  Procedure Laterality Date  . Ankle surgery    . Eye surgery    . Retinal detachment surgery  june 28th 2012  . Tonsillectomy and  adenoidectomy    . Breast surgery  8/09    (Rt)Lumpectomy,sentinel node  . Mastectomy, partial Right 07/31/08    right node Bx (neg)   Past Medical History  Diagnosis Date  . Hypertension   . Elevated cholesterol   . Infertility, female   . Fibroid   . Cancer 07/2008    breast cancer(rt)  . Basal cell cancer   . Degenerative disc disease     neck  . Spinal arthritis   . Aneurysm of aorta 8/09    4.1cm ascending aortic aneurysm on breast MRI   BP 128/76 mmHg  Pulse 71  Resp 14  SpO2 97%  Opioid Risk Score:   Fall Risk Score: Low Fall Risk (0-5 points) Review of Systems  Constitutional: Positive for diaphoresis.  Musculoskeletal: Positive for back pain.  All other systems reviewed and are negative.      Objective:   Physical Exam  Positive Fabers, Left SI Tenderness palpation over the left PSIS greater than the right PSIS Negative straight leg raising test  Hip range of motion is good Gait is normal        Assessment & Plan:  1. Lumbar spondylosis as well as lumbar scoliosis causing chronic axial pain Right L3 L4 L5 RF 07/27/2014 Left L3 L4 L5 RF 08/21/2014  Still effective  2.  Bilateral sacroiliac disorder, good relief with right SI injection, we will schedule for left SI injection in 2 weeks  3. Cervical spondylosis mainly symptomatic on the right side with good relief lidocaine only medial branch blocks performed in October and November 2015 no recurrence thus far  Patient is  taking only nonsteroidal anti-inflammatories.

## 2015-03-05 ENCOUNTER — Ambulatory Visit (HOSPITAL_BASED_OUTPATIENT_CLINIC_OR_DEPARTMENT_OTHER): Payer: PPO | Admitting: Physical Medicine & Rehabilitation

## 2015-03-05 ENCOUNTER — Encounter: Payer: Self-pay | Admitting: Physical Medicine & Rehabilitation

## 2015-03-05 ENCOUNTER — Encounter: Payer: PPO | Attending: Physical Medicine & Rehabilitation

## 2015-03-05 VITALS — BP 131/71 | HR 69 | Resp 14

## 2015-03-05 DIAGNOSIS — M47812 Spondylosis without myelopathy or radiculopathy, cervical region: Secondary | ICD-10-CM | POA: Insufficient documentation

## 2015-03-05 DIAGNOSIS — M542 Cervicalgia: Secondary | ICD-10-CM | POA: Diagnosis not present

## 2015-03-05 DIAGNOSIS — M533 Sacrococcygeal disorders, not elsewhere classified: Secondary | ICD-10-CM | POA: Diagnosis not present

## 2015-03-05 NOTE — Progress Notes (Signed)

## 2015-03-05 NOTE — Patient Instructions (Signed)
Sacroiliac injection was performed today. A combination of a naming medicine plus a cortisone medicine was injected. The injection was done under x-ray guidance. This procedure has been performed to help reduce low back and buttocks pain as well as potentially hip pain. The duration of this injection is variable lasting from hours to  Months. It may repeated if needed. 

## 2015-03-05 NOTE — Progress Notes (Signed)
  PROCEDURE RECORD [facility]   Name: Sandra Tapia DOB:[birth year] MRN: [id number]  Date:03/05/2015  Physician: Alysia Penna, MD    Nurse/CMA: Shumaker RN  Allergies:  Allergies  Allergen Reactions  . Penicillins     Nausea & stomach pains    Consent Signed: Yes.    Is patient diabetic? No.  CBG today?   Pregnant: No. LMP: No LMP recorded. Patient is postmenopausal. (age 69-55)  Anticoagulants: no Anti-inflammatory: no Antibiotics: no  Procedure: left sacroiliac steroid injection Position: Prone Start Time:  End Time:  Fluoro Time:   RN/CMA Biomedical engineer    Time 1:57 2:30    BP 131/73 161/70    Pulse 69 63    Respirations 14 14    O2 Sat 97 97    S/S 6 6    Pain Level 7/10 0     D/C home with husband, [name], D/C instructions reviewed, and sits independently.

## 2015-03-15 ENCOUNTER — Telehealth: Payer: Self-pay | Admitting: Oncology

## 2015-03-15 NOTE — Telephone Encounter (Signed)
Lvm advising appt chg from 9/26 to 9/21 @ 12p with labs on 9/19. MyChart Active.

## 2015-05-03 ENCOUNTER — Encounter: Payer: Self-pay | Admitting: Physical Medicine & Rehabilitation

## 2015-05-03 ENCOUNTER — Ambulatory Visit (HOSPITAL_BASED_OUTPATIENT_CLINIC_OR_DEPARTMENT_OTHER): Payer: PPO | Admitting: Physical Medicine & Rehabilitation

## 2015-05-03 ENCOUNTER — Encounter: Payer: PPO | Attending: Physical Medicine & Rehabilitation

## 2015-05-03 VITALS — BP 120/75 | HR 67 | Resp 14

## 2015-05-03 DIAGNOSIS — M533 Sacrococcygeal disorders, not elsewhere classified: Secondary | ICD-10-CM | POA: Diagnosis not present

## 2015-05-03 DIAGNOSIS — M419 Scoliosis, unspecified: Secondary | ICD-10-CM | POA: Insufficient documentation

## 2015-05-03 DIAGNOSIS — M47817 Spondylosis without myelopathy or radiculopathy, lumbosacral region: Secondary | ICD-10-CM | POA: Diagnosis not present

## 2015-05-03 DIAGNOSIS — M542 Cervicalgia: Secondary | ICD-10-CM | POA: Insufficient documentation

## 2015-05-03 DIAGNOSIS — M47812 Spondylosis without myelopathy or radiculopathy, cervical region: Secondary | ICD-10-CM | POA: Insufficient documentation

## 2015-05-03 DIAGNOSIS — M4120 Other idiopathic scoliosis, site unspecified: Secondary | ICD-10-CM

## 2015-05-03 NOTE — Patient Instructions (Signed)
Plan repeat Radiofrequency on Right

## 2015-05-03 NOTE — Progress Notes (Signed)
Subjective:    Patient ID: Sandra Tapia, female    DOB: 1946-06-07, 69 y.o.   MRN: [id number]  HPI Increasing RIght sided low back pain with house work No pain at rest at the current time in her low back area Her neck is still doing well status post cervical medial branch blocks The sacroiliac injections were also helpful for her buttocks pain and she has not had any recurrence since that time   Pain Inventory Average Pain 6 Pain Right Now 6 My pain is sharp and aching  In the last 24 hours, has pain interfered with the following? General activity 7 Relation with others 5 Enjoyment of life 5 What TIME of day is your pain at its worst? daytime Sleep (in general) Good  Pain is worse with: walking, standing and some activites Pain improves with: rest, therapy/exercise, medication and injections Relief from Meds: no selection  Mobility walk without assistance how many minutes can you walk? 30 ability to climb steps?  yes do you drive?  yes transfers alone  Function retired I need assistance with the following:  household duties and shopping  Neuro/Psych numbness trouble walking  Prior Studies Any changes since last visit?  no  Physicians involved in your care Any changes since last visit?  no   Family History  Problem Relation Age of Onset  . Hypertension Father   . Macular degeneration Father    History   Social History  . Marital Status: Married    Spouse Name: N/A  . Number of Children: N/A  . Years of Education: N/A   Social History Main Topics  . Smoking status: Never Smoker   . Smokeless tobacco: Never Used  . Alcohol Use: 3.5 oz/week    7 drink(s) per week     Comment: 6-7 glasses of wine a week  . Drug Use: No  . Sexual Activity:    Partners: Male    Birth Control/ Protection: Post-menopausal   Other Topics Concern  . None   Social History Narrative   Past Surgical History  Procedure Laterality Date  . Ankle surgery    . Eye  surgery    . Retinal detachment surgery  june 28th 2012  . Tonsillectomy and adenoidectomy    . Breast surgery  8/09    (Rt)Lumpectomy,sentinel node  . Mastectomy, partial Right 07/31/08    right node Bx (neg)   Past Medical History  Diagnosis Date  . Hypertension   . Elevated cholesterol   . Infertility, female   . Fibroid   . Cancer 07/2008    breast cancer(rt)  . Basal cell cancer   . Degenerative disc disease     neck  . Spinal arthritis   . Aneurysm of aorta 8/09    4.1cm ascending aortic aneurysm on breast MRI   BP 120/75 mmHg  Pulse 67  Resp 14  SpO2 96%  Opioid Risk Score:   Fall Risk Score: Low Fall Risk (0-5 points)`1  Depression screen PHQ 2/9  No flowsheet data found.   Review of Systems  Constitutional:       Night sweats  Musculoskeletal: Positive for gait problem.  Neurological: Positive for numbness.  All other systems reviewed and are negative.      Objective:   Physical Exam  Constitutional: She is oriented to person, place, and time. She appears well-developed and well-nourished.  HENT:  Head: Normocephalic.  Neck: Normal range of motion.  Neurological: She is alert and oriented  to person, place, and time.  Psychiatric: She has a normal mood and affect.  Nursing note and vitals reviewed.  No pain to palpation along the cervical paraspinal muscles. No pain along the thoracic paraspinals there is no pain along the lumbar paraspinals. She has full lumbar flexion She has limited lumbar extension partially 50% with 50% lateral bending side-to-side She has a Levo convex thoracolumbar scoliosis       Assessment & Plan:  1. Lumbar spondylosis, recurrent pain approximate 10 months post radiofrequency neurotomy, plan repeat on the right side L3-L4 medial branch and L5 dorsal ramus RFA in 1 month.  We will then monitor the recurrence of left-sided low back pain and possibly repeat left-sided L3-4-5 RFA in the future  2. Cervical spondylosis  she's had a good prolonged relief from  C4 C5 C6 medial branch blocks, No need for RFA at this time   3. 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 Thoracolumbar scoliosis no signs of radiculopathy, no specific treatment needed at this time, takes very occasional tramadol

## 2015-05-06 ENCOUNTER — Ambulatory Visit: Payer: PPO | Admitting: Physical Medicine & Rehabilitation

## 2015-06-11 ENCOUNTER — Ambulatory Visit (HOSPITAL_BASED_OUTPATIENT_CLINIC_OR_DEPARTMENT_OTHER): Payer: PPO | Admitting: Physical Medicine & Rehabilitation

## 2015-06-11 ENCOUNTER — Encounter: Payer: PPO | Attending: Physical Medicine & Rehabilitation

## 2015-06-11 ENCOUNTER — Encounter: Payer: Self-pay | Admitting: Physical Medicine & Rehabilitation

## 2015-06-11 VITALS — BP 132/66 | HR 67 | Resp 14

## 2015-06-11 DIAGNOSIS — M542 Cervicalgia: Secondary | ICD-10-CM | POA: Diagnosis not present

## 2015-06-11 DIAGNOSIS — M47812 Spondylosis without myelopathy or radiculopathy, cervical region: Secondary | ICD-10-CM | POA: Diagnosis not present

## 2015-06-11 DIAGNOSIS — M47817 Spondylosis without myelopathy or radiculopathy, lumbosacral region: Secondary | ICD-10-CM | POA: Diagnosis not present

## 2015-06-11 NOTE — Progress Notes (Signed)
  PROCEDURE RECORD [facility]   Name: Sandra Tapia DOB:05/10/1946 MRN: [id number]  Date:06/11/2015  Physician: Alysia Penna, MD    Nurse/CMA: Brandin Stetzer RN  Allergies:  Allergies  Allergen Reactions  . Penicillins     Nausea & stomach pains    Consent Signed: Yes.    Is patient diabetic? No.  CBG today?   Pregnant: No. LMP: No LMP recorded. Patient is postmenopausal. (age 69-55)  Anticoagulants: no Anti-inflammatory: no Antibiotics: no  Procedure: right lumbar 3-4-5 radiofrequency neurotomy  Position: Prone Start Time: 2:33 End Time:2:53 Fluoro Time: 39 seconds  RN/CMA Biomedical engineer    Time 2:10 2:57    BP 132/66 139/64    Pulse 67 68    Respirations 14 14    O2 Sat 97 97    S/S 6 6    Pain Level 7/10 0/10     D/C home with husband, [name], D/C instructions reviewed, and sits independently.

## 2015-06-11 NOTE — Patient Instructions (Signed)

## 2015-06-11 NOTE — Progress Notes (Signed)
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  Informed consent was obtained after describing risks and benefits of the procedure with the patient, this includes bleeding, bruising, infection, paralysis and medication side effects. The patient wishes to proceed and has given written consent. The patient was placed in a prone position. The lumbar and sacral area was marked and prepped with Betadine. A 25-gauge 1-1/2 inch needle was inserted into the skin and subcutaneous tissue at 3 sites in one ML of 1% lidocaine was injected into each site. Then a 20-gauge 10 cm radio frequency needle with a 1 cm curved active tip was inserted targeting the Right S1 SAP/sacral ala junction. Bone contact was made and confirmed with lateral imaging. Sensory stimulation at 50 Hz followed by motor stimulation at 2 Hz confirm proper needle location followed by injection of one ML of the solution containing one ML of 4 mg per mL dexamethasone and 3 mL of 1% MPF lidocaine. Then the Right L5 SAP/transverse process junction was targeted. Bone contact was made and confirmed with lateral imaging. Sensory stimulation at 50 Hz followed by motor stimulation at 2 Hz confirm proper needle location followed by injection of one ML of the solution containing one ML of 4 mg per mL dexamethasone and 3 mL of 1% MPF lidocaine. Then the Right L4 SAP/transverse process junction was targeted. Bone contact was made and confirmed with lateral imaging. Sensory stimulation at 50 Hz followed by motor stimulation at 2 Hz confirm proper needle location followed by injection of one ML of the solution containing one ML of 4 mg per mL dexamethasone and 3 mL of 1% MPF lidocaine. Radio frequency lesion being at [facility] for 90 seconds  was performed. Needles were removed. Post procedure instructions and vital signs were performed. Patient tolerated procedure well. Followup appointment was given.

## 2015-06-24 ENCOUNTER — Other Ambulatory Visit: Payer: Self-pay

## 2015-07-15 ENCOUNTER — Ambulatory Visit: Payer: PPO | Admitting: Physical Medicine & Rehabilitation

## 2015-07-15 ENCOUNTER — Other Ambulatory Visit: Payer: Self-pay | Admitting: *Deleted

## 2015-07-22 ENCOUNTER — Ambulatory Visit (HOSPITAL_BASED_OUTPATIENT_CLINIC_OR_DEPARTMENT_OTHER): Payer: PPO | Admitting: Physical Medicine & Rehabilitation

## 2015-07-22 ENCOUNTER — Encounter: Payer: Self-pay | Admitting: Physical Medicine & Rehabilitation

## 2015-07-22 ENCOUNTER — Encounter: Payer: PPO | Attending: Physical Medicine & Rehabilitation

## 2015-07-22 VITALS — BP 120/61 | HR 66 | Resp 14

## 2015-07-22 DIAGNOSIS — M542 Cervicalgia: Secondary | ICD-10-CM | POA: Insufficient documentation

## 2015-07-22 DIAGNOSIS — M47812 Spondylosis without myelopathy or radiculopathy, cervical region: Secondary | ICD-10-CM | POA: Diagnosis not present

## 2015-07-22 DIAGNOSIS — M47817 Spondylosis without myelopathy or radiculopathy, lumbosacral region: Secondary | ICD-10-CM

## 2015-07-22 NOTE — Progress Notes (Signed)
Left L5 dorsal ramus., left L4 and left L3 medial branch radio frequency neuropathy under fluoroscopic guidance  Indication: Low back pain due to lumbar spondylosis which has been relieved on 2 occasions by greater than 50% by lumbar medial branch blocks at corresponding levels.  Informed consent was obtained after describing risks and benefits of the procedure with the patient, this includes bleeding, bruising, infection, paralysis and medication side effects. The patient wishes to proceed and has given written consent. The patient was placed in a prone position. The lumbar and sacral area was marked and prepped with Betadine. A 25-gauge 1-1/2 inch needle was inserted into the skin and subcutaneous tissue at 3 sites in one ML of 1% lidocaine was injected into each site. Then a 20-gauge 10cm cm radio frequency needle with a 1 cm curved active tip was inserted targeting the left S1 SAP/sacral ala junction. Bone contact was made and confirmed with lateral imaging. Sensory stimulation at 50 Hz followed by motor stimulation at 2 Hz confirm proper needle location followed by injection of one ML of the solution containing one ML of 4 mg per mL dexamethasone and 3 mL of 1% MPF lidocaine. Then the left L5 SAP/transverse process junction was targeted. Bone contact was made and confirmed with lateral imaging. Sensory stimulation at 50 Hz followed by motor stimulation at 2 Hz confirm proper needle location followed by injection of one ML of the solution containing one ML of 4 mg per mL dexamethasone and 3 mL of 1% MPF lidocaine. Then the left L4 SAP/transverse process junction was targeted. Bone contact was made and confirmed with lateral imaging. Sensory stimulation at 50 Hz followed by motor stimulation at 2 Hz confirm proper needle location followed by injection of one ML of the solution containing one ML of 4 mg per mL dexamethasone and 3 mL of 1% MPF lidocaine. Radio frequency lesion being at [facility] for 90 seconds was  performed. Needles were removed. Post procedure instructions and vital signs were performed. Patient tolerated procedure well. Followup appointment was given.

## 2015-07-22 NOTE — Progress Notes (Signed)
  PROCEDURE RECORD [facility]   Name: Sandra Tapia DOB:[birth year] MRN: [id number]  Date:07/22/2015  Physician: Alysia Penna, MD    Nurse/CMA: Mancel Parsons  Allergies:  Allergies  Allergen Reactions  . Penicillins     Nausea & stomach pains    Consent Signed: Yes.    Is patient diabetic? No.  CBG today?   Pregnant: No. LMP: No LMP recorded. Patient is postmenopausal. (age [age])  Anticoagulants: no Anti-inflammatory: no Antibiotics: yes (macrodantin for UTI)  Procedure: Left L3,4,5 radiofrequency neurotomy  Position: Prone Start Time: 9:19am   End Time: 9:40am  Fluoro Time: 40  RN/CMA Rolan Bucco Connar Keating    Time 8:40 am 9:45 am    BP 120/61 142/73    Pulse 66 68    Respirations 14 14    O2 Sat 95 96    S/S 6 6    Pain Level 5/10 1/10     D/C home with husband, [name], D/C instructions reviewed, and sits independently.

## 2015-07-22 NOTE — Patient Instructions (Signed)

## 2015-07-25 ENCOUNTER — Telehealth: Payer: Self-pay

## 2015-07-25 DIAGNOSIS — C50919 Malignant neoplasm of unspecified site of unspecified female breast: Secondary | ICD-10-CM

## 2015-07-25 NOTE — Telephone Encounter (Signed)
Pt called stating it is time for her annual diagnostic mammogram and she needs the order sent. Order printed and faxed to [facility]. Called pt back that order was faxed.

## 2015-08-19 ENCOUNTER — Encounter: Payer: Self-pay | Admitting: Cardiology

## 2015-08-19 ENCOUNTER — Ambulatory Visit (INDEPENDENT_AMBULATORY_CARE_PROVIDER_SITE_OTHER): Payer: PPO | Admitting: Cardiology

## 2015-08-19 ENCOUNTER — Ambulatory Visit: Payer: PPO | Admitting: Physical Medicine & Rehabilitation

## 2015-08-19 VITALS — BP 100/66 | HR 64 | Ht 63.0 in | Wt 141.0 lb

## 2015-08-19 DIAGNOSIS — M549 Dorsalgia, unspecified: Secondary | ICD-10-CM

## 2015-08-19 DIAGNOSIS — I10 Essential (primary) hypertension: Secondary | ICD-10-CM | POA: Diagnosis not present

## 2015-08-19 DIAGNOSIS — I77810 Thoracic aortic ectasia: Secondary | ICD-10-CM | POA: Diagnosis not present

## 2015-08-19 NOTE — Progress Notes (Signed)
[facility]. [address], [address], [place]  [address] Phone: [phone] Fax:  [phone]  Date:  08/19/2015   ID:  Sandra, Tapia [birth year], MRN [id number]  PCP:  Cari Caraway, MD   History of Present Illness: Sandra Tapia is a 69 y.o. female with aortic root of 4.1 -> 4.3 -> 4.35-->4.2 cm ascending aorta. She also has mild to moderate valvular disease. Prior MRI of chest demonstrated no evidence of coarctation. Normal ejection fraction.   Prior LDL cholesterol, she is on simvastatin, was under good control just above 100. She is also a recent breast cancer survivor with XRT/lumpectomy. No chemotherapy other than tamoxifen.  Prior exercise treadmill test reassuring. This was performed because of increasing dyspnea on exertion. She is no longer complaining of this symptom.  Last week she did have an isolated symptom left posterior rib pain when she woke up that was concerning to her. Changed with position. Or shortness of breath, no anterior chest discomfort. As the day went on, this pain resolved. She has had no weight loss, no bleeding, no syncope.    Wt Readings from Last 3 Encounters:  08/19/15 141 lb (63.957 kg)  11/20/14 148 lb (67.132 kg)  10/23/14 147 lb 9.6 oz (66.951 kg)     Past Medical History  Diagnosis Date  . Hypertension   . Elevated cholesterol   . Infertility, female   . Fibroid   . Cancer 07/2008    breast cancer(rt)  . Basal cell cancer   . Degenerative disc disease     neck  . Spinal arthritis   . Aneurysm of aorta 8/09    4.1cm ascending aortic aneurysm on breast MRI    Past Surgical History  Procedure Laterality Date  . Ankle surgery    . Eye surgery    . Retinal detachment surgery  june 28th 2012  . Tonsillectomy and adenoidectomy    . Breast surgery  8/09    (Rt)Lumpectomy,sentinel node  . Mastectomy, partial Right 07/31/08    right node Bx (neg)    Current Outpatient Prescriptions  Medication Sig Dispense  Refill  . alendronate (FOSAMAX) 70 MG tablet Take 70 mg by mouth once a week.     . ALPHAGAN P 0.1 % SOLN Place 1 drop into the left eye 2 (two) times daily.     Marland Kitchen aspirin 81 MG tablet Take 81 mg by mouth daily.    Marland Kitchen atorvastatin (LIPITOR) 20 MG tablet Take 40 mg by mouth daily.     . Biotin 10 MG CAPS Take 1 tablet by mouth daily. 1 tablet    . Calcium Carbonate Antacid (TUMS ULTRA 1000 PO) Take 3,000 mg by mouth daily.     . diclofenac (VOLTAREN) 75 MG EC tablet Take 75 mg by mouth 2 (two) times daily.    . fexofenadine (ALLEGRA) 180 MG tablet Take 180 mg by mouth daily.    . fish oil-omega-3 fatty acids 1000 MG capsule Take 3 g by mouth daily.     Marland Kitchen ketorolac (ACULAR) 0.5 % ophthalmic solution Place 4 drops into the left eye daily.     Marland Kitchen letrozole (FEMARA) 2.5 MG tablet Take 1 tablet (2.5 mg total) by mouth daily. 90 tablet 3  . lisinopril-hydrochlorothiazide (PRINZIDE,ZESTORETIC) 10-12.5 MG per tablet Take 1 tablet by mouth daily.    . Multiple Vitamin (MULTIVITAMIN) tablet Take 1 tablet by mouth daily.    . pilocarpine (PILOCAR) 2 % ophthalmic solution  Apply to eye as needed.     . tazarotene (AVAGE) 0.1 % cream Apply 1 application topically at bedtime.    . tretinoin (RETIN-A) 0.025 % cream Apply 1 application topically every other day.     . Vitamin D, Ergocalciferol, (DRISDOL) 50000 UNITS CAPS Take 1 capsule (50,000 Units total) by mouth every 14 (fourteen) days. 26 capsule 0   No current facility-administered medications for this visit.    Allergies:    Allergies  Allergen Reactions  . Penicillins     Nausea & stomach pains    Social History:  The patient  reports that she has never smoked. She has never used smokeless tobacco. She reports that she drinks about 3.5 oz of alcohol per week. She reports that she does not use illicit drugs.   Family History  Problem Relation Age of Onset  . Hypertension Father   . Macular degeneration Father     ROS:  Please see the history  of present illness.      PHYSICAL EXAM: VS:  BP 100/66 mmHg  Pulse 64  Ht 5\' 3"  (1.6 m)  Wt 141 lb (63.957 kg)  BMI 24.98 kg/m2 Well nourished, well developed, in no acute distress HEENT: normal, [place]/AT, EOMI Neck: no JVD, normal carotid upstroke, no bruit Cardiac:  normal S1, S2; RRR; no murmur Lungs:  clear to auscultation bilaterally, no wheezing, rhonchi or rales Abd: soft, nontender, no hepatomegaly, no bruits Ext: no edema, 2+ distal pulses Skin: warm and dry GU: deferred Neuro: no focal abnormalities noted, AAO x 3  EKG:  Today, 08/19/15-sinus rhythm, 64, nonspecific ST-T wave changes-08/17/14-sinus rhythm, no other abnormalities    ASSESSMENT AND PLAN:  1. Dilated aortic root-currently 42 mm. Stable. It has been quite stable for some time. We will continue to monitor. We will likely check echo and 2017. 2. Posterior rib/back pain. Having back radiofrequency therapy. Chronic. 3. Hyperlipidemia - continue atorvastatin 4. Essential hypertension-blood pressure quite low today. 100/66. She and Dr. Leonides Schanz recently had appointment and she will continue to monitor this. If it remains low, she may be able to withdrawal the HCTZ portion of her medication. She has tried this in the past and has been hypertensive. 5. I would like to see her back in one year. We will check an echocardiogram at that time.  Signed, Candee Furbish, MD [facility]  08/19/2015 2:32 PM

## 2015-08-19 NOTE — Patient Instructions (Signed)
Medication Instructions:  The current medical regimen is effective;  continue present plan and medications.  Please keep a check on your blood pressure at home.  Follow-Up: Follow up in 1 year with Dr. Marlou Porch.  You will receive a letter in the mail 2 months before you are due.  Please call us when you receive this letter to schedule your follow up appointment.

## 2015-09-03 ENCOUNTER — Ambulatory Visit (HOSPITAL_BASED_OUTPATIENT_CLINIC_OR_DEPARTMENT_OTHER): Payer: PPO | Admitting: Physical Medicine & Rehabilitation

## 2015-09-03 ENCOUNTER — Encounter: Payer: PPO | Attending: Physical Medicine & Rehabilitation

## 2015-09-03 ENCOUNTER — Encounter: Payer: Self-pay | Admitting: Physical Medicine & Rehabilitation

## 2015-09-03 VITALS — BP 115/73 | HR 72 | Resp 14

## 2015-09-03 DIAGNOSIS — M47812 Spondylosis without myelopathy or radiculopathy, cervical region: Secondary | ICD-10-CM | POA: Insufficient documentation

## 2015-09-03 DIAGNOSIS — M542 Cervicalgia: Secondary | ICD-10-CM | POA: Insufficient documentation

## 2015-09-03 DIAGNOSIS — M4120 Other idiopathic scoliosis, site unspecified: Secondary | ICD-10-CM | POA: Diagnosis not present

## 2015-09-03 DIAGNOSIS — M533 Sacrococcygeal disorders, not elsewhere classified: Secondary | ICD-10-CM | POA: Diagnosis not present

## 2015-09-03 DIAGNOSIS — M47817 Spondylosis without myelopathy or radiculopathy, lumbosacral region: Secondary | ICD-10-CM | POA: Diagnosis not present

## 2015-09-03 DIAGNOSIS — M419 Scoliosis, unspecified: Secondary | ICD-10-CM

## 2015-09-03 NOTE — Patient Instructions (Addendum)
Recommend walking 15 minutes twice per day   May need to reinject R sacroiliac at any time

## 2015-09-03 NOTE — Progress Notes (Signed)
Subjective:    Patient ID: Sandra Tapia, female    DOB: Jan 21, 1946, 69 y.o.   MRN: [id number] Chief complaint right-sided buttock pain HPI  Standing and sitting on a prolonged basis causes increased pain , tylenol arthritis  Pain is not interfering with sleep anymore.  No recent falls or injuries. She's had right-sided sacroiliac injections which were helpful last performed in March 2016  Neck pain is still doing well. She did have 2 sets of medial branch blocks were approximately 1 year ago.    Pain Inventory Average Pain 5 Pain Right Now 4 My pain is dull and aching  In the last 24 hours, has pain interfered with the following? General activity 5 Relation with others 5 Enjoyment of life 5 What TIME of day is your pain at its worst? daytime Sleep (in general) Good  Pain is worse with: sitting, standing and some activites Pain improves with: rest, therapy/exercise, pacing activities, medication and injections Relief from Meds: 5  Mobility walk without assistance how many minutes can you walk? 20 ability to climb steps?  yes do you drive?  yes Do you have any goals in this area?  yes  Function retired I need assistance with the following:  household duties and shopping  Neuro/Psych weakness  Prior Studies Any changes since last visit?  no  Physicians involved in your care Any changes since last visit?  no   Family History  Problem Relation Age of Onset  . Hypertension Father   . Macular degeneration Father    Social History   Social History  . Marital Status: Married    Spouse Name: N/A  . Number of Children: N/A  . Years of Education: N/A   Social History Main Topics  . Smoking status: Never Smoker   . Smokeless tobacco: Never Used  . Alcohol Use: 3.5 oz/week    7 drink(s) per week     Comment: 6-7 glasses of wine a week  . Drug Use: No  . Sexual Activity:    Partners: Male    Birth Control/ Protection: Post-menopausal   Other  Topics Concern  . None   Social History Narrative   Past Surgical History  Procedure Laterality Date  . Ankle surgery    . Eye surgery    . Retinal detachment surgery  june 28th 2012  . Tonsillectomy and adenoidectomy    . Breast surgery  8/09    (Rt)Lumpectomy,sentinel node  . Mastectomy, partial Right 07/31/08    right node Bx (neg)   Past Medical History  Diagnosis Date  . Hypertension   . Elevated cholesterol   . Infertility, female   . Fibroid   . Cancer 07/2008    breast cancer(rt)  . Basal cell cancer   . Degenerative disc disease     neck  . Spinal arthritis   . Aneurysm of aorta 8/09    4.1cm ascending aortic aneurysm on breast MRI   BP 115/73 mmHg  Pulse 72  Resp 14  SpO2 95%  Opioid Risk Score:   Fall Risk Score:  `1  Depression screen PHQ 2/9  Depression screen PHQ 2/9 07/22/2015  Decreased Interest 0  Down, Depressed, Hopeless 0  PHQ - 2 Score 0     Review of Systems  Constitutional: Positive for diaphoresis.  Neurological: Positive for weakness.  All other systems reviewed and are negative.      Objective:   Physical Exam  Constitutional: She is oriented to person, place,  and time. She appears well-developed and well-nourished.  HENT:  Head: Normocephalic and atraumatic.  Eyes: Conjunctivae and EOM are normal. Pupils are equal, round, and reactive to light.  Musculoskeletal:       Thoracic back: She exhibits deformity.       Lumbar back: She exhibits decreased range of motion and deformity. She exhibits no spasm.  Thoraco lumbar scoliosis convex to the left    Neurological: She is alert and oriented to person, place, and time.  Normal strength bilateral lower extremities  Psychiatric: She has a normal mood and affect.  Nursing note and vitals reviewed.         Assessment & Plan:  1. Lumbar spondylosis with good response to L3 L4 L5 radiofrequency procedures performed on the right in June 2016 and on the left  In July 2016.   Previous set of injections and ablations were performed approximately one year earlier. We can repeat as early as 6 months from now, difficult to predict effect duration but would be in the 6-12 month range  2. Right-sided buttock pain this is likely right sacroiliac now more noticeable after the radiofrequency procedure. Would recommend repeat sacroiliac injection last one performed 6 months ago  3. Cervical spondylosis with prolonged effect from C4 C5 C6 cervical medial branch blocks.  We discussed that she is no longer any pain medications she is now sleeping throughout the night without pain medications.

## 2015-09-16 ENCOUNTER — Other Ambulatory Visit (HOSPITAL_BASED_OUTPATIENT_CLINIC_OR_DEPARTMENT_OTHER): Payer: PPO

## 2015-09-16 DIAGNOSIS — C50819 Malignant neoplasm of overlapping sites of unspecified female breast: Secondary | ICD-10-CM

## 2015-09-16 DIAGNOSIS — C50919 Malignant neoplasm of unspecified site of unspecified female breast: Secondary | ICD-10-CM

## 2015-09-16 LAB — CBC WITH DIFFERENTIAL/PLATELET
BASO%: 0.7 % (ref 0.0–2.0)
BASOS ABS: 0.1 10*3/uL (ref 0.0–0.1)
EOS%: 2 % (ref 0.0–7.0)
Eosinophils Absolute: 0.2 10*3/uL (ref 0.0–0.5)
HCT: 39 % (ref 34.8–46.6)
HGB: 13.1 g/dL (ref 11.6–15.9)
LYMPH%: 18.7 % (ref 14.0–49.7)
MCH: 32.7 pg (ref 25.1–34.0)
MCHC: 33.6 g/dL (ref 31.5–36.0)
MCV: 97.4 fL (ref 79.5–101.0)
MONO#: 0.6 10*3/uL (ref 0.1–0.9)
MONO%: 7.2 % (ref 0.0–14.0)
NEUT%: 71.4 % (ref 38.4–76.8)
NEUTROS ABS: 5.5 10*3/uL (ref 1.5–6.5)
PLATELETS: 225 10*3/uL (ref 145–400)
RBC: 4.01 10*6/uL (ref 3.70–5.45)
RDW: 13.2 % (ref 11.2–14.5)
WBC: 7.7 10*3/uL (ref 3.9–10.3)
lymph#: 1.4 10*3/uL (ref 0.9–3.3)

## 2015-09-16 LAB — COMPREHENSIVE METABOLIC PANEL (CC13)
ALT: 36 U/L (ref 0–55)
AST: 32 U/L (ref 5–34)
Albumin: 4 g/dL (ref 3.5–5.0)
Alkaline Phosphatase: 43 U/L (ref 40–150)
Anion Gap: 8 mEq/L (ref 3–11)
BILIRUBIN TOTAL: 0.96 mg/dL (ref 0.20–1.20)
BUN: 13.5 mg/dL (ref 7.0–26.0)
CO2: 30 mEq/L — ABNORMAL HIGH (ref 22–29)
Calcium: 9.7 mg/dL (ref 8.4–10.4)
Chloride: 102 mEq/L (ref 98–109)
Creatinine: 0.8 mg/dL (ref 0.6–1.1)
EGFR: 75 mL/min/{1.73_m2} — ABNORMAL LOW (ref >90)
Glucose: 85 mg/dl (ref 70–140)
Potassium: 3.9 mEq/L (ref 3.5–5.1)
Sodium: 139 mEq/L (ref 136–145)
TOTAL PROTEIN: 6.8 g/dL (ref 6.4–8.3)

## 2015-09-18 ENCOUNTER — Ambulatory Visit (HOSPITAL_BASED_OUTPATIENT_CLINIC_OR_DEPARTMENT_OTHER): Payer: PPO | Admitting: Oncology

## 2015-09-18 VITALS — BP 146/79 | HR 64 | Temp 98.4°F | Resp 18 | Ht 63.0 in | Wt 141.5 lb

## 2015-09-18 DIAGNOSIS — C50919 Malignant neoplasm of unspecified site of unspecified female breast: Secondary | ICD-10-CM

## 2015-09-18 NOTE — Progress Notes (Signed)
ID: Sandra Tapia   DOB: 1946-01-06  MR#: [id number]  JQZ#:[id number]  PCP: Sandra Caraway, MD GYN: Sandra Tapia SU: Sandra Tapia OTHER MD: Sandra Tapia, Sandra Tapia Sandra Tapia's  CHIEF COMPLAINT: Estrogen receptor positive breast cancer  CURRENT TREATMENT: Letrozole, alendronate   BREAST CANCER HISTORY. From the original intake note:  She felt a soreness in her right breast, a little bit before the July 4 weekend, but on June 30 2008 particularly she palpated a mass, which was new.  She brought it to Dr. Julieta Tapia attention and she was set up for evaluation at the [facility].  Noted that she had screening mammography April 16, 2008, which found very dense breasts, but otherwise no worrisome findings.  Now, on July 6 the patient was found by Dr. Truddie Tapia to have a palpable abnormality in the upper, outer quadrant of the right breast.  By mammogram this was asymmetric with ill-defined margins and measured up to 2.1 cm.  Ultrasound showed this to measure 1.4 cm.  There was also a hypoechoic mass in the axilla measuring up to 2.6 cm, but morphologically it appeared as a normal lymph node.  Breast specific gamma imaging was performed July 8 and showed no abnormality on the left.  On the right, there was a single area of moderately intense activity measuring 1.3 cm maximally.  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  With this information the patient was referred to Dr. Dalbert Tapia and bilateral breast MRIs were obtained July 22.  This showed only the solitary mass in the lower outer quadrant of the right breast measuring 1.7 cm by MRI.  Accordingly on August 4, the patient proceeded to right lumpectomy and sentinel lymph node sampling.  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  Her subsequent history is as detailed below  INTERVAL HISTORY: Sandra Tapia returns today for followup of her breast cancer.she continues on letrozole, which she is tolerating moderately well. She still has significant vaginal dryness problems. She is using Replens and considering Premarin. She does have night sweats at times. She obtains it at an adequate price.  REVIEW OF SYSTEMS: She still has soreness or sensitivity in the lateral aspect of the surgical breast. A detailed review of systems is otherwise stable  PAST MEDICAL HISTORY: Past Medical History  Diagnosis Date  . Hypertension   . Elevated cholesterol   . Infertility, female   . Fibroid   . Cancer 07/2008    breast cancer(rt)  . Basal cell cancer   . Degenerative disc disease     neck  . Spinal arthritis   . Aneurysm of aorta 8/09    4.1cm ascending aortic aneurysm on breast MRI  She has history of seasonal allergies, a history of osteopenia, a history of moderate osteoarthritis for which she takes nonsteroidals twice daily, history of hyperlipidemia, history of hypertension, history of a newly found ascending aortic aneurysm measuring 4.1 cm and evaluated by Dr. Lysle Tapia, history of early cataracts.  PAST SURGICAL HISTORY: Past Surgical History  Procedure Laterality Date  . Ankle surgery    . Eye surgery    . Retinal detachment surgery  june 28th 2012  .  Tonsillectomy and adenoidectomy    . Breast surgery  8/09    (Rt)Lumpectomy,sentinel node  . Mastectomy, partial Right 07/31/08    right node Bx (neg)    FAMILY HISTORY (updated SEPT 2013) Family History  Problem Relation Age of Onset  . Hypertension Father   . Macular degeneration Father    The patient's parents are alive, though her father at 25 is declining.  The patient has one sister in good health.  There is no history of breast or ovarian  cancer in the family except for the patient's father's sister (one of two) who was diagnosed in her 64s.  GYNECOLOGIC HISTORY: The patient is Gx, P1, first pregnancy to term age 83, last menstrual period 2003.  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  Her husband, Sandra Tapia, is retired from Chirop[employer].  They have a daughter Sandra Tapia who works for [employer] and two grandchildren.  She lives in [place].  The patient attends [employer] in [place].    HEALTH MAINTENANCE: Social History  Substance Use Topics  . Smoking status: Never Smoker   . Smokeless tobacco: Never Used  . Alcohol Use: 3.5 oz/week    7 drink(s) per week     Comment: 6-7 glasses of wine a week     Allergies  Allergen Reactions  . Penicillins     Nausea & stomach pains    Current Outpatient Prescriptions  Medication Sig Dispense Refill  . alendronate (FOSAMAX) 70 MG tablet Take 70 mg by mouth once a week.     . ALPHAGAN P 0.1 % SOLN Place 1 drop into the left eye 2 (two) times daily.     Marland Kitchen aspirin 81 MG tablet Take 81 mg by mouth daily.    Marland Kitchen atorvastatin (LIPITOR) 20 MG tablet Take 40 mg by mouth daily.     . Biotin 10 MG CAPS Take 1 tablet by mouth daily. 1 tablet    . Calcium Carbonate Antacid (TUMS ULTRA 1000 PO) Take 3,000 mg by mouth daily.     . diclofenac (VOLTAREN) 75 MG EC tablet Take 75 mg by mouth 2 (two) times daily.    . fexofenadine (ALLEGRA) 180 MG tablet Take 180 mg by mouth daily.    . fish oil-omega-3 fatty acids 1000 MG capsule Take 3 g by mouth daily.     Marland Kitchen ketorolac (ACULAR) 0.5 % ophthalmic solution Place 4 drops into the left eye daily.     Marland Kitchen letrozole (FEMARA) 2.5 MG tablet Take 1 tablet (2.5 mg total) by mouth daily. 90 tablet 3  . lisinopril-hydrochlorothiazide (PRINZIDE,ZESTORETIC) 10-12.5 MG per tablet Take 1 tablet by mouth daily.    . Multiple Vitamin (MULTIVITAMIN) tablet Take 1  tablet by mouth daily.    . pilocarpine (PILOCAR) 2 % ophthalmic solution Apply to eye as needed.     . tazarotene (AVAGE) 0.1 % cream Apply 1 application topically at bedtime.    . tretinoin (RETIN-A) 0.025 % cream Apply 1 application topically every other day.     . Vitamin D, Ergocalciferol, (DRISDOL) 50000 UNITS CAPS Take 1 capsule (50,000 Units total) by mouth every 14 (fourteen) days. 26 capsule 0   No current facility-administered medications for this visit.    OBJECTIVE: Middle-aged white woman who appears stated age 69 Vitals:   09/18/15 1158  BP: 146/79  Pulse: 64  Temp: 98.4 F (36.9 C)  Resp: 18  Body mass index is 25.07 kg/(m^2).    ECOG FS: 1  Sclerae unicteric, EOMs intact Oropharynx clear, dentition in good repair No cervical or supraclavicular adenopathy Lungs no rales or rhonchi Heart regular rate and rhythm Abd soft, nontender, positive bowel sounds MSK no focal spinal tenderness, no upper extremity lymphedema Neuro: nonfocal, well oriented, appropriate affect Breasts: the right breast is status post lumpectomy and radiation. There are no palpable masses. There are no skin or nipple changes of concern. The right axilla is benign. The left breast is unremarkable.   LAB RESULTS: Lab Results  Component Value Date   WBC 7.7 09/16/2015   NEUTROABS 5.5 09/16/2015   HGB 13.1 09/16/2015   HCT 39.0 09/16/2015   MCV 97.4 09/16/2015   PLT 225 09/16/2015      Chemistry      Component Value Date/Time   NA 139 09/16/2015 1351   NA 138 09/11/2011 1353   K 3.9 09/16/2015 1351   K 4.0 09/11/2011 1353   CL 102 09/07/2012 1106   CL 101 09/11/2011 1353   CO2 30* 09/16/2015 1351   CO2 28 09/11/2011 1353   BUN 13.5 09/16/2015 1351   BUN 17 09/11/2011 1353   CREATININE 0.8 09/16/2015 1351   CREATININE 0.85 09/11/2011 1353      Component Value Date/Time   CALCIUM 9.7 09/16/2015 1351   CALCIUM 9.6 09/11/2011 1353   ALKPHOS 43 09/16/2015 1351   ALKPHOS 38*  09/11/2011 1353   AST 32 09/16/2015 1351   AST 32 09/11/2011 1353   ALT 36 09/16/2015 1351   ALT 27 09/11/2011 1353   BILITOT 0.96 09/16/2015 1351   BILITOT 0.6 09/11/2011 1353       Lab Results  Component Value Date   LABCA2 11 08/28/2010    No components found for: [id number]  No results for input(s): INR in the last 168 hours.  Urinalysis    Component Value Date/Time   COLORURINE YELLOW 07/26/2008 1145   APPEARANCEUR CLEAR 07/26/2008 1145   LABSPEC 1.008 07/26/2008 1145   PHURINE 7.5 07/26/2008 1145   GLUCOSEU NEGATIVE 07/26/2008 1145   HGBUR NEGATIVE 07/26/2008 1145   BILIRUBINUR NEGATIVE 07/26/2008 1145   KETONESUR NEGATIVE 07/26/2008 1145   PROTEINUR NEGATIVE 07/26/2008 1145   UROBILINOGEN 0.2 07/26/2008 1145   NITRITE NEGATIVE 07/26/2008 1145   LEUKOCYTESUR  07/26/2008 1145    NEGATIVE MICROSCOPIC NOT DONE ON URINES WITH NEGATIVE PROTEIN, BLOOD, LEUKOCYTES, NITRITE, OR GLUCOSE <1000 mg/dL.    STUDIES: No results found.  .  ASSESSMENT: 69 y.o. [place] woman, status post right lumpectomy and sentinel lymph node sampling August 2009 for a T1c N0, stage IA invasive ductal carcinoma, grade 1, which was strongly estrogen and progesterone receptor positive, HER-2 negative, with a low proliferation fraction  (1) adjuvant radiation completed October 2009,  (2) on tamoxifen between October of 2009 and October of 2013  (3) on letrozole a January 2014 through September 2016  (a) bone density at [facility] 08/12/2014showed improved osteopenia with a T score of -1.5  PLAN:  Genavieve has completed 6 years of antiestrogen seeing including 4 years of tamoxifen in 2 years of letrozole area did I am comfortable with her stopping antiestrogen's at this point. We did discuss the fact that we have data for continuing letrozole to a total of 10 years. The additional risk reduction amounts to about 3% of which only 1% is distant disease free survival. The other 2% are local  recurrence or contralateral recurrence. Given this data  she is comfortable stopping antiestrogen's at this point.  We can also stop the alendronate. Of course she could continue that if she wishes but we had prescribed it specifically to run concurrently with her letrozole.  Today we also discussed extensively how to deal with vaginal dryness issues. We do have program here (intimacy and pelvic health) aware she can learn the certain massage and other techniques which may be helpful long-term. In terms of lubrication's she is already on Replens but there are other options she can consider. Finally there is the use of local estrogen preparations. This would be the optimal treatment as far as vaginal restoration is concerned.  We did discuss the fact that there is practically no data on the use of estrogens in patients with breast cancer. When the patients are on tamoxifen, which blocks the estrogen receptor breast cancer cells, we feel less of a concern. Of course estrogens are contraindicated in aromatase inhibitors. In women who don't have a historyof breast cancer we feel that the use of vaginal estrogens is safe. We simply don't have the same level of certainty with women who have a history of breast cancer, a totally different population.  Accordingly it becomes a quality of life issue. If there is just no other way to accomplish this problem and it remains a major problem for her then at her own and her gynecologist's discretion she may proceed with local estrogen preparations for vaginal health.  As far as breast cancer follow-up is concerned all Margery will need is yearly mammography and a yearly physician breast exam.  I think Denym would do well in our new survivorship program and I am referring her there. I am not making her any further routine visits with me here, but I will be glad to see her at anytime in the future if and when the need arises.    MAGRINAT,GUSTAV C    09/18/2015

## 2015-09-20 ENCOUNTER — Telehealth: Payer: Self-pay | Admitting: Oncology

## 2015-09-20 NOTE — Telephone Encounter (Signed)
Mailed calendar for October  2017

## 2015-09-23 ENCOUNTER — Ambulatory Visit: Payer: Medicare Other | Admitting: Oncology

## 2015-10-08 ENCOUNTER — Encounter: Payer: PPO | Attending: Physical Medicine & Rehabilitation

## 2015-10-08 ENCOUNTER — Encounter: Payer: Self-pay | Admitting: Physical Medicine & Rehabilitation

## 2015-10-08 ENCOUNTER — Ambulatory Visit (HOSPITAL_BASED_OUTPATIENT_CLINIC_OR_DEPARTMENT_OTHER): Payer: PPO | Admitting: Physical Medicine & Rehabilitation

## 2015-10-08 VITALS — BP 143/70 | HR 60 | Resp 14

## 2015-10-08 DIAGNOSIS — M47812 Spondylosis without myelopathy or radiculopathy, cervical region: Secondary | ICD-10-CM | POA: Diagnosis not present

## 2015-10-08 DIAGNOSIS — M542 Cervicalgia: Secondary | ICD-10-CM | POA: Diagnosis not present

## 2015-10-08 DIAGNOSIS — M533 Sacrococcygeal disorders, not elsewhere classified: Secondary | ICD-10-CM

## 2015-10-08 NOTE — Patient Instructions (Signed)
Sacroiliac injection was performed today. A combination of a naming medicine plus a cortisone medicine was injected. The injection was done under x-ray guidance. This procedure has been performed to help reduce low back and buttocks pain as well as potentially hip pain. The duration of this injection is variable lasting from hours to  Months. It may repeated if needed. 

## 2015-10-08 NOTE — Progress Notes (Signed)
  PROCEDURE RECORD [facility]   Name: JANMARIE SMOOT DOB:[birth year] MRN: [id number]  Date:10/08/2015  Physician: Alysia Penna, MD    Nurse/CMA: Mancel Parsons Allergies:  Allergies  Allergen Reactions  . Penicillins     Nausea & stomach pains    Consent Signed: Yes.    Is patient diabetic? No.  CBG today?   Pregnant: No. LMP: No LMP recorded. Patient is postmenopausal. (age 69-55)  Anticoagulants: no Anti-inflammatory: no Antibiotics: no  Procedure: right sacroiliac steroid injection  Position: Prone Start Time: 10:56am  End Time: 10:58 am Fluoro Time: 10  RN/CMA Daryel November    Time 10:35am 11:01 am    BP 143/70 144/71    Pulse 60 60    Respirations 14 14    O2 Sat 99 98    S/S 6 6    Pain Level 4/10 0/10     D/C home with husband, [name], D/C instructions reviewed, and sits independently.

## 2015-11-05 ENCOUNTER — Encounter: Payer: Self-pay | Admitting: Physical Medicine & Rehabilitation

## 2015-11-05 ENCOUNTER — Ambulatory Visit: Payer: PPO | Admitting: Physical Medicine & Rehabilitation

## 2015-11-05 ENCOUNTER — Encounter: Payer: PPO | Attending: Physical Medicine & Rehabilitation

## 2015-11-05 VITALS — BP 138/65 | HR 61 | Resp 14

## 2015-11-05 DIAGNOSIS — M542 Cervicalgia: Secondary | ICD-10-CM | POA: Diagnosis not present

## 2015-11-05 DIAGNOSIS — M533 Sacrococcygeal disorders, not elsewhere classified: Secondary | ICD-10-CM

## 2015-11-05 DIAGNOSIS — M47812 Spondylosis without myelopathy or radiculopathy, cervical region: Secondary | ICD-10-CM | POA: Diagnosis not present

## 2015-11-05 NOTE — Progress Notes (Signed)
  PROCEDURE RECORD [facility]   Name: Sandra Tapia DOB:Sep 20, 1946 MRN: [id number]  Date:11/05/2015  Physician: Alysia Penna, MD    Nurse/CMA: Mancel Parsons  Allergies:  Allergies  Allergen Reactions  . Penicillins     Nausea & stomach pains    Consent Signed: Yes.    Is patient diabetic? No.  CBG today?   Pregnant: No. LMP: No LMP recorded. Patient is postmenopausal. (age 69-55)  Anticoagulants: no Anti-inflammatory: no Antibiotics: no  Procedure: left sacroiliac steroid injection Position: Prone Start Time: 3:30pm  End Time:  3:35pm      Fluoro Time: 7  RN/CMA Rolan Bucco Taleyah Hillman    Time 3:19pm 3:38pm    BP 138/65 155/74    Pulse 61 64    Respirations 214 14    O2 Sat 97 98    S/S 6 6    Pain Level 7/10 1/10     D/C home with husband, [name], D/C instructions reviewed, and sits independently.

## 2015-11-05 NOTE — Progress Notes (Signed)

## 2015-11-05 NOTE — Patient Instructions (Signed)
If you feel the right-sided low back pain is increasing or traveling a little higher toward the belt line, call to schedule repeat radiofrequency  The soonest we can repeat the radiofrequency procedure is in December  The soonest we can repeat the sacroiliac on the right is in January

## 2015-12-31 DIAGNOSIS — L728 Other follicular cysts of the skin and subcutaneous tissue: Secondary | ICD-10-CM | POA: Diagnosis not present

## 2015-12-31 DIAGNOSIS — D485 Neoplasm of uncertain behavior of skin: Secondary | ICD-10-CM | POA: Diagnosis not present

## 2016-01-16 ENCOUNTER — Encounter: Payer: Self-pay | Admitting: Physical Medicine & Rehabilitation

## 2016-01-16 ENCOUNTER — Ambulatory Visit (HOSPITAL_BASED_OUTPATIENT_CLINIC_OR_DEPARTMENT_OTHER): Payer: PPO | Admitting: Physical Medicine & Rehabilitation

## 2016-01-16 ENCOUNTER — Encounter: Payer: PPO | Attending: Physical Medicine & Rehabilitation

## 2016-01-16 VITALS — BP 126/57 | HR 60 | Resp 14

## 2016-01-16 DIAGNOSIS — L719 Rosacea, unspecified: Secondary | ICD-10-CM | POA: Diagnosis not present

## 2016-01-16 DIAGNOSIS — M150 Primary generalized (osteo)arthritis: Secondary | ICD-10-CM | POA: Diagnosis not present

## 2016-01-16 DIAGNOSIS — M85852 Other specified disorders of bone density and structure, left thigh: Secondary | ICD-10-CM | POA: Diagnosis not present

## 2016-01-16 DIAGNOSIS — M542 Cervicalgia: Secondary | ICD-10-CM | POA: Insufficient documentation

## 2016-01-16 DIAGNOSIS — E559 Vitamin D deficiency, unspecified: Secondary | ICD-10-CM | POA: Diagnosis not present

## 2016-01-16 DIAGNOSIS — M47812 Spondylosis without myelopathy or radiculopathy, cervical region: Secondary | ICD-10-CM | POA: Diagnosis not present

## 2016-01-16 DIAGNOSIS — I10 Essential (primary) hypertension: Secondary | ICD-10-CM | POA: Diagnosis not present

## 2016-01-16 DIAGNOSIS — E782 Mixed hyperlipidemia: Secondary | ICD-10-CM | POA: Diagnosis not present

## 2016-01-16 DIAGNOSIS — R300 Dysuria: Secondary | ICD-10-CM | POA: Diagnosis not present

## 2016-01-16 DIAGNOSIS — Z791 Long term (current) use of non-steroidal anti-inflammatories (NSAID): Secondary | ICD-10-CM | POA: Diagnosis not present

## 2016-01-16 DIAGNOSIS — M47817 Spondylosis without myelopathy or radiculopathy, lumbosacral region: Secondary | ICD-10-CM

## 2016-01-16 DIAGNOSIS — Z853 Personal history of malignant neoplasm of breast: Secondary | ICD-10-CM | POA: Diagnosis not present

## 2016-01-16 DIAGNOSIS — Z0000 Encounter for general adult medical examination without abnormal findings: Secondary | ICD-10-CM | POA: Diagnosis not present

## 2016-01-16 DIAGNOSIS — M545 Low back pain: Secondary | ICD-10-CM | POA: Diagnosis not present

## 2016-01-16 MED ORDER — GABAPENTIN 600 MG PO TABS: 600.0000 mg | ORAL_TABLET | Freq: Three times a day (TID) | ORAL | Status: DC

## 2016-01-16 NOTE — Progress Notes (Signed)
  PROCEDURE RECORD [facility]   Name: Sandra Tapia DOB:1946/07/26 MRN: [id number]  Date:01/16/2016  Physician: Alysia Penna, MD    Nurse/CMA: Mancel Parsons  Allergies:  Allergies  Allergen Reactions  . Penicillins     Nausea & stomach pains    Consent Signed: Yes.    Is patient diabetic? No.  CBG today? n/a  Pregnant: No. LMP: No LMP recorded. Patient is postmenopausal. (age 70-55)  Anticoagulants: no Anti-inflammatory: no Antibiotics: no  Procedure: right radiofrequency neurotomy     Position: Prone Start Time: 11:26am  End Time: 11:43am  Fluoro Time: 40  RN/CMA Rolan Bucco Perline Awe    Time 11:16am 11:45am    BP 126/57 125/69    Pulse 60 60    Respirations 14 14    O2 Sat 98 98    S/S 6 6    Pain Level 7/10 1/10     D/C home with husband, [name], D/C instructions reviewed, and sits independently.

## 2016-01-16 NOTE — Patient Instructions (Signed)

## 2016-01-16 NOTE — Progress Notes (Signed)
RightL5 dorsal ramus., Right L4 and Right L3 medial branch radio frequency neuropathy under fluoroscopic guidance   Indication: Low back pain due to lumbar spondylosis which has been relieved on 2 occasions by greater than 50% by lumbar medial branch blocks at corresponding levels.Radiofrequency at these levels performed in June 2016 was helpful for 6 months.  Medication use reduced, activity level improved.  Informed consent was obtained after describing risks and benefits of the procedure with the patient, this includes bleeding, bruising, infection, paralysis and medication side effects. The patient wishes to proceed and has given written consent. The patient was placed in a prone position. The lumbar and sacral area was marked and prepped with Betadine. A 25-gauge 1-1/2 inch needle was inserted into the skin and subcutaneous tissue at 3 sites in one ML of 1% lidocaine was injected into each site. Then a 20-gauge 10 cm radio frequency needle with a 1 cm curved active tip was inserted targeting the Right S1 SAP/sacral ala junction. Bone contact was made and confirmed with lateral imaging. Sensory stimulation at 50 Hz followed by motor stimulation at 2 Hz confirm proper needle location followed by injection of one ML of the solution containing one ML of 4 mg per mL dexamethasone and 3 mL of 1% MPF lidocaine. Then the Right L5 SAP/transverse process junction was targeted. Bone contact was made and confirmed with lateral imaging. Sensory stimulation at 50 Hz followed by motor stimulation at 2 Hz confirm proper needle location followed by injection of one ML of the solution containing one ML of 4 mg per mL dexamethasone and 3 mL of 1% MPF lidocaine. Then the Right L4 SAP/transverse process junction was targeted. Bone contact was made and confirmed with lateral imaging. Sensory stimulation at 50 Hz followed by motor stimulation at 2 Hz confirm proper needle location followed by injection of one ML of the solution  containing one ML of 4 mg per mL dexamethasone and 3 mL of 1% MPF lidocaine. Radio frequency lesion being at [facility] for 90 seconds was performed. Needles were removed. Post procedure instructions and vital signs were performed. Patient tolerated procedure well. Followup appointment was given.  Patient complaining of left-sided low back is starting to worsen once again. Last RFA performed in July 2016 L3 L4 L5. Patient also feels like the pain is traveling up more will add L2.

## 2016-01-21 DIAGNOSIS — E782 Mixed hyperlipidemia: Secondary | ICD-10-CM | POA: Diagnosis not present

## 2016-01-21 DIAGNOSIS — M150 Primary generalized (osteo)arthritis: Secondary | ICD-10-CM | POA: Diagnosis not present

## 2016-01-21 DIAGNOSIS — H6980 Other specified disorders of Eustachian tube, unspecified ear: Secondary | ICD-10-CM | POA: Diagnosis not present

## 2016-01-21 DIAGNOSIS — M545 Low back pain: Secondary | ICD-10-CM | POA: Diagnosis not present

## 2016-01-21 DIAGNOSIS — H3320 Serous retinal detachment, unspecified eye: Secondary | ICD-10-CM | POA: Diagnosis not present

## 2016-01-21 DIAGNOSIS — M85852 Other specified disorders of bone density and structure, left thigh: Secondary | ICD-10-CM | POA: Diagnosis not present

## 2016-01-21 DIAGNOSIS — I10 Essential (primary) hypertension: Secondary | ICD-10-CM | POA: Diagnosis not present

## 2016-01-21 DIAGNOSIS — L719 Rosacea, unspecified: Secondary | ICD-10-CM | POA: Diagnosis not present

## 2016-01-22 MED ORDER — GABAPENTIN 600 MG PO TABS: 600.0000 mg | ORAL_TABLET | Freq: Three times a day (TID) | ORAL | Status: DC

## 2016-01-22 NOTE — Addendum Note (Signed)
Addended by: Geryl Rankins D on: 01/22/2016 02:51 PM   Modules accepted: Orders

## 2016-01-29 DIAGNOSIS — L820 Inflamed seborrheic keratosis: Secondary | ICD-10-CM | POA: Diagnosis not present

## 2016-02-12 DIAGNOSIS — H353190 Nonexudative age-related macular degeneration, unspecified eye, stage unspecified: Secondary | ICD-10-CM | POA: Diagnosis not present

## 2016-02-12 DIAGNOSIS — H35352 Cystoid macular degeneration, left eye: Secondary | ICD-10-CM | POA: Diagnosis not present

## 2016-02-12 DIAGNOSIS — H33002 Unspecified retinal detachment with retinal break, left eye: Secondary | ICD-10-CM | POA: Diagnosis not present

## 2016-02-12 DIAGNOSIS — H43391 Other vitreous opacities, right eye: Secondary | ICD-10-CM | POA: Diagnosis not present

## 2016-02-13 ENCOUNTER — Encounter: Payer: PPO | Attending: Physical Medicine & Rehabilitation

## 2016-02-13 ENCOUNTER — Ambulatory Visit (HOSPITAL_BASED_OUTPATIENT_CLINIC_OR_DEPARTMENT_OTHER): Payer: PPO | Admitting: Physical Medicine & Rehabilitation

## 2016-02-13 ENCOUNTER — Encounter: Payer: Self-pay | Admitting: Physical Medicine & Rehabilitation

## 2016-02-13 VITALS — BP 148/65 | HR 67 | Resp 14

## 2016-02-13 DIAGNOSIS — M47817 Spondylosis without myelopathy or radiculopathy, lumbosacral region: Secondary | ICD-10-CM | POA: Diagnosis not present

## 2016-02-13 DIAGNOSIS — M47812 Spondylosis without myelopathy or radiculopathy, cervical region: Secondary | ICD-10-CM | POA: Diagnosis not present

## 2016-02-13 DIAGNOSIS — M542 Cervicalgia: Secondary | ICD-10-CM | POA: Insufficient documentation

## 2016-02-13 MED ORDER — GABAPENTIN 600 MG PO TABS: 600.0000 mg | ORAL_TABLET | Freq: Three times a day (TID) | ORAL | Status: DC

## 2016-02-13 NOTE — Progress Notes (Signed)
Left L5 dorsal ramus., left L4 and left L3, L2 medial branch radio frequency neuropathy under fluoroscopic guidance  Indication: Low back pain due to lumbar spondylosis which has been relieved on 2 occasions by greater than 50% by lumbar medial branch blocks at corresponding levels.  Informed consent was obtained after describing risks and benefits of the procedure with the patient, this includes bleeding, bruising, infection, paralysis and medication side effects. The patient wishes to proceed and has given written consent. The patient was placed in a prone position. The lumbar and sacral area was marked and prepped with Betadine. A 25-gauge 1-1/2 inch needle was inserted into the skin and subcutaneous tissue at 3 sites in one ML of 1% lidocaine was injected into each site. Then a 20-gauge 10cm cm radio frequency needle with a 1 cm curved active tip was inserted targeting the left S1 SAP/sacral ala junction. Bone contact was made and confirmed with lateral imaging. Sensory stimulation at 50 Hz followed by motor stimulation at 2 Hz confirm proper needle location followed by injection of one ML of the solution containing one ML of 4 mg per mL dexamethasone and 3 mL of 1% MPF lidocaine. Then the left L5 SAP/transverse process junction was targeted. Bone contact was made and confirmed with lateral imaging. Sensory stimulation at 50 Hz followed by motor stimulation at 2 Hz confirm proper needle location followed by injection of one ML of the solution containing one ML of 4 mg per mL dexamethasone and 3 mL of 1% MPF lidocaine. Then the left L4 SAP/transverse process junction was targeted. Bone contact was made and confirmed with lateral imaging. Sensory stimulation at 50 Hz followed by motor stimulation at 2 Hz confirm proper needle location followed by injection of one ML of the solution containing one ML of 4 mg per mL dexamethasone and 3 mL of 1% MPF lidocaine. Radio frequency lesion being at [facility] for 90 seconds  was performed.Then the left L3 SAP/transverse process junction was targeted. Bone contact was made and confirmed with lateral imaging. Sensory stimulation at 50 Hz followed by motor stimulation at 2 Hz confirm proper needle location followed by injection of one ML of the solution containing one ML of 4 mg per mL dexamethasone and 3 mL of 1% MPF lidocaine. Radio frequency lesion being at [facility] for 90 seconds was performed. During the lesioning of the L5 dorsal ramus and L4 medial branch, patient complained of some left foot sensation. This resolved with radiofrequency lesioning cessation. The L5 needle was adjusted and Lesioning was resumed for additional 90 seconds. The L2 and L3 medial branch radiofrequency lesioning proceeded 90 seconds without interruption. Needles were removed. Post procedure instructions and vital signs were performed. Patient tolerated procedure well. Followup appointment was given.

## 2016-02-13 NOTE — Progress Notes (Signed)
  PROCEDURE RECORD [facility]   Name: Sandra Tapia DOB:01/31/1946 MRN: [id number]  Date:02/13/2016  Physician: Alysia Penna, MD    Nurse/CMA: Gara Kroner, CMA Allergies:  Allergies  Allergen Reactions  . Penicillins     Nausea & stomach pains    Consent Signed: Yes.    Is patient diabetic? No.  CBG today? NA  Pregnant: No. LMP: No LMP recorded. Patient is postmenopausal. (age 70-55)  Anticoagulants: no Anti-inflammatory: no Antibiotics: no  Procedure: Radiofrequency Neurotomy Position: Prone Start Time: 1250 End Time: 1323  Fluoro Time: 1:18  RN/CMA Diann Bangerter [employer] [facility]	[place]    Time [id number]    BP 148/65 178/67    Pulse 67 62    Respirations 14 14    O2 Sat 96 99    S/S 6 6    Pain Level 6/10 0/10     D/C home with husband, [name], D/C instructions reviewed, and sits independently.

## 2016-02-13 NOTE — Patient Instructions (Signed)

## 2016-02-14 DIAGNOSIS — H1033 Unspecified acute conjunctivitis, bilateral: Secondary | ICD-10-CM | POA: Diagnosis not present

## 2016-02-20 DIAGNOSIS — B351 Tinea unguium: Secondary | ICD-10-CM | POA: Diagnosis not present

## 2016-02-20 DIAGNOSIS — L739 Follicular disorder, unspecified: Secondary | ICD-10-CM | POA: Diagnosis not present

## 2016-03-12 ENCOUNTER — Other Ambulatory Visit: Payer: Self-pay | Admitting: Physical Medicine & Rehabilitation

## 2016-03-12 ENCOUNTER — Encounter: Payer: Self-pay | Admitting: Physical Medicine & Rehabilitation

## 2016-03-12 ENCOUNTER — Ambulatory Visit (HOSPITAL_COMMUNITY)
Admission: RE | Admit: 2016-03-12 | Discharge: 2016-03-12 | Disposition: A | Discharge status: Home / Self Care | Payer: PPO | Source: Ambulatory Visit | Attending: Physical Medicine & Rehabilitation | Admitting: Physical Medicine & Rehabilitation

## 2016-03-12 ENCOUNTER — Encounter: Payer: PPO | Attending: Physical Medicine & Rehabilitation

## 2016-03-12 ENCOUNTER — Ambulatory Visit (HOSPITAL_BASED_OUTPATIENT_CLINIC_OR_DEPARTMENT_OTHER): Payer: PPO | Admitting: Physical Medicine & Rehabilitation

## 2016-03-12 VITALS — BP 144/85 | HR 69

## 2016-03-12 DIAGNOSIS — M4125 Other idiopathic scoliosis, thoracolumbar region: Secondary | ICD-10-CM | POA: Diagnosis not present

## 2016-03-12 DIAGNOSIS — M4120 Other idiopathic scoliosis, site unspecified: Secondary | ICD-10-CM

## 2016-03-12 DIAGNOSIS — M5136 Other intervertebral disc degeneration, lumbar region: Secondary | ICD-10-CM | POA: Insufficient documentation

## 2016-03-12 DIAGNOSIS — M542 Cervicalgia: Secondary | ICD-10-CM | POA: Insufficient documentation

## 2016-03-12 DIAGNOSIS — M47812 Spondylosis without myelopathy or radiculopathy, cervical region: Secondary | ICD-10-CM | POA: Insufficient documentation

## 2016-03-12 DIAGNOSIS — M41125 Adolescent idiopathic scoliosis, thoracolumbar region: Secondary | ICD-10-CM | POA: Diagnosis not present

## 2016-03-12 NOTE — Progress Notes (Signed)
Subjective:    Patient ID: Sandra Tapia, female    DOB: [birth year], 70 y.o.   MRN: [id number] 02/13/2016 left L2 L3 L4 medial branch and left L5 dorsal ramus radiofrequency ablation HPI  More sorness this time after the RF.  Pain is negligible when sitting but when gets up to do something it is present. We discussed that the most recent radiofrequency neurotomy had 4 ablation sites rather than 3.  Doing a lot better with long care rides Activity tolerance and pain threshold improved Overall the patient feels like this has been helpful.  January 2017 L3-L4 medial branch L5 dorsal ramus radiofrequency ablation still effective  November 2015 C4-5-6 MBB still effective Pain Inventory Average Pain varies with activity Pain Right Now 0 My pain is sharp, dull and aching  In the last 24 hours, has pain interfered with the following? General activity 5 Relation with others 5 Enjoyment of life 5 What TIME of day is your pain at its worst? daytime Sleep (in general) Good  Pain is worse with: walking, standing and some activites Pain improves with: rest, heat/ice, medication and injections Relief from Meds: 5  Mobility walk without assistance how many minutes can you walk? 20 ability to climb steps?  yes do you drive?  yes  Function retired I need assistance with the following:  meal prep, household duties and shopping  Neuro/Psych weakness numbness  Prior Studies Any changes since last visit?  no  Physicians involved in your care Any changes since last visit?  no   Family History  Problem Relation Age of Onset  . Hypertension Father   . Macular degeneration Father    Social History   Social History  . Marital Status: Married    Spouse Name: N/A  . Number of Children: N/A  . Years of Education: N/A   Social History Main Topics  . Smoking status: Never Smoker   . Smokeless tobacco: Never Used  . Alcohol Use: 3.5 oz/week    7 drink(s) per week   Comment: 6-7 glasses of wine a week  . Drug Use: No  . Sexual Activity:    Partners: Male    Birth Control/ Protection: Post-menopausal   Other Topics Concern  . None   Social History Narrative   Past Surgical History  Procedure Laterality Date  . Ankle surgery    . Eye surgery    . Retinal detachment surgery  june 28th 2012  . Tonsillectomy and adenoidectomy    . Breast surgery  8/09    (Rt)Lumpectomy,sentinel node  . Mastectomy, partial Right 07/31/08    right node Bx (neg)   Past Medical History  Diagnosis Date  . Hypertension   . Elevated cholesterol   . Infertility, female   . Fibroid   . Cancer ([place]) 07/2008    breast cancer(rt)  . Basal cell cancer   . Degenerative disc disease     neck  . Spinal arthritis   . Aneurysm of aorta ([place]) 8/09    4.1cm ascending aortic aneurysm on breast MRI   BP 144/85 mmHg  Pulse 69  SpO2 99%  Opioid Risk Score:   Fall Risk Score:  `1  Depression screen PHQ 2/9  Depression screen [facility] 2/9 03/12/2016 02/13/2016 07/22/2015  Decreased Interest 0 0 0  Down, Depressed, Hopeless 0 0 0  PHQ - 2 Score 0 0 0    Review of Systems  Constitutional: Positive for diaphoresis.       Night  sweats  Cardiovascular: Positive for leg swelling.  All other systems reviewed and are negative.      Objective:   Physical Exam  Constitutional: She is oriented to person, place, and time. She appears well-developed and well-nourished.  HENT:  Head: Normocephalic and atraumatic.  Eyes: Conjunctivae are normal. Pupils are equal, round, and reactive to light.  Neck: Normal range of motion.  Musculoskeletal: Normal range of motion.  Neurological: She is alert and oriented to person, place, and time. She has normal reflexes.  Psychiatric: She has a normal mood and affect.  Nursing note and vitals reviewed.   Moderately severe levoconvex scoliosis lumbar 5/5 strength bilateral hip flexor and extensor ankle dorsiflexor  Straight leg raising  neg  No tenderness to palpation lumbar paraspinal muscles. Gait shows no evidence of total drag or knee instability. No evidence of antalgia        Assessment & Plan:  1. Lumbar spondylosis which has responded to radiofrequency neurotomy right L3 L4 L5 in January 2017 and left L2-L3 L4-L5 in February 2017. As discussed with patient is difficult to exactly predict duration response but usually 6-12 months. It can be repeated as soon as 6 months post. She has had improved activity tolerance and decreased pain since procedures have been performed. She is better able to tolerate the weekly long car trip to visit her mother  2. Lumbar scoliosis appears moderately severe clinically we'll check thoracic lumbar films and monitor for progression. We discussed referral to orthopedic spine surgeon the patient will consider this but at this point does not want to pursue. We discussed conservative options including bracing which is unlikely to be helpful.  Patient and her husband had numerous questions about various treatment options in regards to her back pain and scoliosis. We discussed [facility] we discussed TENS units. She does not want to be on narcotic analgesics.  Over half of the 25 min visit was spent counseling and coordinating care.

## 2016-03-12 NOTE — Patient Instructions (Addendum)
Please let me know if you'd like a surgical referral  If you have increasing buttocks pain we can schedule you for a sacroiliac injection

## 2016-03-13 DIAGNOSIS — H40052 Ocular hypertension, left eye: Secondary | ICD-10-CM | POA: Diagnosis not present

## 2016-04-01 ENCOUNTER — Encounter: Payer: Self-pay | Admitting: Physical Medicine & Rehabilitation

## 2016-04-23 DIAGNOSIS — L918 Other hypertrophic disorders of the skin: Secondary | ICD-10-CM | POA: Diagnosis not present

## 2016-04-23 DIAGNOSIS — L820 Inflamed seborrheic keratosis: Secondary | ICD-10-CM | POA: Diagnosis not present

## 2016-06-03 DIAGNOSIS — L820 Inflamed seborrheic keratosis: Secondary | ICD-10-CM | POA: Diagnosis not present

## 2016-07-08 DIAGNOSIS — L720 Epidermal cyst: Secondary | ICD-10-CM | POA: Diagnosis not present

## 2016-07-08 DIAGNOSIS — D2339 Other benign neoplasm of skin of other parts of face: Secondary | ICD-10-CM | POA: Diagnosis not present

## 2016-07-09 ENCOUNTER — Telehealth: Payer: Self-pay | Admitting: Cardiology

## 2016-07-09 DIAGNOSIS — I77810 Thoracic aortic ectasia: Secondary | ICD-10-CM

## 2016-07-09 NOTE — Telephone Encounter (Signed)
Pt has an upcoming appt scheduled for 1 yr f/u with Dr Marlou Porch 8/22.  Will route to him for review and necessary order for echo to f/u dilated aortic root - if he would like echo prior to her appt.

## 2016-07-09 NOTE — Telephone Encounter (Signed)
NewMessage  Pt thought she may need Echo @ [facility]- 8/22- no order/recall in system. Please call back and discuss.

## 2016-07-10 NOTE — Telephone Encounter (Signed)
Order ECHO - dilated aortic root. Thanks  Candee Furbish, MD

## 2016-07-10 NOTE — Telephone Encounter (Signed)
Pt is aware echo has been ordered.  She would like it the same day as her appt.  Advised scheduling will be calling and she should tell them at that time.  She stated understanding.

## 2016-07-15 DIAGNOSIS — H353190 Nonexudative age-related macular degeneration, unspecified eye, stage unspecified: Secondary | ICD-10-CM | POA: Diagnosis not present

## 2016-07-15 DIAGNOSIS — H35352 Cystoid macular degeneration, left eye: Secondary | ICD-10-CM | POA: Diagnosis not present

## 2016-07-15 DIAGNOSIS — H40052 Ocular hypertension, left eye: Secondary | ICD-10-CM | POA: Diagnosis not present

## 2016-07-15 DIAGNOSIS — H33002 Unspecified retinal detachment with retinal break, left eye: Secondary | ICD-10-CM | POA: Diagnosis not present

## 2016-07-20 DIAGNOSIS — Z791 Long term (current) use of non-steroidal anti-inflammatories (NSAID): Secondary | ICD-10-CM | POA: Diagnosis not present

## 2016-07-20 DIAGNOSIS — Z853 Personal history of malignant neoplasm of breast: Secondary | ICD-10-CM | POA: Diagnosis not present

## 2016-07-20 DIAGNOSIS — M85852 Other specified disorders of bone density and structure, left thigh: Secondary | ICD-10-CM | POA: Diagnosis not present

## 2016-07-20 DIAGNOSIS — M150 Primary generalized (osteo)arthritis: Secondary | ICD-10-CM | POA: Diagnosis not present

## 2016-07-20 DIAGNOSIS — E559 Vitamin D deficiency, unspecified: Secondary | ICD-10-CM | POA: Diagnosis not present

## 2016-07-20 DIAGNOSIS — E782 Mixed hyperlipidemia: Secondary | ICD-10-CM | POA: Diagnosis not present

## 2016-07-20 DIAGNOSIS — L719 Rosacea, unspecified: Secondary | ICD-10-CM | POA: Diagnosis not present

## 2016-07-20 DIAGNOSIS — R300 Dysuria: Secondary | ICD-10-CM | POA: Diagnosis not present

## 2016-07-20 DIAGNOSIS — I10 Essential (primary) hypertension: Secondary | ICD-10-CM | POA: Diagnosis not present

## 2016-07-20 DIAGNOSIS — Z0000 Encounter for general adult medical examination without abnormal findings: Secondary | ICD-10-CM | POA: Diagnosis not present

## 2016-07-20 DIAGNOSIS — M545 Low back pain: Secondary | ICD-10-CM | POA: Diagnosis not present

## 2016-07-22 DIAGNOSIS — I712 Thoracic aortic aneurysm, without rupture: Secondary | ICD-10-CM | POA: Diagnosis not present

## 2016-07-22 DIAGNOSIS — I10 Essential (primary) hypertension: Secondary | ICD-10-CM | POA: Diagnosis not present

## 2016-07-22 DIAGNOSIS — Z7189 Other specified counseling: Secondary | ICD-10-CM | POA: Diagnosis not present

## 2016-07-22 DIAGNOSIS — E782 Mixed hyperlipidemia: Secondary | ICD-10-CM | POA: Diagnosis not present

## 2016-07-22 DIAGNOSIS — L719 Rosacea, unspecified: Secondary | ICD-10-CM | POA: Diagnosis not present

## 2016-07-22 DIAGNOSIS — M545 Low back pain: Secondary | ICD-10-CM | POA: Diagnosis not present

## 2016-07-22 DIAGNOSIS — M150 Primary generalized (osteo)arthritis: Secondary | ICD-10-CM | POA: Diagnosis not present

## 2016-07-22 DIAGNOSIS — Z791 Long term (current) use of non-steroidal anti-inflammatories (NSAID): Secondary | ICD-10-CM | POA: Diagnosis not present

## 2016-07-22 DIAGNOSIS — Z1389 Encounter for screening for other disorder: Secondary | ICD-10-CM | POA: Diagnosis not present

## 2016-07-22 DIAGNOSIS — Z0000 Encounter for general adult medical examination without abnormal findings: Secondary | ICD-10-CM | POA: Diagnosis not present

## 2016-07-22 DIAGNOSIS — M85852 Other specified disorders of bone density and structure, left thigh: Secondary | ICD-10-CM | POA: Diagnosis not present

## 2016-07-22 DIAGNOSIS — E559 Vitamin D deficiency, unspecified: Secondary | ICD-10-CM | POA: Diagnosis not present

## 2016-08-03 DIAGNOSIS — Z1211 Encounter for screening for malignant neoplasm of colon: Secondary | ICD-10-CM | POA: Diagnosis not present

## 2016-08-18 ENCOUNTER — Other Ambulatory Visit: Payer: Self-pay

## 2016-08-18 ENCOUNTER — Ambulatory Visit (HOSPITAL_COMMUNITY): Payer: PPO | Attending: Cardiology

## 2016-08-18 ENCOUNTER — Encounter: Payer: Self-pay | Admitting: Cardiology

## 2016-08-18 ENCOUNTER — Ambulatory Visit (INDEPENDENT_AMBULATORY_CARE_PROVIDER_SITE_OTHER): Payer: PPO | Admitting: Cardiology

## 2016-08-18 VITALS — BP 130/76 | HR 63 | Ht 63.0 in | Wt 143.0 lb

## 2016-08-18 DIAGNOSIS — I77810 Thoracic aortic ectasia: Secondary | ICD-10-CM

## 2016-08-18 DIAGNOSIS — R002 Palpitations: Secondary | ICD-10-CM | POA: Diagnosis not present

## 2016-08-18 DIAGNOSIS — I340 Nonrheumatic mitral (valve) insufficiency: Secondary | ICD-10-CM | POA: Diagnosis not present

## 2016-08-18 DIAGNOSIS — I351 Nonrheumatic aortic (valve) insufficiency: Secondary | ICD-10-CM | POA: Diagnosis not present

## 2016-08-18 DIAGNOSIS — I10 Essential (primary) hypertension: Secondary | ICD-10-CM

## 2016-08-18 DIAGNOSIS — I119 Hypertensive heart disease without heart failure: Secondary | ICD-10-CM | POA: Insufficient documentation

## 2016-08-18 NOTE — Progress Notes (Signed)
[facility] [address], [address] [place], [place]  [address] Phone: [phone] Fax:  [phone]  Date:  08/18/2016   ID:  Sandra Tapia, DOB [birth year], MRN [id number]  PCP:  Cari Caraway, MD   History of Present Illness: Sandra Tapia is a 70 y.o. female with aortic root of 4.1 -> 4.3 -> 4.35-->4.2 cm ascending aorta. She also has mild to moderate valvular disease. Prior MRI of chest demonstrated no evidence of coarctation. Normal ejection fraction.   Prior LDL cholesterol, she is on simvastatin, was under good control just above 100. She is also a recent breast cancer survivor with XRT/lumpectomy. No chemotherapy other than tamoxifen.  Prior exercise treadmill test reassuring. This was performed because of increasing dyspnea on exertion. She is no longer complaining of this symptom.  She will occasionally feel fluttering sensation, fleeting, periodic. No associated chest pain, shortness of breath. No syncope.   Wt Readings from Last 3 Encounters:  08/18/16 143 lb (64.9 kg)  09/18/15 141 lb 8 oz (64.2 kg)  08/19/15 141 lb (64 kg)     Past Medical History:  Diagnosis Date  . Aneurysm of aorta ([place]) 8/09   4.1cm ascending aortic aneurysm on breast MRI  . Basal cell cancer   . Cancer ([place]) 07/2008   breast cancer(rt)  . Degenerative disc disease    neck  . Elevated cholesterol   . Fibroid   . Hypertension   . Infertility, female   . Spinal arthritis     Past Surgical History:  Procedure Laterality Date  . ANKLE SURGERY    . BREAST SURGERY  8/09   (Rt)Lumpectomy,sentinel node  . EYE SURGERY    . MASTECTOMY, PARTIAL Right 07/31/08   right node Bx (neg)  . RETINAL DETACHMENT SURGERY  june 28th 2012  . TONSILLECTOMY AND ADENOIDECTOMY      Current Outpatient Prescriptions  Medication Sig Dispense Refill  . ALPHAGAN P 0.1 % SOLN Place 1 drop into the left eye 2 (two) times daily.     Marland Kitchen aspirin 81 MG tablet Take 81 mg by mouth daily.    Marland Kitchen atorvastatin  (LIPITOR) 20 MG tablet Take 40 mg by mouth daily.     . Biotin 10 MG CAPS Take 1 tablet by mouth daily. 1 tablet    . Calcium Carbonate Antacid (TUMS ULTRA 1000 PO) Take 3,000 mg by mouth daily.     . diclofenac (VOLTAREN) 75 MG EC tablet Take 75 mg by mouth 2 (two) times daily.    . fexofenadine (ALLEGRA) 180 MG tablet Take 180 mg by mouth daily.    . fish oil-omega-3 fatty acids 1000 MG capsule Take 3 g by mouth daily.     Marland Kitchen ketorolac (ACULAR) 0.4 % SOLN Apply 1 drop to eye 4 (four) times daily.    Marland Kitchen lisinopril-hydrochlorothiazide (PRINZIDE,ZESTORETIC) 10-12.5 MG per tablet Take 1 tablet by mouth daily.    . Multiple Vitamin (MULTIVITAMIN) tablet Take 1 tablet by mouth daily.    . pilocarpine (PILOCAR) 2 % ophthalmic solution Apply to eye as needed.     . tazarotene (AVAGE) 0.1 % cream Apply 1 application topically at bedtime.    . tretinoin (RETIN-A) 0.025 % cream Apply 1 application topically every other day.     . Vitamin D, Ergocalciferol, (DRISDOL) 50000 UNITS CAPS Take 1 capsule (50,000 Units total) by mouth every 14 (fourteen) days. 26 capsule 0   No current facility-administered medications for this visit.  Allergies:    Allergies  Allergen Reactions  . Penicillins     Nausea & stomach pains    Social History:  The patient  reports that she has never smoked. She has never used smokeless tobacco. She reports that she drinks about 3.5 oz of alcohol per week . She reports that she does not use drugs.   Family History  Problem Relation Age of Onset  . Hypertension Father   . Macular degeneration Father     ROS:  Please see the history of present illness.      PHYSICAL EXAM: VS:  BP 130/76   Pulse 63   Ht 5\' 3"  (1.6 m)   Wt 143 lb (64.9 kg)   SpO2 98%   BMI 25.33 kg/m  Well nourished, well developed, in no acute distress HEENT: normal, /AT, EOMI Neck: no JVD, normal carotid upstroke, no bruit Cardiac:  normal S1, S2; RRR; no murmur Lungs:  clear to auscultation  bilaterally, no wheezing, rhonchi or rales Abd: soft, nontender, no hepatomegaly, no bruits Ext: no edema, 2+ distal pulses Skin: warm and dry GU: deferred Neuro: no focal abnormalities noted, AAO x 3  EKG:  Today 08/18/16-sinus rhythm 63 with nonspecific ST-T wave changes, no change from prior personally viewed-, 08/19/15-sinus rhythm, 64, nonspecific ST-T wave changes-08/17/14-sinus rhythm, no other abnormalities    ECHO: 08/18/16 . - Aorta: Ascending aortic diameter: 72mm (S). - Ascending aorta: The ascending aorta was mildly dilated.  ASSESSMENT AND PLAN:  1. Dilated aortic root-currently 42 mm. once again Stable. It has been quite stable for some time. We will continue to monitor. We will likely check echo and 2019. 2. Hyperlipidemia - continue atorvastatin 3. Essential hypertension-blood pressure normal but has been quite low previously. 100/66. She and Dr. Leonides Schanz recently had appointment and she will continue to monitor this.  4. Palpitations-occasional flutter-like sensation, no high-risk symptoms such as syncope. 5. I would like to see her back in 2 years. We will check an echocardiogram at that time.  Signed, Candee Furbish, MD [facility]  08/18/2016 3:08 PM

## 2016-08-18 NOTE — Patient Instructions (Signed)
Medication Instructions:  The current medical regimen is effective;  continue present plan and medications.  Follow-Up: Follow up in 2 years with Dr. Skains.  You will receive a letter in the mail 2 months before you are due.  Please call us when you receive this letter to schedule your follow up appointment.  If you need a refill on your cardiac medications before your next appointment, please call your pharmacy.  Thank you for choosing [facility]!!     

## 2016-08-27 DIAGNOSIS — Z853 Personal history of malignant neoplasm of breast: Secondary | ICD-10-CM | POA: Diagnosis not present

## 2016-08-27 DIAGNOSIS — R921 Mammographic calcification found on diagnostic imaging of breast: Secondary | ICD-10-CM | POA: Diagnosis not present

## 2016-08-28 ENCOUNTER — Ambulatory Visit (HOSPITAL_BASED_OUTPATIENT_CLINIC_OR_DEPARTMENT_OTHER): Payer: PPO | Admitting: Physical Medicine & Rehabilitation

## 2016-08-28 ENCOUNTER — Encounter: Payer: Self-pay | Admitting: Physical Medicine & Rehabilitation

## 2016-08-28 ENCOUNTER — Encounter: Payer: PPO | Attending: Physical Medicine & Rehabilitation

## 2016-08-28 VITALS — BP 130/78 | HR 64 | Resp 14

## 2016-08-28 DIAGNOSIS — M47817 Spondylosis without myelopathy or radiculopathy, lumbosacral region: Secondary | ICD-10-CM | POA: Diagnosis not present

## 2016-08-28 DIAGNOSIS — M47899 Other spondylosis, site unspecified: Secondary | ICD-10-CM | POA: Diagnosis not present

## 2016-08-28 DIAGNOSIS — Z8679 Personal history of other diseases of the circulatory system: Secondary | ICD-10-CM | POA: Insufficient documentation

## 2016-08-28 DIAGNOSIS — M545 Low back pain: Secondary | ICD-10-CM | POA: Insufficient documentation

## 2016-08-28 DIAGNOSIS — M5030 Other cervical disc degeneration, unspecified cervical region: Secondary | ICD-10-CM | POA: Insufficient documentation

## 2016-08-28 DIAGNOSIS — Z85828 Personal history of other malignant neoplasm of skin: Secondary | ICD-10-CM | POA: Insufficient documentation

## 2016-08-28 DIAGNOSIS — M47896 Other spondylosis, lumbar region: Secondary | ICD-10-CM | POA: Diagnosis not present

## 2016-08-28 DIAGNOSIS — M4180 Other forms of scoliosis, site unspecified: Secondary | ICD-10-CM | POA: Diagnosis not present

## 2016-08-28 DIAGNOSIS — Z853 Personal history of malignant neoplasm of breast: Secondary | ICD-10-CM | POA: Diagnosis not present

## 2016-08-28 DIAGNOSIS — M4120 Other idiopathic scoliosis, site unspecified: Secondary | ICD-10-CM | POA: Diagnosis not present

## 2016-08-28 DIAGNOSIS — I10 Essential (primary) hypertension: Secondary | ICD-10-CM | POA: Insufficient documentation

## 2016-08-28 DIAGNOSIS — E7800 Pure hypercholesterolemia, unspecified: Secondary | ICD-10-CM | POA: Insufficient documentation

## 2016-08-28 DIAGNOSIS — M533 Sacrococcygeal disorders, not elsewhere classified: Secondary | ICD-10-CM | POA: Diagnosis not present

## 2016-08-28 NOTE — Patient Instructions (Signed)
Will give you a hand

## 2016-08-28 NOTE — Progress Notes (Signed)
Subjective:  02/13/2016 left L2 L3 L4 medial branch and left L5 dorsal ramus radiofrequency ablation  Patient ID: Sandra Tapia, female    DOB: 1946/04/15, 70 y.o.   MRN: [id number]  HPI Left sided low back pain increasing once again, noticed it over the last 2-3 months  Her pain is worse with long car rides. She is sometimes stretching her left hip during these rides. She has not tried reclining seat.  Right side low back is getting a little more sore but not like left   Walking 10-71minutes , Patient does better with walking and then she does with standing in place Pain Inventory Average Pain 5 Pain Right Now 5 My pain is burning and dull  In the last 24 hours, has pain interfered with the following? General activity 7 Relation with others 5 Enjoyment of life 5 What TIME of day is your pain at its worst? daytime Sleep (in general) Fair  Pain is worse with: walking, sitting, standing and some activites Pain improves with: rest, heat/ice, therapy/exercise, pacing activities and medication Relief from Meds: 3  Mobility walk without assistance how many minutes can you walk? 20 ability to climb steps?  yes do you drive?  yes transfers alone Do you have any goals in this area?  yes  Function not employed: date last employed . retired I need assistance with the following:  household duties and shopping  Neuro/Psych weakness numbness trouble walking  Prior Studies Any changes since last visit?  no  Physicians involved in your care Any changes since last visit?  no   Family History  Problem Relation Age of Onset  . Hypertension Father   . Macular degeneration Father    Social History   Social History  . Marital status: Married    Spouse name: N/A  . Number of children: N/A  . Years of education: N/A   Social History Main Topics  . Smoking status: Never Smoker  . Smokeless tobacco: Never Used  . Alcohol use 3.5 oz/week    7 drink(s) per week   Comment: 6-7 glasses of wine a week  . Drug use: No  . Sexual activity: Yes    Partners: Male    Birth control/ protection: Post-menopausal   Other Topics Concern  . None   Social History Narrative  . None   Past Surgical History:  Procedure Laterality Date  . ANKLE SURGERY    . BREAST SURGERY  8/09   (Rt)Lumpectomy,sentinel node  . EYE SURGERY    . MASTECTOMY, PARTIAL Right 07/31/08   right node Bx (neg)  . RETINAL DETACHMENT SURGERY  june 28th 2012  . TONSILLECTOMY AND ADENOIDECTOMY     Past Medical History:  Diagnosis Date  . Aneurysm of aorta ([place]) 8/09   4.1cm ascending aortic aneurysm on breast MRI  . Basal cell cancer   . Cancer ([place]) 07/2008   breast cancer(rt)  . Degenerative disc disease    neck  . Elevated cholesterol   . Fibroid   . Hypertension   . Infertility, female   . Spinal arthritis    BP 130/78 (BP Location: Left Arm, Patient Position: Sitting, Cuff Size: Normal)   Pulse 64   Resp 14   SpO2 93%   Opioid Risk Score:   Fall Risk Score:  `1  Depression screen PHQ 2/9  Depression screen [facility] 2/9 03/12/2016 02/13/2016 07/22/2015  Decreased Interest 0 0 0  Down, Depressed, Hopeless 0 0 0  PHQ - 2  Score 0 0 0    Review of Systems  Constitutional: Positive for diaphoresis.  HENT: Negative.   Eyes: Negative.   Respiratory: Negative.   Cardiovascular: Positive for leg swelling.  Gastrointestinal: Negative.   Endocrine: Negative.   Genitourinary: Negative.   Musculoskeletal: Positive for gait problem.  Skin: Negative.   Allergic/Immunologic: Negative.   Neurological: Positive for weakness and numbness.  Psychiatric/Behavioral: Negative.   All other systems reviewed and are negative.      Objective:   Physical Exam  Constitutional: She is oriented to person, place, and time. She appears well-developed and well-nourished.  HENT:  Head: Normocephalic and atraumatic.  Eyes: Conjunctivae and EOM are normal. Pupils are equal, round, and  reactive to light.  Musculoskeletal:  Patient has pelvic obliquity related to scoliosis  Neurological: She is alert and oriented to person, place, and time.  Motor strength is 5/5 bilateral hip flexor, knee extensor, ankle dorsiflexor    Psychiatric: She has a normal mood and affect.  Nursing note and vitals reviewed.  thoraco lumbar levoscoliosis centered around L1 Positive Faber's test, pain localizes to. Left sacroiliac      Assessment & Plan:  1. Increasing left-sided low back pain. She does have left sacroiliac localizing signs. She has a history of scoliosis, pelvic obliquity, which is likely predisposing this. In addition, she has a history of lumbar spondylosis but I do not think she is ready for repeat radiofrequency neurotomy. I have given her some handouts for sacroiliac stretching as well as core stabilization exercises. In addition, we'll do sacroiliac injection on the left side. Discussed with patient agrees with plan

## 2016-09-03 DIAGNOSIS — L820 Inflamed seborrheic keratosis: Secondary | ICD-10-CM | POA: Diagnosis not present

## 2016-09-11 ENCOUNTER — Encounter: Payer: Self-pay | Admitting: Physical Medicine & Rehabilitation

## 2016-09-11 ENCOUNTER — Ambulatory Visit (HOSPITAL_BASED_OUTPATIENT_CLINIC_OR_DEPARTMENT_OTHER): Payer: PPO | Admitting: Physical Medicine & Rehabilitation

## 2016-09-11 VITALS — BP 144/88 | HR 62 | Resp 14

## 2016-09-11 DIAGNOSIS — M533 Sacrococcygeal disorders, not elsewhere classified: Secondary | ICD-10-CM | POA: Diagnosis not present

## 2016-09-11 DIAGNOSIS — M545 Low back pain: Secondary | ICD-10-CM | POA: Diagnosis not present

## 2016-09-11 NOTE — Progress Notes (Signed)
  PROCEDURE RECORD [facility]   Name: LASHIEKA CABRIALES DOB:20-Aug-1946 MRN: [id number]  Date:09/11/2016  Physician: Alysia Penna, MD    Nurse/CMA: Mang Hazelrigg, CMA  Allergies:  Allergies  Allergen Reactions  . Penicillins     Nausea & stomach pains  156/71  Consent Signed: Yes.    Is patient diabetic? No.  CBG today? N/A  Pregnant: No. LMP: No LMP recorded. Patient is postmenopausal. (age 70-55)  Anticoagulants: no Anti-inflammatory: no Antibiotics: no  Procedure: lkeft sacroiliac steroid injection  Position: Prone Start Time: 2:45pm      End Time: 2:47pm       Fluoro Time: 11  RN/CMA Kamaree Wheatley, CMA Aralyn Nowak, CMA    Time 2:00pm 2:50pm    BP 144/88 156/71    Pulse 62 66    Respirations 14 14    O2 Sat 93 96    S/S 6 6    Pain Level 8/10 0/10     D/C home with husband, [name], D/C instructions reviewed, and sits independently.

## 2016-09-11 NOTE — Progress Notes (Signed)

## 2016-09-11 NOTE — Patient Instructions (Signed)
Sacroiliac injection was performed today. A combination of a naming medicine plus a cortisone medicine was injected. The injection was done under x-ray guidance. This procedure has been performed to help reduce low back and buttocks pain as well as potentially hip pain. The duration of this injection is variable lasting from hours to  Months. It may repeated if needed. 

## 2016-09-14 DIAGNOSIS — H40052 Ocular hypertension, left eye: Secondary | ICD-10-CM | POA: Diagnosis not present

## 2016-09-18 DIAGNOSIS — N3090 Cystitis, unspecified without hematuria: Secondary | ICD-10-CM | POA: Diagnosis not present

## 2016-09-18 DIAGNOSIS — N3001 Acute cystitis with hematuria: Secondary | ICD-10-CM | POA: Diagnosis not present

## 2016-10-01 DIAGNOSIS — R300 Dysuria: Secondary | ICD-10-CM | POA: Diagnosis not present

## 2016-10-01 DIAGNOSIS — R21 Rash and other nonspecific skin eruption: Secondary | ICD-10-CM | POA: Diagnosis not present

## 2016-10-07 ENCOUNTER — Encounter: Payer: PPO | Admitting: Nurse Practitioner

## 2016-10-08 ENCOUNTER — Ambulatory Visit (HOSPITAL_BASED_OUTPATIENT_CLINIC_OR_DEPARTMENT_OTHER): Payer: PPO | Admitting: Adult Health

## 2016-10-08 VITALS — BP 142/60 | HR 63 | Temp 98.4°F | Resp 18 | Ht 63.0 in | Wt 145.8 lb

## 2016-10-08 DIAGNOSIS — Z853 Personal history of malignant neoplasm of breast: Secondary | ICD-10-CM

## 2016-10-08 DIAGNOSIS — Z170 Estrogen receptor positive status [ER+]: Principal | ICD-10-CM

## 2016-10-08 DIAGNOSIS — C50411 Malignant neoplasm of upper-outer quadrant of right female breast: Secondary | ICD-10-CM

## 2016-10-08 NOTE — Progress Notes (Signed)
CLINIC:  Survivorship   REASON FOR VISIT:  Routine follow-up for history of breast cancer.   BRIEF ONCOLOGIC HISTORY:  (From Dr. Virgie Dad last note on 09/18/15)   INTERVAL HISTORY:  Ms. Mcmonigle presents to the [facility] today for routine follow-up for her history of breast cancer. She completed nearly 7 years of antiestrogen therapy with tamoxifen and letrozole; she completed therapy in 08/2015. She has some occasional fatigue, but "I just pace myself and I am okay." She endorses hot flashes/night sweats. She was taking both gabapentin & Effexor when she was on the Letrozole, and did not like the way these drugs made her feel.  She has some chronic back pain, which is largely unchanged from previous and not worse. She also has arthritis and feels like her muscles are weak, which she attributes to deconditioning. She tells me that she recently bought an inversion table, to help with her back pain. She does have a history of scoliosis and arthritis in her back, which are contributing most to her symptoms. She recently had a UTI/kidney infection; this was treated at an urgent care in [place]; she has no urinary symptoms today. She does endorse vaginal dryness.  She tells me her mood is good. Overall she reports feeling very well.   REVIEW OF SYSTEMS:  Review of Systems  Constitutional: Positive for malaise/fatigue.  HENT: Negative.   Respiratory: Negative.   Cardiovascular:       Occasional feet swelling  Gastrointestinal: Negative.   Genitourinary: Negative.   Musculoskeletal: Positive for back pain.       Some muscle weakness  Skin: Negative.   Neurological: Negative.   Endo/Heme/Allergies:       Hot flashes/night sweats  Psychiatric/Behavioral: Negative.   GU: Denies vaginal bleeding, discharge, (+) vaginal dryness.  Breast: Denies any new nodularity, masses, tenderness, nipple changes, or nipple discharge.    A 14-point review of systems was completed and was  negative, except as noted above.    PAST MEDICAL/SURGICAL HISTORY:  Past Medical History:  Diagnosis Date  . Aneurysm of aorta ([facility]) 8/09   4.1cm ascending aortic aneurysm on breast MRI  . Basal cell cancer   . Cancer ([place]) 07/2008   breast cancer(rt)  . Degenerative disc disease    neck  . Elevated cholesterol   . Fibroid   . Hypertension   . Infertility, female   . Spinal arthritis [facility])    Past Surgical History:  Procedure Laterality Date  . ANKLE SURGERY    . BREAST SURGERY  8/09   (Rt)Lumpectomy,sentinel node  . EYE SURGERY    . MASTECTOMY, PARTIAL Right 07/31/08   right node Bx (neg)  . RETINAL DETACHMENT SURGERY  june 28th 2012  . TONSILLECTOMY AND ADENOIDECTOMY       ALLERGIES:  Allergies  Allergen Reactions  . Penicillins     Nausea & stomach pains     CURRENT MEDICATIONS:  Outpatient Encounter Prescriptions as of 10/08/2016  Medication Sig Note  . ALPHAGAN P 0.1 % SOLN Place 1 drop into the left eye 2 (two) times daily.  03/13/2014: Received from: External Pharmacy  . aspirin 81 MG tablet Take 81 mg by mouth daily.   Marland Kitchen atorvastatin (LIPITOR) 20 MG tablet Take 40 mg by mouth daily.    . Biotin 10 MG CAPS Take 1 tablet by mouth daily. 1 tablet 03/15/2014: Received from: [facility]  . Calcium Carbonate Antacid (TUMS ULTRA 1000 PO) Take 3,000 mg by mouth daily.    Marland Kitchen  diclofenac (VOLTAREN) 75 MG EC tablet Take 75 mg by mouth 2 (two) times daily.   . fexofenadine (ALLEGRA) 180 MG tablet Take 180 mg by mouth daily.   . fish oil-omega-3 fatty acids 1000 MG capsule Take 3 g by mouth daily.    Marland Kitchen ketorolac (ACULAR) 0.4 % SOLN Apply 1 drop to eye 4 (four) times daily.   Marland Kitchen lisinopril-hydrochlorothiazide (PRINZIDE,ZESTORETIC) 10-12.5 MG per tablet Take 1 tablet by mouth daily.   . Multiple Vitamin (MULTIVITAMIN) tablet Take 1 tablet by mouth daily.   . pilocarpine (PILOCAR) 2 % ophthalmic solution Apply to eye as needed.  01/22/2015: Received from: [facility]  . tazarotene (AVAGE) 0.1 % cream Apply 1 application topically at bedtime.   . tretinoin (RETIN-A) 0.025 % cream Apply 1 application topically every other day.    . Vitamin D, Ergocalciferol, (DRISDOL) 50000 UNITS CAPS Take 1 capsule (50,000 Units total) by mouth every 14 (fourteen) days.    No facility-administered encounter medications on file as of 10/08/2016.      ONCOLOGIC FAMILY HISTORY:  Family History  Problem Relation Age of Onset  . Hypertension Father   . Macular degeneration Father     GENETIC COUNSELING/TESTING: None.  SOCIAL HISTORY:  CAITLINN KLINKER is married and lives with her husband in [place], [place]. They have 1 daughter, who works for [employer]. They have 2 grandchildren. Ms. Haider is currently retired; she previously worked as an Automotive engineer.  She denies any current tobacco or illicit drug use.  She drinks about 2 glasses of wine per day.   PHYSICAL EXAMINATION:  Vital Signs: Vitals:   10/08/16 1416  BP: (!) 142/60  Pulse: 63  Resp: 18  Temp: 98.4 F (36.9 C)   Filed Weights   10/08/16 1416  Weight: 145 lb 12.8 oz (66.1 kg)   General: Well-nourished, well-appearing female in no acute distress.  She is accompanied by her husband today.   HEENT: Head is normocephalic.  Pupils equal and reactive to light. Conjunctivae clear without exudate.  Sclerae anicteric. Oral mucosa is pink, moist.  Oropharynx is pink without lesions or erythema.  Lymph: No cervical, supraclavicular, or infraclavicular lymphadenopathy noted on palpation.  Cardiovascular: Regular rate and rhythm.Marland Kitchen Respiratory: Clear to auscultation bilaterally. Chest expansion symmetric; breathing non-labored.  Breast Exam:  -Left breast: No appreciable masses on palpation. No skin redness, thickening, or peau d'orange appearance; no nipple retraction or nipple discharge.   -Right breast: No appreciable masses on palpation. No skin redness, thickening, or  peau d'orange appearance; no nipple retraction or nipple discharge; healed lumpectomy scar without erythema or nodularity. -Axilla: No axillary adenopathy bilaterally.  GI: Abdomen soft and round; non-tender, non-distended. Bowel sounds normoactive. No hepatosplenomegaly.   GU: Deferred.  Neuro: No focal deficits. Steady gait.  Psych: Mood and affect normal and appropriate for situation.  Extremities: No edema. Skin: Warm and dry.  LABORATORY DATA:  None for this visit.   DIAGNOSTIC IMAGING:  Most recent mammogram: 07/2016; done at [facility] (copy of report not available for review)     ASSESSMENT AND PLAN:  Ms.. Goodroe is a pleasant 70 y.o. female with history of Stage IA right breast invasive ductal carcinoma, ER+/PR+/HER2-, diagnosed in 07/2008; treated with lumpectomy, adjuvant radiation therapy, and anti-estrogen therapy with tamoxifen from 09/2008-09/2012, then switched to letrozole in 12/2012-08/2015.  She presents to the Survivorship Clinic for surveillance and routine follow-up.   1. History of Stage IA right breast cancer:  Ms. Estill is  currently clinically and radiographically without evidence of disease or recurrence of breast cancer. She is now 8 years out from her diagnosis of breast cancer. This is very favorable. We discussed the option of "graduating" from follow-up. The cancer center versus continuing to be seen once per year in the survivorship clinic. She has regular follow-up visits with her PCP, and elects to graduate from follow-up today. This is completely reasonable and I encouraged her to call us anytime with any questions or concerns that may arise in the future. I wish her the best. She was provided with a "survivorship award", on during her transition to survivorship in graduation from follow-up at the cancer center.  2. Hot flashes/Night sweats: Ms. Hollick continues to have some hot flashes/night sweats, despite initiating antiestrogen therapy nearly one year  ago. This could be related to her hormone fluctuations with menopause. She is not interested in trying any prescription pharmacologic management at this time. Therefore I encouraged her to try vitamin E OTC supplements, as these have often been effective for managing hot flashes related to menopause and some women. They're largely no side effects from vitamin E, so it is likely not to be harmful if she would like to try this instead of a prescription medicine. I encouraged her to call me if she changes her mind, and I would be happy to call in gabapentin 300 mg by mouth daily at bedtime to help manage the night sweats. She agreed with this plan.  3. Vaginal dryness: Vaginal dryness is likely secondary to menopause. We discussed that coconut oil is often ineffective vaginal moisturizer, as well as as a vaginal lubricant for intercourse. I gave her instructions on how to use the coconut oil vaginally, and she could continue to use OTC vaginal moisturizers or lubricants in addition to the coconut oil as well.  4. Bone health:  Given Ms. Iwasaki's age, history of breast cancer, and history of aromatase inhibitor use, she is at risk for bone demineralization. Her last DEXA scan was on 08/08/13 and showed osteopenia. Given that she has completed her aromatase inhibitor therapy, I will defer any future bone mineral density testing to her PCP, as clinically indicated.  In the meantime, she was encouraged to increase her consumption of foods rich in calcium, as well as increase her weight-bearing activities.  She was given education on specific food and activities to promote bone health.  5. Health maintenance and wellness promotion: Ms. Mervine was encouraged to consume 5-7 servings of fruits and vegetables per day. She was also encouraged to engage in moderate to vigorous exercise for 30 minutes per day most days of the week. She was instructed to limit her alcohol consumption and continue to abstain from tobacco  use.    Dispo:  -"Graduate" from follow-up here at the cancer center today.   -Encouraged her to contact us at any time in the future with any questions or concerns. I wished her well.    A total of 30 minutes of face-to-face time was spent with this patient with greater than 50% of that time in counseling and care-coordination.   Mike Craze, NP Survivorship Program [place] [phone]   Note: PRIMARY CARE PROVIDER [facility], [place] [phone]

## 2016-10-10 ENCOUNTER — Encounter: Payer: Self-pay | Admitting: Adult Health

## 2016-10-12 ENCOUNTER — Ambulatory Visit (HOSPITAL_BASED_OUTPATIENT_CLINIC_OR_DEPARTMENT_OTHER): Payer: PPO | Admitting: Physical Medicine & Rehabilitation

## 2016-10-12 ENCOUNTER — Encounter: Payer: PPO | Attending: Physical Medicine & Rehabilitation

## 2016-10-12 ENCOUNTER — Encounter: Payer: Self-pay | Admitting: Physical Medicine & Rehabilitation

## 2016-10-12 VITALS — BP 133/79 | HR 65 | Resp 14

## 2016-10-12 DIAGNOSIS — M47896 Other spondylosis, lumbar region: Secondary | ICD-10-CM | POA: Insufficient documentation

## 2016-10-12 DIAGNOSIS — M4180 Other forms of scoliosis, site unspecified: Secondary | ICD-10-CM | POA: Diagnosis not present

## 2016-10-12 DIAGNOSIS — M419 Scoliosis, unspecified: Secondary | ICD-10-CM | POA: Diagnosis not present

## 2016-10-12 DIAGNOSIS — M47899 Other spondylosis, site unspecified: Secondary | ICD-10-CM | POA: Diagnosis not present

## 2016-10-12 DIAGNOSIS — Z85828 Personal history of other malignant neoplasm of skin: Secondary | ICD-10-CM | POA: Insufficient documentation

## 2016-10-12 DIAGNOSIS — G8929 Other chronic pain: Secondary | ICD-10-CM | POA: Insufficient documentation

## 2016-10-12 DIAGNOSIS — E7800 Pure hypercholesterolemia, unspecified: Secondary | ICD-10-CM | POA: Insufficient documentation

## 2016-10-12 DIAGNOSIS — I10 Essential (primary) hypertension: Secondary | ICD-10-CM | POA: Insufficient documentation

## 2016-10-12 DIAGNOSIS — M545 Low back pain: Secondary | ICD-10-CM | POA: Diagnosis not present

## 2016-10-12 DIAGNOSIS — M5030 Other cervical disc degeneration, unspecified cervical region: Secondary | ICD-10-CM | POA: Diagnosis not present

## 2016-10-12 DIAGNOSIS — M5442 Lumbago with sciatica, left side: Secondary | ICD-10-CM

## 2016-10-12 DIAGNOSIS — Z8679 Personal history of other diseases of the circulatory system: Secondary | ICD-10-CM | POA: Diagnosis not present

## 2016-10-12 DIAGNOSIS — Z853 Personal history of malignant neoplasm of breast: Secondary | ICD-10-CM | POA: Insufficient documentation

## 2016-10-12 NOTE — Patient Instructions (Signed)
Next injection Left L4 Transforaminal epidural injection

## 2016-10-12 NOTE — Progress Notes (Signed)
Subjective:    Patient ID: Sandra Tapia, female    DOB: 03-14-1946, 70 y.o.   MRN: [id number]  HPI 70 year old female with history of chronic neck and chronic low back pain. Her neck pain improved after cervical medial branch blocks and has had no recurrence after more than one year.  She has undergone left L3, L4, L5 radiofrequency neurotomy in February 2017. She complained of increasing low back pain starting in August 2017. However, this was lower down in the buttock. She underwent left sacroiliac injection and reported a post injection pain of 0/10 preinjection 8. However, she states that the injection was not helpful for her. We discussed the area of her pain and she points to an area in the lower buttock area. She states that it hurts to sit on that area. She also notes some intermittent numbness and tingling in the lateral calf as well as the medial ankle line  Pain Inventory Average Pain 6 Pain Right Now 7 My pain is sharp, dull and aching  In the last 24 hours, has pain interfered with the following? General activity 5 Relation with others 5 Enjoyment of life 5 What TIME of day is your pain at its worst? daytime Sleep (in general) Fair  Pain is worse with: walking, inactivity, standing and some activites Pain improves with: rest, heat/ice, therapy/exercise, pacing activities, medication and injections Relief from Meds: 4  Mobility walk without assistance how many minutes can you walk? 20 ability to climb steps?  yes do you drive?  yes transfers alone Do you have any goals in this area?  yes  Function retired I need assistance with the following:  meal prep, household duties and shopping  Neuro/Psych weakness numbness trouble walking  Prior Studies Any changes since last visit?  no  Physicians involved in your care Any changes since last visit?  no   Family History  Problem Relation Age of Onset  . Hypertension Father   . Macular degeneration  Father    Social History   Social History  . Marital status: Married    Spouse name: N/A  . Number of children: N/A  . Years of education: N/A   Social History Main Topics  . Smoking status: Never Smoker  . Smokeless tobacco: Never Used  . Alcohol use 3.5 oz/week    7 drink(s) per week     Comment: 6-7 glasses of wine a week  . Drug use: No  . Sexual activity: Yes    Partners: Male    Birth control/ protection: Post-menopausal   Other Topics Concern  . None   Social History Narrative  . None   Past Surgical History:  Procedure Laterality Date  . ANKLE SURGERY    . BREAST SURGERY  8/09   (Rt)Lumpectomy,sentinel node  . EYE SURGERY    . MASTECTOMY, PARTIAL Right 07/31/08   right node Bx (neg)  . RETINAL DETACHMENT SURGERY  june 28th 2012  . TONSILLECTOMY AND ADENOIDECTOMY     Past Medical History:  Diagnosis Date  . Aneurysm of aorta ([facility]) 8/09   4.1cm ascending aortic aneurysm on breast MRI  . Basal cell cancer   . Cancer ([facility]) 07/2008   breast cancer(rt)  . Degenerative disc disease    neck  . Elevated cholesterol   . Fibroid   . Hypertension   . Infertility, female   . Spinal arthritis ([facility])    BP 133/79 (BP Location: Left Arm, Patient Position: Sitting, Cuff Size: Normal)  Pulse 65   Resp 14   SpO2 95%   Opioid Risk Score:   Fall Risk Score:  `1  Depression screen PHQ 2/9  Depression screen [facility] 2/9 03/12/2016 02/13/2016 07/22/2015  Decreased Interest 0 0 0  Down, Depressed, Hopeless 0 0 0  PHQ - 2 Score 0 0 0    Review of Systems  Constitutional: Positive for diaphoresis.  HENT: Negative.   Eyes: Negative.   Respiratory: Negative.   Cardiovascular: Positive for leg swelling.  Gastrointestinal: Negative.   Endocrine: Negative.   Genitourinary: Negative.   Musculoskeletal: Positive for back pain and gait problem.  Allergic/Immunologic: Negative.   Neurological: Positive for weakness and numbness.  Hematological: Negative.     Psychiatric/Behavioral: Negative.        Objective:   Physical Exam  Constitutional: She is oriented to person, place, and time. She appears well-developed and well-nourished.  HENT:  Head: Normocephalic and atraumatic.  Eyes: Conjunctivae and EOM are normal. Pupils are equal, round, and reactive to light.  Neck: Normal range of motion.  Musculoskeletal:       Right hip: Normal.       Left hip: She exhibits bony tenderness. She exhibits normal range of motion and normal strength.  Mild tenderness over left greater trochanter of the hip. Lumbar spine she has no pain over the PSIS area. There is no tenderness over the issue of bursa. No pain over the gluteal musculature.  Neurological: She is alert and oriented to person, place, and time. She displays no atrophy. No sensory deficit. Coordination and gait normal.  Reflex Scores:      Patellar reflexes are 2+ on the right side and 2+ on the left side.      Achilles reflexes are 2+ on the right side and 2+ on the left side. Motor strength is 5/5 bilateral hip flexor, knee extensor, ankle dorsal flexor  Psychiatric: She has a normal mood and affect.  Nursing note and vitals reviewed. Sensory exam reveals decreased sensation left L4 dermatomal distribution to pinprick.        Assessment & Plan:  1. Chronic left buttock pain, ischial bursitis versus sciatica. I favor sciatica / L4 radiculopathy given MRI findings demonstrating L4-L5 stenosis on the left side. In addition, she has sensory loss L4 dermatome distribution. We discussed she's had recent injection would wait for about 6 weeks before repeating an injection and plan on left L4 transforaminal injection.  Discussed this with the patient and her husband Complex situation  pros and cons of various injection options discussed

## 2016-10-21 DIAGNOSIS — H35352 Cystoid macular degeneration, left eye: Secondary | ICD-10-CM | POA: Diagnosis not present

## 2016-10-21 DIAGNOSIS — H35431 Paving stone degeneration of retina, right eye: Secondary | ICD-10-CM | POA: Diagnosis not present

## 2016-10-21 DIAGNOSIS — H33002 Unspecified retinal detachment with retinal break, left eye: Secondary | ICD-10-CM | POA: Diagnosis not present

## 2016-10-21 DIAGNOSIS — H43391 Other vitreous opacities, right eye: Secondary | ICD-10-CM | POA: Diagnosis not present

## 2016-10-21 DIAGNOSIS — H353190 Nonexudative age-related macular degeneration, unspecified eye, stage unspecified: Secondary | ICD-10-CM | POA: Diagnosis not present

## 2016-11-26 ENCOUNTER — Ambulatory Visit (HOSPITAL_BASED_OUTPATIENT_CLINIC_OR_DEPARTMENT_OTHER): Payer: PPO | Admitting: Physical Medicine & Rehabilitation

## 2016-11-26 ENCOUNTER — Encounter: Payer: PPO | Attending: Physical Medicine & Rehabilitation

## 2016-11-26 ENCOUNTER — Encounter: Payer: Self-pay | Admitting: Physical Medicine & Rehabilitation

## 2016-11-26 VITALS — BP 141/95 | HR 57

## 2016-11-26 DIAGNOSIS — M47896 Other spondylosis, lumbar region: Secondary | ICD-10-CM | POA: Diagnosis not present

## 2016-11-26 DIAGNOSIS — M545 Low back pain: Secondary | ICD-10-CM | POA: Diagnosis not present

## 2016-11-26 DIAGNOSIS — M5442 Lumbago with sciatica, left side: Secondary | ICD-10-CM

## 2016-11-26 DIAGNOSIS — M4180 Other forms of scoliosis, site unspecified: Secondary | ICD-10-CM | POA: Insufficient documentation

## 2016-11-26 DIAGNOSIS — M5030 Other cervical disc degeneration, unspecified cervical region: Secondary | ICD-10-CM | POA: Diagnosis not present

## 2016-11-26 DIAGNOSIS — Z85828 Personal history of other malignant neoplasm of skin: Secondary | ICD-10-CM | POA: Insufficient documentation

## 2016-11-26 DIAGNOSIS — E7800 Pure hypercholesterolemia, unspecified: Secondary | ICD-10-CM | POA: Diagnosis not present

## 2016-11-26 DIAGNOSIS — M47899 Other spondylosis, site unspecified: Secondary | ICD-10-CM | POA: Diagnosis not present

## 2016-11-26 DIAGNOSIS — Z853 Personal history of malignant neoplasm of breast: Secondary | ICD-10-CM | POA: Insufficient documentation

## 2016-11-26 DIAGNOSIS — Z8679 Personal history of other diseases of the circulatory system: Secondary | ICD-10-CM | POA: Insufficient documentation

## 2016-11-26 DIAGNOSIS — G8929 Other chronic pain: Secondary | ICD-10-CM | POA: Diagnosis not present

## 2016-11-26 DIAGNOSIS — I10 Essential (primary) hypertension: Secondary | ICD-10-CM | POA: Diagnosis not present

## 2016-11-26 MED ORDER — TRAMADOL HCL 50 MG PO TABS: 50.0000 mg | ORAL_TABLET | Freq: Two times a day (BID) | ORAL | 1 refills | Status: DC

## 2016-11-26 NOTE — Progress Notes (Signed)
Lumbar transforaminal epidural steroid injection under fluoroscopic guidance  Indication: Lumbosacral radiculitis is not relieved by medication management or other conservative care and interfering with self-care and mobility.   Informed consent was obtained after describing risk and benefits of the procedure with the patient, this includes bleeding, bruising, infection, paralysis and medication side effects.  The patient wishes to proceed and has given written consent.  Patient was placed in prone position.  The lumbar area was marked and prepped with Betadine.  It was entered with a 25-gauge 1-1/2 inch needle and one mL of 1% lidocaine was injected into the skin and subcutaneous tissue.  Then a 22-gauge 3.5  spinal needle was inserted into the Left L4-5 intervertebral foramen under AP, lateral, and oblique view.  Then a solution containing one mL of 10 mg per mL dexamethasone and 2 mL of 1% lidocaine was injected.  The patient tolerated procedure well.  Post procedure instructions were given.  Please see post procedure form.

## 2016-11-26 NOTE — Progress Notes (Signed)
  PROCEDURE RECORD [facility]   Name: JANISSA KEPFORD DOB:[birth year] MRN: [id number]  Date:11/26/2016  Physician: Alysia Penna, MD    Nurse/CMA    Shumaker RN/ Sarin Comunale CMA  Allergies:  Allergies  Allergen Reactions  . Penicillins     Nausea & stomach pains    Consent Signed: Yes.    Is patient diabetic? No.  CBG today?   Pregnant: No. LMP: No LMP recorded. Patient is postmenopausal. (age 70-55)  Anticoagulants: no Anti-inflammatory: no Antibiotics: no  Procedure: Lumbar 4 transforaminal epidural steroid injection Position: Prone Start Time: 12:45 End Time: 12:50 Fluoro Time: 30  RN/CMA shumaker RN Ananth Fiallos CMA    Time 1230 1257    BP 141/95 134/80    Pulse 57 62    Respirations 16 16    O2 Sat 96 97    S/S 6 6    Pain Level 5 0     D/C home with husband, [name], D/C instructions reviewed, and sits independently.

## 2016-11-26 NOTE — Patient Instructions (Signed)
Next visit will be for repeat radiofrequency on the left side. This may help you low back pain as well as her buttock pain. Will not help the pain going down the leg into the left calf. I hope to alleviate that pain with the procedure we did today. Take Colace 1-2 tablets with each tramadol dose

## 2016-11-30 ENCOUNTER — Ambulatory Visit: Payer: PPO | Admitting: Physical Medicine & Rehabilitation

## 2016-12-09 DIAGNOSIS — R05 Cough: Secondary | ICD-10-CM | POA: Diagnosis not present

## 2016-12-09 DIAGNOSIS — J029 Acute pharyngitis, unspecified: Secondary | ICD-10-CM | POA: Diagnosis not present

## 2016-12-31 ENCOUNTER — Telehealth: Payer: Self-pay

## 2016-12-31 NOTE — Telephone Encounter (Signed)
Patient called this am asking if it was okay to take her tramadol before 01/01/2017 procedure. The okay has been given to the patient.

## 2017-01-01 ENCOUNTER — Encounter: Payer: PPO | Attending: Physical Medicine & Rehabilitation

## 2017-01-01 ENCOUNTER — Ambulatory Visit (HOSPITAL_BASED_OUTPATIENT_CLINIC_OR_DEPARTMENT_OTHER): Payer: PPO | Admitting: Physical Medicine & Rehabilitation

## 2017-01-01 ENCOUNTER — Encounter: Payer: Self-pay | Admitting: Physical Medicine & Rehabilitation

## 2017-01-01 VITALS — BP 130/83 | HR 59

## 2017-01-01 DIAGNOSIS — E7800 Pure hypercholesterolemia, unspecified: Secondary | ICD-10-CM | POA: Diagnosis not present

## 2017-01-01 DIAGNOSIS — M47896 Other spondylosis, lumbar region: Secondary | ICD-10-CM | POA: Insufficient documentation

## 2017-01-01 DIAGNOSIS — Z85828 Personal history of other malignant neoplasm of skin: Secondary | ICD-10-CM | POA: Diagnosis not present

## 2017-01-01 DIAGNOSIS — M5030 Other cervical disc degeneration, unspecified cervical region: Secondary | ICD-10-CM | POA: Diagnosis not present

## 2017-01-01 DIAGNOSIS — M47899 Other spondylosis, site unspecified: Secondary | ICD-10-CM | POA: Diagnosis not present

## 2017-01-01 DIAGNOSIS — I10 Essential (primary) hypertension: Secondary | ICD-10-CM | POA: Diagnosis not present

## 2017-01-01 DIAGNOSIS — Z8679 Personal history of other diseases of the circulatory system: Secondary | ICD-10-CM | POA: Diagnosis not present

## 2017-01-01 DIAGNOSIS — M545 Low back pain: Secondary | ICD-10-CM | POA: Insufficient documentation

## 2017-01-01 DIAGNOSIS — M47817 Spondylosis without myelopathy or radiculopathy, lumbosacral region: Secondary | ICD-10-CM

## 2017-01-01 DIAGNOSIS — Z853 Personal history of malignant neoplasm of breast: Secondary | ICD-10-CM | POA: Diagnosis not present

## 2017-01-01 DIAGNOSIS — M4180 Other forms of scoliosis, site unspecified: Secondary | ICD-10-CM | POA: Insufficient documentation

## 2017-01-01 MED ORDER — GABAPENTIN 600 MG PO TABS: 600.0000 mg | ORAL_TABLET | Freq: Three times a day (TID) | ORAL | 1 refills | Status: DC

## 2017-01-01 NOTE — Progress Notes (Signed)
Left L5 dorsal ramus., left L4 and left L3 medial branch radio frequency neurotomy under fluoroscopic guidance  Indication: Low back pain due to lumbar spondylosis which has been relieved on 2 occasions by greater than 50% by lumbar medial branch blocks at corresponding levels.  Informed consent was obtained after describing risks and benefits of the procedure with the patient, this includes bleeding, bruising, infection, paralysis and medication side effects. The patient wishes to proceed and has given written consent. The patient was placed in a prone position. The lumbar and sacral area was marked and prepped with Betadine. A 25-gauge 1-1/2 inch needle was inserted into the skin and subcutaneous tissue at 3 sites in one ML of 1% lidocaine was injected into each site. Then a 20-gauge 10 cm radio frequency needle with a 1 cm curved active tip was inserted targeting the left S1 SAP/sacral ala junction. Bone contact was made and confirmed with lateral imaging. Sensory stimulation at 50 Hz followed by motor stimulation at 2 Hz confirm proper needle location followed by injection of one ML of the solution containing one ML of 4 mg per mL dexamethasone and 3 mL of 1% MPF lidocaine. Then the left L5 SAP/transverse process junction was targeted. Bone contact was made and confirmed with lateral imaging. Sensory stimulation at 50 Hz followed by motor stimulation at 2 Hz confirm proper needle location followed by injection of one ML of the solution containing one ML of 4 mg per mL dexamethasone and 3 mL of 1% MPF lidocaine. Then the left L4 SAP/transverse process junction was targeted. Bone contact was made and confirmed with lateral imaging. Sensory stimulation at 50 Hz followed by motor stimulation at 2 Hz confirm proper needle location followed by injection of one ML of the solution containing one ML of 4 mg per mL dexamethasone and 3 mL of 1% MPF lidocaine. Radio frequency lesion being at [facility] for 90 seconds was  performed. Needles were removed. Post procedure instructions and vital signs were performed. Patient tolerated procedure well. Followup appointment was given. Were planning to do L2 medial branch block as well. However, while patient was receiving the lidocaine infiltration into the skin and subcutaneous tissue  stated it was very painful, therefore, decided not to perform the actual RFA at that level. Mild erythema from [facility] May take 25mg  benadryl at home  Right sacroiliac injection one month

## 2017-01-01 NOTE — Patient Instructions (Signed)

## 2017-01-01 NOTE — Progress Notes (Signed)
  PROCEDURE RECORD [facility]   Name: Sandra Tapia DOB:01-24-1946 MRN: [id number]  Date:01/01/2017  Physician: Alysia Penna, MD    Nurse/CMA:Bright CMA  Allergies:  Allergies  Allergen Reactions  . Penicillins     Nausea & stomach pains    Consent Signed: Yes.    Is patient diabetic? No.  CBG today?.  Pregnant: No. LMP: No LMP recorded. Patient is postmenopausal. (age 71-55)  Anticoagulants: no Anti-inflammatory: no Antibiotics: no  Procedure:left L2-5 Radiofrequency Position: Prone Start Time:100pm End Time:133pm Fluoro Time:66s  RN/CMA Bright CMA Bright CMA    Time 1245 140    BP 130/83 136/84    Pulse 59 59    Respirations 16 16    O2 Sat 95 95    S/S 6 6    Pain Level 6 1     D/C home with husband, [name], D/C instructions reviewed, and sits independently.

## 2017-01-21 DIAGNOSIS — M85852 Other specified disorders of bone density and structure, left thigh: Secondary | ICD-10-CM | POA: Diagnosis not present

## 2017-01-21 DIAGNOSIS — M150 Primary generalized (osteo)arthritis: Secondary | ICD-10-CM | POA: Diagnosis not present

## 2017-01-21 DIAGNOSIS — Z7189 Other specified counseling: Secondary | ICD-10-CM | POA: Diagnosis not present

## 2017-01-21 DIAGNOSIS — L659 Nonscarring hair loss, unspecified: Secondary | ICD-10-CM | POA: Diagnosis not present

## 2017-01-21 DIAGNOSIS — Z0000 Encounter for general adult medical examination without abnormal findings: Secondary | ICD-10-CM | POA: Diagnosis not present

## 2017-01-21 DIAGNOSIS — Z791 Long term (current) use of non-steroidal anti-inflammatories (NSAID): Secondary | ICD-10-CM | POA: Diagnosis not present

## 2017-01-21 DIAGNOSIS — Z1211 Encounter for screening for malignant neoplasm of colon: Secondary | ICD-10-CM | POA: Diagnosis not present

## 2017-01-21 DIAGNOSIS — E559 Vitamin D deficiency, unspecified: Secondary | ICD-10-CM | POA: Diagnosis not present

## 2017-01-21 DIAGNOSIS — I10 Essential (primary) hypertension: Secondary | ICD-10-CM | POA: Diagnosis not present

## 2017-01-21 DIAGNOSIS — E782 Mixed hyperlipidemia: Secondary | ICD-10-CM | POA: Diagnosis not present

## 2017-01-21 DIAGNOSIS — M545 Low back pain: Secondary | ICD-10-CM | POA: Diagnosis not present

## 2017-01-21 DIAGNOSIS — L719 Rosacea, unspecified: Secondary | ICD-10-CM | POA: Diagnosis not present

## 2017-01-26 DIAGNOSIS — Z8669 Personal history of other diseases of the nervous system and sense organs: Secondary | ICD-10-CM | POA: Diagnosis not present

## 2017-01-26 DIAGNOSIS — M545 Low back pain: Secondary | ICD-10-CM | POA: Diagnosis not present

## 2017-01-26 DIAGNOSIS — Z853 Personal history of malignant neoplasm of breast: Secondary | ICD-10-CM | POA: Diagnosis not present

## 2017-01-26 DIAGNOSIS — J309 Allergic rhinitis, unspecified: Secondary | ICD-10-CM | POA: Diagnosis not present

## 2017-01-26 DIAGNOSIS — I10 Essential (primary) hypertension: Secondary | ICD-10-CM | POA: Diagnosis not present

## 2017-01-26 DIAGNOSIS — E782 Mixed hyperlipidemia: Secondary | ICD-10-CM | POA: Diagnosis not present

## 2017-01-26 DIAGNOSIS — E559 Vitamin D deficiency, unspecified: Secondary | ICD-10-CM | POA: Diagnosis not present

## 2017-01-26 DIAGNOSIS — M85852 Other specified disorders of bone density and structure, left thigh: Secondary | ICD-10-CM | POA: Diagnosis not present

## 2017-01-26 DIAGNOSIS — M150 Primary generalized (osteo)arthritis: Secondary | ICD-10-CM | POA: Diagnosis not present

## 2017-01-26 DIAGNOSIS — L719 Rosacea, unspecified: Secondary | ICD-10-CM | POA: Diagnosis not present

## 2017-01-29 ENCOUNTER — Encounter: Payer: Self-pay | Admitting: Physical Medicine & Rehabilitation

## 2017-01-29 ENCOUNTER — Ambulatory Visit (HOSPITAL_BASED_OUTPATIENT_CLINIC_OR_DEPARTMENT_OTHER): Payer: PPO | Admitting: Physical Medicine & Rehabilitation

## 2017-01-29 VITALS — BP 130/78 | HR 66 | Resp 14

## 2017-01-29 DIAGNOSIS — M47899 Other spondylosis, site unspecified: Secondary | ICD-10-CM | POA: Insufficient documentation

## 2017-01-29 DIAGNOSIS — I10 Essential (primary) hypertension: Secondary | ICD-10-CM | POA: Insufficient documentation

## 2017-01-29 DIAGNOSIS — M5030 Other cervical disc degeneration, unspecified cervical region: Secondary | ICD-10-CM | POA: Insufficient documentation

## 2017-01-29 DIAGNOSIS — M4180 Other forms of scoliosis, site unspecified: Secondary | ICD-10-CM | POA: Insufficient documentation

## 2017-01-29 DIAGNOSIS — M47896 Other spondylosis, lumbar region: Secondary | ICD-10-CM | POA: Diagnosis not present

## 2017-01-29 DIAGNOSIS — M545 Low back pain: Secondary | ICD-10-CM | POA: Diagnosis not present

## 2017-01-29 DIAGNOSIS — Z8679 Personal history of other diseases of the circulatory system: Secondary | ICD-10-CM | POA: Diagnosis not present

## 2017-01-29 DIAGNOSIS — Z853 Personal history of malignant neoplasm of breast: Secondary | ICD-10-CM | POA: Diagnosis not present

## 2017-01-29 DIAGNOSIS — Z85828 Personal history of other malignant neoplasm of skin: Secondary | ICD-10-CM | POA: Insufficient documentation

## 2017-01-29 DIAGNOSIS — E7800 Pure hypercholesterolemia, unspecified: Secondary | ICD-10-CM | POA: Insufficient documentation

## 2017-01-29 DIAGNOSIS — M533 Sacrococcygeal disorders, not elsewhere classified: Secondary | ICD-10-CM | POA: Diagnosis not present

## 2017-01-29 MED ORDER — TRAMADOL HCL 50 MG PO TABS: 50.0000 mg | ORAL_TABLET | Freq: Two times a day (BID) | ORAL | 1 refills | Status: DC

## 2017-01-29 NOTE — Progress Notes (Signed)
  PROCEDURE RECORD [facility]   Name: PEYTIN EWTON DOB:12-Apr-1946 MRN: [id number]  Date:01/29/2017  Physician: Alysia Penna, MD    Nurse/CMA: Tiyona Desouza, CMA  Allergies:  Allergies  Allergen Reactions  . Penicillins     Nausea & stomach pains    Consent Signed: Yes.    Is patient diabetic? No.  CBG today? n/a  Pregnant: No. LMP: No LMP recorded. Patient is postmenopausal. (age 71-55)  Anticoagulants: no Anti-inflammatory: no Antibiotics: no  Procedure: right sacroiliac steroid injection  Position: Prone Start Time: 1:04pm  End Time: 1:09pm   Fluoro Time: 10  RN/CMA Dany Walther, CMA Kallum Jorgensen, CMA    Time 12:35pm 1:14pm    BP 130/78 151/82    Pulse 66 67    Respirations 14 14    O2 Sat 94 95    S/S 6 6    Pain Level 7/10 2/10     D/C home with husband, [name], D/C instructions reviewed, and sits independently.

## 2017-01-29 NOTE — Patient Instructions (Addendum)
You had a RIght sacroiliac injection today

## 2017-01-29 NOTE — Progress Notes (Signed)

## 2017-02-04 DIAGNOSIS — L820 Inflamed seborrheic keratosis: Secondary | ICD-10-CM | POA: Diagnosis not present

## 2017-02-19 ENCOUNTER — Encounter: Payer: Self-pay | Admitting: Physical Medicine & Rehabilitation

## 2017-02-19 ENCOUNTER — Ambulatory Visit (HOSPITAL_BASED_OUTPATIENT_CLINIC_OR_DEPARTMENT_OTHER): Payer: PPO | Admitting: Physical Medicine & Rehabilitation

## 2017-02-19 ENCOUNTER — Encounter: Payer: PPO | Attending: Physical Medicine & Rehabilitation

## 2017-02-19 VITALS — BP 125/77 | HR 63 | Resp 14

## 2017-02-19 DIAGNOSIS — M545 Low back pain: Secondary | ICD-10-CM | POA: Diagnosis not present

## 2017-02-19 DIAGNOSIS — M47817 Spondylosis without myelopathy or radiculopathy, lumbosacral region: Secondary | ICD-10-CM

## 2017-02-19 NOTE — Patient Instructions (Signed)

## 2017-02-19 NOTE — Progress Notes (Signed)
  PROCEDURE RECORD [facility]   Name: Sandra Tapia DOB:09-16-1946 MRN: [id number]  Date:02/19/2017  Physician: Alysia Penna, MD    Nurse/CMA: Toben Acuna, CMA  Allergies:  Allergies  Allergen Reactions  . Penicillins     Nausea & stomach pains  . Adhesive [Tape] Rash    Gel grounding pads    Consent Signed: Yes.    Is patient diabetic? No.  CBG today?   Pregnant: No. LMP: No LMP recorded. Patient is postmenopausal. (age 71-55)  Anticoagulants: no Anti-inflammatory: no Antibiotics: no  Procedure: right L3,4,5 rafdiofrequency neurotomy  Position: Prone Start Time: 3:55pm  End Time: 4:16 pm  Fluoro Time: 37  RN/CMA Jonanthony Nahar, CMA Lenia Housley, CMA    Time 3:20 pm 4:21pm    BP 125/77 136/84    Pulse 63 65    Respirations 14 14    O2 Sat 94 96    S/S 6 6    Pain Level 5/10 1/10     D/C home with husband, [name], D/C instructions reviewed, and sits independently.

## 2017-02-19 NOTE — Progress Notes (Signed)
RightL5 dorsal ramus., Right L4 and Right L3 medial branch radio frequency neurotomy under fluoroscopic guidance   Indication: Low back pain due to lumbar spondylosis which has been relieved on 2 occasions by greater than 50% by lumbar medial branch blocks at corresponding levels.  Informed consent was obtained after describing risks and benefits of the procedure with the patient, this includes bleeding, bruising, infection, paralysis and medication side effects. The patient wishes to proceed and has given written consent. The patient was placed in a prone position. The lumbar and sacral area was marked and prepped with Betadine. A 25-gauge 1-1/2 inch needle was inserted into the skin and subcutaneous tissue at 3 sites in one ML of 1% lidocaine was injected into each site. Then a 18-gauge 10 cm radio frequency needle with a 1 cm curved active tip was inserted targeting the Right S1 SAP/sacral ala junction. Bone contact was made and confirmed with lateral imaging.  motor stimulation at 2 Hz confirm proper needle location followed by injection of 1ml 2% MPF lidocaine. Then the Right L5 SAP/transverse process junction was targeted. Bone contact was made and confirmed with lateral imaging.  motor stimulation at 2 Hz confirm proper needle location followed by injection of 1ml 2% MPF lidocaine. Then the Right L4 SAP/transverse process junction was targeted. Bone contact was made and confirmed with lateral imaging. motor stimulation at 2 Hz confirm proper needle location followed by injection of 1ml 2% MPF lidocaine. Radio frequency lesion being at 80C for 90 seconds was performed. Needles were removed. Post procedure instructions and vital signs were performed. Patient tolerated procedure well. Followup appointment was given.  Because of her scoliosis each level needed to have separated fluoroscopic views 

## 2017-02-23 ENCOUNTER — Telehealth: Payer: Self-pay | Admitting: *Deleted

## 2017-02-23 NOTE — Telephone Encounter (Signed)
Dena has a question about the procedure done 11/26/17 billing related to the RFs done.  Transferred to April M to discuss.

## 2017-03-18 ENCOUNTER — Encounter: Payer: PPO | Attending: Physical Medicine & Rehabilitation

## 2017-03-18 ENCOUNTER — Ambulatory Visit (HOSPITAL_BASED_OUTPATIENT_CLINIC_OR_DEPARTMENT_OTHER): Payer: PPO | Admitting: Physical Medicine & Rehabilitation

## 2017-03-18 ENCOUNTER — Encounter: Payer: Self-pay | Admitting: Physical Medicine & Rehabilitation

## 2017-03-18 VITALS — BP 163/86 | HR 56 | Resp 14

## 2017-03-18 DIAGNOSIS — Z85828 Personal history of other malignant neoplasm of skin: Secondary | ICD-10-CM | POA: Diagnosis not present

## 2017-03-18 DIAGNOSIS — M5030 Other cervical disc degeneration, unspecified cervical region: Secondary | ICD-10-CM | POA: Insufficient documentation

## 2017-03-18 DIAGNOSIS — M47899 Other spondylosis, site unspecified: Secondary | ICD-10-CM | POA: Diagnosis not present

## 2017-03-18 DIAGNOSIS — E7800 Pure hypercholesterolemia, unspecified: Secondary | ICD-10-CM | POA: Diagnosis not present

## 2017-03-18 DIAGNOSIS — Z8679 Personal history of other diseases of the circulatory system: Secondary | ICD-10-CM | POA: Insufficient documentation

## 2017-03-18 DIAGNOSIS — M47896 Other spondylosis, lumbar region: Secondary | ICD-10-CM | POA: Insufficient documentation

## 2017-03-18 DIAGNOSIS — M533 Sacrococcygeal disorders, not elsewhere classified: Secondary | ICD-10-CM

## 2017-03-18 DIAGNOSIS — Z853 Personal history of malignant neoplasm of breast: Secondary | ICD-10-CM | POA: Insufficient documentation

## 2017-03-18 DIAGNOSIS — M545 Low back pain: Secondary | ICD-10-CM | POA: Diagnosis not present

## 2017-03-18 DIAGNOSIS — I10 Essential (primary) hypertension: Secondary | ICD-10-CM | POA: Diagnosis not present

## 2017-03-18 DIAGNOSIS — M419 Scoliosis, unspecified: Secondary | ICD-10-CM | POA: Diagnosis not present

## 2017-03-18 DIAGNOSIS — M4180 Other forms of scoliosis, site unspecified: Secondary | ICD-10-CM | POA: Diagnosis not present

## 2017-03-18 DIAGNOSIS — M47817 Spondylosis without myelopathy or radiculopathy, lumbosacral region: Secondary | ICD-10-CM

## 2017-03-18 MED ORDER — TRAMADOL HCL 50 MG PO TABS: 50.0000 mg | ORAL_TABLET | Freq: Two times a day (BID) | ORAL | 1 refills | Status: DC

## 2017-03-18 NOTE — Progress Notes (Signed)
Subjective:    Patient ID: Sandra Tapia, female    DOB: May 21, 1946, 71 y.o.   MRN: [id number]  HPI Reviewed injections 01/01/17  Left L3-4-5 RFA 02/19/17 Right L3-4-5 RFA 2/2 RIght Sacroiliac injection fluoro guided  Left L4-5 ESI November no LLE radicular pain  Takes Tramadol 50mg  once in am and occ in pm, As needed  Planning trip to [place] in June. Asking about any further procedures, prior to that time   Pain Inventory Average Pain 6 Pain Right Now 6 My pain is sharp, dull, stabbing, tingling and aching  In the last 24 hours, has pain interfered with the following? General activity 7 Relation with others 7 Enjoyment of life 7 What TIME of day is your pain at its worst? daytime Sleep (in general) Good  Pain is worse with: walking, sitting, standing and some activites Pain improves with: rest, heat/ice, therapy/exercise, pacing activities, medication and injections Relief from Meds: no selection  Mobility walk without assistance how many minutes can you walk? 20 ability to climb steps?  yes do you drive?  yes transfers alone Do you have any goals in this area?  yes  Function retired I need assistance with the following:  meal prep, household duties and shopping  Neuro/Psych weakness numbness trouble walking  Prior Studies Any changes since last visit?  no  Physicians involved in your care Any changes since last visit?  no   Family History  Problem Relation Age of Onset  . Hypertension Father   . Macular degeneration Father    Social History   Social History  . Marital status: Married    Spouse name: N/A  . Number of children: N/A  . Years of education: N/A   Social History Main Topics  . Smoking status: Never Smoker  . Smokeless tobacco: Never Used  . Alcohol use 3.5 oz/week    7 drink(s) per week     Comment: 6-7 glasses of wine a week  . Drug use: No  . Sexual activity: Yes    Partners: Male    Birth control/ protection:  Post-menopausal   Other Topics Concern  . None   Social History Narrative  . None   Past Surgical History:  Procedure Laterality Date  . ANKLE SURGERY    . BREAST SURGERY  8/09   (Rt)Lumpectomy,sentinel node  . EYE SURGERY    . MASTECTOMY, PARTIAL Right 07/31/08   right node Bx (neg)  . RETINAL DETACHMENT SURGERY  june 28th 2012  . TONSILLECTOMY AND ADENOIDECTOMY     Past Medical History:  Diagnosis Date  . Aneurysm of aorta ([place]) 8/09   4.1cm ascending aortic aneurysm on breast MRI  . Basal cell cancer   . Cancer ([place]) 07/2008   breast cancer(rt)  . Degenerative disc disease    neck  . Elevated cholesterol   . Fibroid   . Hypertension   . Infertility, female   . Spinal arthritis ([facility])    BP (!) 163/86   Pulse (!) 56   Resp 14   SpO2 96%   Opioid Risk Score:   Fall Risk Score:  `1  Depression screen PHQ 2/9  Depression screen [facility] 2/9 03/12/2016 02/13/2016 07/22/2015  Decreased Interest 0 0 0  Down, Depressed, Hopeless 0 0 0  PHQ - 2 Score 0 0 0    Review of Systems  Constitutional: Positive for diaphoresis.  HENT: Negative.   Eyes: Negative.   Respiratory: Negative.   Cardiovascular: Positive for leg  swelling.  Gastrointestinal: Negative.   Endocrine: Negative.   Genitourinary: Negative.   Musculoskeletal: Positive for back pain and gait problem.  Allergic/Immunologic: Negative.   Neurological: Positive for weakness and numbness.  Hematological: Negative.   Psychiatric/Behavioral: Negative.        Objective:   Physical Exam  Constitutional: She is oriented to person, place, and time. She appears well-developed and well-nourished.  HENT:  Head: Normocephalic and atraumatic.  Eyes: Conjunctivae and EOM are normal. Pupils are equal, round, and reactive to light.  Neck: Normal range of motion.  Neurological: She is alert and oriented to person, place, and time.  Psychiatric: She has a normal mood and affect. Her behavior is normal. Judgment and thought  content normal.  Nursing note and vitals reviewed.  5/5 strength in bilateral deltoid, biceps, triceps, grip, hip flexors and extensors, ankle obstruction.  Tenderness to palpation bilateral PSIS area. Faber's causing pain in PSIS area and not growing. Negative straight leg raising. Gait without evidence of toe drag or knee instability       Assessment & Plan:  1. Lumbar scoliosis as well as lumbar spondylosis without myelopathy. She's had good results with radiofrequency neurotomy. Bilateral L3, L4 medial branch and L5 dorsal ramus. Do not ask anticipate repeating for approximate 6 months.  2. History of left L4-5 lumbar radiculopathy, currently asymptomatic. If patient gets symptomatic. She should contact us and schedule for repeat left L4-5 transverse foraminal lumbar epidural steroid injection under fluoroscopic guidance.  3. Sacroiliac disorder. She's had good results with right-sided injection, but starting to wear off. Left side is also symptomatic. Plan on repeating sacroiliac injection on the right side as well as performing on the left side as well. In early June. Patient will schedule  Continue tramadol 50 mg twice a day when necessary  Discussed with patient and her husband. Coordinating with patient's summer  schedule. Over half of the 25 min visit was spent counseling and coordinating care.

## 2017-04-03 DIAGNOSIS — H1032 Unspecified acute conjunctivitis, left eye: Secondary | ICD-10-CM | POA: Diagnosis not present

## 2017-04-21 DIAGNOSIS — H35352 Cystoid macular degeneration, left eye: Secondary | ICD-10-CM | POA: Diagnosis not present

## 2017-04-21 DIAGNOSIS — H33002 Unspecified retinal detachment with retinal break, left eye: Secondary | ICD-10-CM | POA: Diagnosis not present

## 2017-04-21 DIAGNOSIS — H353132 Nonexudative age-related macular degeneration, bilateral, intermediate dry stage: Secondary | ICD-10-CM | POA: Diagnosis not present

## 2017-04-21 DIAGNOSIS — H35431 Paving stone degeneration of retina, right eye: Secondary | ICD-10-CM | POA: Diagnosis not present

## 2017-04-21 DIAGNOSIS — H40052 Ocular hypertension, left eye: Secondary | ICD-10-CM | POA: Diagnosis not present

## 2017-04-21 DIAGNOSIS — H43391 Other vitreous opacities, right eye: Secondary | ICD-10-CM | POA: Diagnosis not present

## 2017-04-28 DIAGNOSIS — L820 Inflamed seborrheic keratosis: Secondary | ICD-10-CM | POA: Diagnosis not present

## 2017-05-20 DIAGNOSIS — L820 Inflamed seborrheic keratosis: Secondary | ICD-10-CM | POA: Diagnosis not present

## 2017-05-28 DIAGNOSIS — H40052 Ocular hypertension, left eye: Secondary | ICD-10-CM | POA: Diagnosis not present

## 2017-06-02 ENCOUNTER — Telehealth: Payer: Self-pay

## 2017-06-02 NOTE — Telephone Encounter (Signed)
Patient called wanting to know if there are any special instructions prior to her injections tomorrow, called patient back and informed her of pre-procedure instructions.

## 2017-06-03 ENCOUNTER — Ambulatory Visit (HOSPITAL_BASED_OUTPATIENT_CLINIC_OR_DEPARTMENT_OTHER): Payer: PPO | Admitting: Physical Medicine & Rehabilitation

## 2017-06-03 ENCOUNTER — Encounter: Payer: Self-pay | Admitting: Physical Medicine & Rehabilitation

## 2017-06-03 ENCOUNTER — Encounter: Payer: PPO | Attending: Physical Medicine & Rehabilitation

## 2017-06-03 VITALS — BP 153/83 | HR 57

## 2017-06-03 DIAGNOSIS — Z853 Personal history of malignant neoplasm of breast: Secondary | ICD-10-CM | POA: Insufficient documentation

## 2017-06-03 DIAGNOSIS — M545 Low back pain: Secondary | ICD-10-CM | POA: Insufficient documentation

## 2017-06-03 DIAGNOSIS — Z85828 Personal history of other malignant neoplasm of skin: Secondary | ICD-10-CM | POA: Diagnosis not present

## 2017-06-03 DIAGNOSIS — Z8679 Personal history of other diseases of the circulatory system: Secondary | ICD-10-CM | POA: Diagnosis not present

## 2017-06-03 DIAGNOSIS — M533 Sacrococcygeal disorders, not elsewhere classified: Secondary | ICD-10-CM | POA: Diagnosis not present

## 2017-06-03 DIAGNOSIS — M5030 Other cervical disc degeneration, unspecified cervical region: Secondary | ICD-10-CM | POA: Insufficient documentation

## 2017-06-03 DIAGNOSIS — M4180 Other forms of scoliosis, site unspecified: Secondary | ICD-10-CM | POA: Insufficient documentation

## 2017-06-03 DIAGNOSIS — E7800 Pure hypercholesterolemia, unspecified: Secondary | ICD-10-CM | POA: Insufficient documentation

## 2017-06-03 DIAGNOSIS — M47899 Other spondylosis, site unspecified: Secondary | ICD-10-CM | POA: Insufficient documentation

## 2017-06-03 DIAGNOSIS — M47896 Other spondylosis, lumbar region: Secondary | ICD-10-CM | POA: Diagnosis not present

## 2017-06-03 DIAGNOSIS — I10 Essential (primary) hypertension: Secondary | ICD-10-CM | POA: Insufficient documentation

## 2017-06-03 MED ORDER — DICLOFENAC SODIUM 25 MG PO TBEC: 75.0000 mg | DELAYED_RELEASE_TABLET | Freq: Two times a day (BID) | ORAL | Status: DC

## 2017-06-03 MED ORDER — DICLOFENAC SODIUM 75 MG PO TBEC: 75.0000 mg | DELAYED_RELEASE_TABLET | Freq: Two times a day (BID) | ORAL | 2 refills | Status: DC

## 2017-06-03 MED ORDER — TRAMADOL HCL 50 MG PO TABS: 50.0000 mg | ORAL_TABLET | Freq: Two times a day (BID) | ORAL | 1 refills | Status: DC

## 2017-06-03 NOTE — Patient Instructions (Signed)
Sacroiliac injection was performed today. A combination of a naming medicine plus a cortisone medicine was injected. The injection was done under x-ray guidance. This procedure has been performed to help reduce low back and buttocks pain as well as potentially hip pain. The duration of this injection is variable lasting from hours to  Months. It may repeated if needed. 

## 2017-06-03 NOTE — Progress Notes (Signed)

## 2017-06-03 NOTE — Progress Notes (Signed)
  PROCEDURE RECORD [facility]   Name: Sandra Tapia DOB:[birth year] MRN: [id number]  Date:06/03/2017  Physician: Alysia Penna, MD    Nurse/CMA: Bright CMA  Allergies:  Allergies  Allergen Reactions  . Penicillins     Nausea & stomach pains  . Adhesive [Tape] Rash    Gel grounding pads    Consent Signed: Yes.    Is patient diabetic? No.  CBG today? NA  Pregnant: No. LMP: No LMP recorded. Patient is postmenopausal. (age 71-55)  Anticoagulants: no Anti-inflammatory: no Antibiotics: no  Procedure: Bilateral Sacroiliac    position: Prone   Start Time: 1035am End Time: 1045am Fluoro Time: 36s  RN/CMA Bright CMA Bright CMA    Time 1020am 1045    BP 153/83 143/83    Pulse 57 58    Respirations 16 16    O2 Sat 96 97    S/S 6 6    Pain Level 4/10 0/10     D/C home with husband [name], [name], D/C instructions reviewed, and sits independently.

## 2017-07-23 DIAGNOSIS — Z1211 Encounter for screening for malignant neoplasm of colon: Secondary | ICD-10-CM | POA: Diagnosis not present

## 2017-08-05 DIAGNOSIS — M85852 Other specified disorders of bone density and structure, left thigh: Secondary | ICD-10-CM | POA: Diagnosis not present

## 2017-08-05 DIAGNOSIS — L659 Nonscarring hair loss, unspecified: Secondary | ICD-10-CM | POA: Diagnosis not present

## 2017-08-05 DIAGNOSIS — Z0000 Encounter for general adult medical examination without abnormal findings: Secondary | ICD-10-CM | POA: Diagnosis not present

## 2017-08-05 DIAGNOSIS — E782 Mixed hyperlipidemia: Secondary | ICD-10-CM | POA: Diagnosis not present

## 2017-08-05 DIAGNOSIS — Z791 Long term (current) use of non-steroidal anti-inflammatories (NSAID): Secondary | ICD-10-CM | POA: Diagnosis not present

## 2017-08-05 DIAGNOSIS — L719 Rosacea, unspecified: Secondary | ICD-10-CM | POA: Diagnosis not present

## 2017-08-05 DIAGNOSIS — I10 Essential (primary) hypertension: Secondary | ICD-10-CM | POA: Diagnosis not present

## 2017-08-05 DIAGNOSIS — M150 Primary generalized (osteo)arthritis: Secondary | ICD-10-CM | POA: Diagnosis not present

## 2017-08-05 DIAGNOSIS — I712 Thoracic aortic aneurysm, without rupture: Secondary | ICD-10-CM | POA: Diagnosis not present

## 2017-08-05 DIAGNOSIS — Z7189 Other specified counseling: Secondary | ICD-10-CM | POA: Diagnosis not present

## 2017-08-05 DIAGNOSIS — M545 Low back pain: Secondary | ICD-10-CM | POA: Diagnosis not present

## 2017-08-05 DIAGNOSIS — E559 Vitamin D deficiency, unspecified: Secondary | ICD-10-CM | POA: Diagnosis not present

## 2017-08-12 DIAGNOSIS — M545 Low back pain: Secondary | ICD-10-CM | POA: Diagnosis not present

## 2017-08-12 DIAGNOSIS — I77810 Thoracic aortic ectasia: Secondary | ICD-10-CM | POA: Diagnosis not present

## 2017-08-12 DIAGNOSIS — Z124 Encounter for screening for malignant neoplasm of cervix: Secondary | ICD-10-CM | POA: Diagnosis not present

## 2017-08-12 DIAGNOSIS — M150 Primary generalized (osteo)arthritis: Secondary | ICD-10-CM | POA: Diagnosis not present

## 2017-08-12 DIAGNOSIS — M85852 Other specified disorders of bone density and structure, left thigh: Secondary | ICD-10-CM | POA: Diagnosis not present

## 2017-08-12 DIAGNOSIS — I10 Essential (primary) hypertension: Secondary | ICD-10-CM | POA: Diagnosis not present

## 2017-08-12 DIAGNOSIS — L719 Rosacea, unspecified: Secondary | ICD-10-CM | POA: Diagnosis not present

## 2017-08-12 DIAGNOSIS — E782 Mixed hyperlipidemia: Secondary | ICD-10-CM | POA: Diagnosis not present

## 2017-08-12 DIAGNOSIS — E559 Vitamin D deficiency, unspecified: Secondary | ICD-10-CM | POA: Diagnosis not present

## 2017-08-12 DIAGNOSIS — Z1389 Encounter for screening for other disorder: Secondary | ICD-10-CM | POA: Diagnosis not present

## 2017-08-12 DIAGNOSIS — Z0000 Encounter for general adult medical examination without abnormal findings: Secondary | ICD-10-CM | POA: Diagnosis not present

## 2017-08-12 DIAGNOSIS — Z791 Long term (current) use of non-steroidal anti-inflammatories (NSAID): Secondary | ICD-10-CM | POA: Diagnosis not present

## 2017-08-19 DIAGNOSIS — C44319 Basal cell carcinoma of skin of other parts of face: Secondary | ICD-10-CM | POA: Diagnosis not present

## 2017-08-19 DIAGNOSIS — D225 Melanocytic nevi of trunk: Secondary | ICD-10-CM | POA: Diagnosis not present

## 2017-08-19 DIAGNOSIS — L820 Inflamed seborrheic keratosis: Secondary | ICD-10-CM | POA: Diagnosis not present

## 2017-09-01 ENCOUNTER — Telehealth: Payer: Self-pay | Admitting: *Deleted

## 2017-09-01 NOTE — Telephone Encounter (Signed)
FYI Scheduled for annual on 10-07-2017 for 2:00 pm lab & 2:30 pm MD F/U has been cancelled by this nurse at this time.  "Not sure how I was scheduled for a [facility] appointment on 10-07-2017.  What is this appointment for?  Who will I see?  Dr. Jana Hakim released me a few years ago.  I saw my PCP Dr. Addison Lank last month.  My annual mammogram is scheduled on tomorrow."  9- 21-2016 last visit with Dr. Jana Hakim reads released to PCP and survivorship. 09-20-2015 letter sent to PCP stating no further scheduled F/U with Dr. Jana Hakim.   10-08-2016 Survivorship visit reads "Graduated" with LOS to F/U with Dr. Jana Hakim in one year was scheduled after this visit.  Denies needs at this time.  This nurse apologized for any confusion with scheduling.  Offered [facility] help with any needs, just call at any time in the near or far future.

## 2017-09-02 DIAGNOSIS — M8589 Other specified disorders of bone density and structure, multiple sites: Secondary | ICD-10-CM | POA: Diagnosis not present

## 2017-09-02 DIAGNOSIS — Z853 Personal history of malignant neoplasm of breast: Secondary | ICD-10-CM | POA: Diagnosis not present

## 2017-09-02 DIAGNOSIS — R928 Other abnormal and inconclusive findings on diagnostic imaging of breast: Secondary | ICD-10-CM | POA: Diagnosis not present

## 2017-09-21 ENCOUNTER — Telehealth: Payer: Self-pay | Admitting: *Deleted

## 2017-09-21 MED ORDER — TRAMADOL HCL 50 MG PO TABS: 50.0000 mg | ORAL_TABLET | Freq: Two times a day (BID) | ORAL | 2 refills | Status: DC

## 2017-09-21 MED ORDER — DICLOFENAC SODIUM 75 MG PO TBEC: 75.0000 mg | DELAYED_RELEASE_TABLET | Freq: Two times a day (BID) | ORAL | 2 refills | Status: DC

## 2017-09-21 NOTE — Telephone Encounter (Signed)
Sandra Tapia is requesting refills on her diclofenac and her tramadol. Medications sent to pharmacy. Sandra Tapia notified.

## 2017-10-07 ENCOUNTER — Other Ambulatory Visit: Payer: PPO

## 2017-10-07 ENCOUNTER — Ambulatory Visit: Payer: PPO | Admitting: Oncology

## 2017-10-14 ENCOUNTER — Encounter: Payer: Self-pay | Admitting: Oncology

## 2017-10-15 DIAGNOSIS — H35352 Cystoid macular degeneration, left eye: Secondary | ICD-10-CM | POA: Diagnosis not present

## 2017-10-15 DIAGNOSIS — H20022 Recurrent acute iridocyclitis, left eye: Secondary | ICD-10-CM | POA: Diagnosis not present

## 2017-10-15 DIAGNOSIS — H33002 Unspecified retinal detachment with retinal break, left eye: Secondary | ICD-10-CM | POA: Diagnosis not present

## 2017-10-15 DIAGNOSIS — H209 Unspecified iridocyclitis: Secondary | ICD-10-CM | POA: Diagnosis not present

## 2017-10-15 DIAGNOSIS — H353132 Nonexudative age-related macular degeneration, bilateral, intermediate dry stage: Secondary | ICD-10-CM | POA: Diagnosis not present

## 2017-10-15 DIAGNOSIS — H40053 Ocular hypertension, bilateral: Secondary | ICD-10-CM | POA: Diagnosis not present

## 2017-10-15 DIAGNOSIS — H40052 Ocular hypertension, left eye: Secondary | ICD-10-CM | POA: Diagnosis not present

## 2017-10-15 DIAGNOSIS — H35433 Paving stone degeneration of retina, bilateral: Secondary | ICD-10-CM | POA: Diagnosis not present

## 2017-11-02 DIAGNOSIS — L820 Inflamed seborrheic keratosis: Secondary | ICD-10-CM | POA: Diagnosis not present

## 2017-11-12 DIAGNOSIS — H4032X2 Glaucoma secondary to eye trauma, left eye, moderate stage: Secondary | ICD-10-CM | POA: Diagnosis not present

## 2017-12-23 DIAGNOSIS — R300 Dysuria: Secondary | ICD-10-CM | POA: Diagnosis not present

## 2017-12-23 DIAGNOSIS — R05 Cough: Secondary | ICD-10-CM | POA: Diagnosis not present

## 2018-01-06 DIAGNOSIS — J069 Acute upper respiratory infection, unspecified: Secondary | ICD-10-CM | POA: Diagnosis not present

## 2018-01-18 ENCOUNTER — Other Ambulatory Visit: Payer: Self-pay | Admitting: Physical Medicine & Rehabilitation

## 2018-01-19 ENCOUNTER — Other Ambulatory Visit: Payer: Self-pay | Admitting: Physical Medicine & Rehabilitation

## 2018-01-20 ENCOUNTER — Other Ambulatory Visit: Payer: Self-pay | Admitting: Physical Medicine & Rehabilitation

## 2018-01-21 DIAGNOSIS — I10 Essential (primary) hypertension: Secondary | ICD-10-CM | POA: Diagnosis not present

## 2018-01-21 DIAGNOSIS — Z0000 Encounter for general adult medical examination without abnormal findings: Secondary | ICD-10-CM | POA: Diagnosis not present

## 2018-01-21 DIAGNOSIS — L719 Rosacea, unspecified: Secondary | ICD-10-CM | POA: Diagnosis not present

## 2018-01-21 DIAGNOSIS — E782 Mixed hyperlipidemia: Secondary | ICD-10-CM | POA: Diagnosis not present

## 2018-01-21 DIAGNOSIS — M85852 Other specified disorders of bone density and structure, left thigh: Secondary | ICD-10-CM | POA: Diagnosis not present

## 2018-01-21 DIAGNOSIS — Z8669 Personal history of other diseases of the nervous system and sense organs: Secondary | ICD-10-CM | POA: Diagnosis not present

## 2018-01-21 DIAGNOSIS — Z791 Long term (current) use of non-steroidal anti-inflammatories (NSAID): Secondary | ICD-10-CM | POA: Diagnosis not present

## 2018-01-21 DIAGNOSIS — E559 Vitamin D deficiency, unspecified: Secondary | ICD-10-CM | POA: Diagnosis not present

## 2018-01-21 DIAGNOSIS — I77810 Thoracic aortic ectasia: Secondary | ICD-10-CM | POA: Diagnosis not present

## 2018-01-21 DIAGNOSIS — M545 Low back pain: Secondary | ICD-10-CM | POA: Diagnosis not present

## 2018-01-21 DIAGNOSIS — M150 Primary generalized (osteo)arthritis: Secondary | ICD-10-CM | POA: Diagnosis not present

## 2018-01-21 DIAGNOSIS — Z1389 Encounter for screening for other disorder: Secondary | ICD-10-CM | POA: Diagnosis not present

## 2018-01-27 DIAGNOSIS — Z791 Long term (current) use of non-steroidal anti-inflammatories (NSAID): Secondary | ICD-10-CM | POA: Diagnosis not present

## 2018-01-27 DIAGNOSIS — I10 Essential (primary) hypertension: Secondary | ICD-10-CM | POA: Diagnosis not present

## 2018-01-27 DIAGNOSIS — L719 Rosacea, unspecified: Secondary | ICD-10-CM | POA: Diagnosis not present

## 2018-01-27 DIAGNOSIS — M150 Primary generalized (osteo)arthritis: Secondary | ICD-10-CM | POA: Diagnosis not present

## 2018-01-27 DIAGNOSIS — E559 Vitamin D deficiency, unspecified: Secondary | ICD-10-CM | POA: Diagnosis not present

## 2018-01-27 DIAGNOSIS — M545 Low back pain: Secondary | ICD-10-CM | POA: Diagnosis not present

## 2018-01-27 DIAGNOSIS — E782 Mixed hyperlipidemia: Secondary | ICD-10-CM | POA: Diagnosis not present

## 2018-01-27 DIAGNOSIS — I77810 Thoracic aortic ectasia: Secondary | ICD-10-CM | POA: Diagnosis not present

## 2018-01-27 DIAGNOSIS — R05 Cough: Secondary | ICD-10-CM | POA: Diagnosis not present

## 2018-01-27 DIAGNOSIS — Z853 Personal history of malignant neoplasm of breast: Secondary | ICD-10-CM | POA: Diagnosis not present

## 2018-01-27 DIAGNOSIS — H409 Unspecified glaucoma: Secondary | ICD-10-CM | POA: Diagnosis not present

## 2018-02-04 DIAGNOSIS — H40052 Ocular hypertension, left eye: Secondary | ICD-10-CM | POA: Diagnosis not present

## 2018-02-04 DIAGNOSIS — H33002 Unspecified retinal detachment with retinal break, left eye: Secondary | ICD-10-CM | POA: Diagnosis not present

## 2018-02-04 DIAGNOSIS — H35352 Cystoid macular degeneration, left eye: Secondary | ICD-10-CM | POA: Diagnosis not present

## 2018-02-04 DIAGNOSIS — H209 Unspecified iridocyclitis: Secondary | ICD-10-CM | POA: Diagnosis not present

## 2018-02-04 DIAGNOSIS — H35433 Paving stone degeneration of retina, bilateral: Secondary | ICD-10-CM | POA: Diagnosis not present

## 2018-02-04 DIAGNOSIS — H43391 Other vitreous opacities, right eye: Secondary | ICD-10-CM | POA: Diagnosis not present

## 2018-02-10 ENCOUNTER — Other Ambulatory Visit: Payer: Self-pay

## 2018-02-10 ENCOUNTER — Ambulatory Visit: Payer: PPO | Admitting: Physical Medicine & Rehabilitation

## 2018-02-10 ENCOUNTER — Encounter: Payer: PPO | Attending: Physical Medicine & Rehabilitation

## 2018-02-10 ENCOUNTER — Encounter: Payer: Self-pay | Admitting: Physical Medicine & Rehabilitation

## 2018-02-10 VITALS — BP 116/78 | HR 56

## 2018-02-10 DIAGNOSIS — M533 Sacrococcygeal disorders, not elsewhere classified: Secondary | ICD-10-CM

## 2018-02-10 DIAGNOSIS — M545 Low back pain: Secondary | ICD-10-CM | POA: Diagnosis not present

## 2018-02-10 MED ORDER — TRAMADOL HCL 50 MG PO TABS: 50.0000 mg | ORAL_TABLET | Freq: Two times a day (BID) | ORAL | 5 refills | Status: DC

## 2018-02-10 MED ORDER — DICLOFENAC SODIUM 75 MG PO TBEC: 75.0000 mg | DELAYED_RELEASE_TABLET | Freq: Two times a day (BID) | ORAL | 2 refills | Status: DC

## 2018-02-10 NOTE — Progress Notes (Signed)
  PROCEDURE RECORD [facility]   Name: Sandra Tapia DOB:1946-05-26 MRN: [id number]  Date:02/10/2018  Physician: Alysia Penna, MD    Nurse/CMA: Noal Abshier CMA  Allergies:  Allergies  Allergen Reactions  . Penicillins     Nausea & stomach pains  . Adhesive [Tape] Rash    Gel grounding pads    Consent Signed: Yes.    Is patient diabetic? No.  CBG today? NA  Pregnant: No. LMP: No LMP recorded. Patient is postmenopausal. (age 79-55)  Anticoagulants: no Anti-inflammatory: yes (diclofenac tab this AM) Antibiotics: no  Procedure: Bilateral Sacroiliac injection Position: Prone   Start Time: 100pm  End Time: 115pm Fluoro Time: 32s  RN/CMA Glenis Musolf CMA Miraj Truss CMA    Time 1245pm 115pm    BP 116/78 124/79    Pulse 56 57    Respirations 16 16    O2 Sat 97 95    S/S 6 6    Pain Level 6/10 2/10     D/C home with husband, [name], D/C instructions reviewed, and sits independently.

## 2018-02-10 NOTE — Progress Notes (Signed)

## 2018-02-10 NOTE — Patient Instructions (Addendum)
If this injection is not helpful or your pain moves higher in your back , please call to schedule a repeat l3, L4 medial branch and L5 dorsal ramus radiofrequency  I would repeat the left side first unless you have more right sided symptoms  Sacroiliac injection was performed today. A combination of a naming medicine plus a cortisone medicine was injected. The injection was done under x-ray guidance. This procedure has been performed to help reduce low back and buttocks pain as well as potentially hip pain. The duration of this injection is variable lasting from hours to  Months. It may repeated if needed.

## 2018-02-17 DIAGNOSIS — L820 Inflamed seborrheic keratosis: Secondary | ICD-10-CM | POA: Diagnosis not present

## 2018-03-21 DIAGNOSIS — R05 Cough: Secondary | ICD-10-CM | POA: Diagnosis not present

## 2018-03-21 DIAGNOSIS — J301 Allergic rhinitis due to pollen: Secondary | ICD-10-CM | POA: Diagnosis not present

## 2018-04-07 DIAGNOSIS — L820 Inflamed seborrheic keratosis: Secondary | ICD-10-CM | POA: Diagnosis not present

## 2018-05-02 ENCOUNTER — Encounter: Payer: PPO | Attending: Physical Medicine & Rehabilitation

## 2018-05-02 ENCOUNTER — Other Ambulatory Visit: Payer: Self-pay

## 2018-05-02 ENCOUNTER — Ambulatory Visit (HOSPITAL_BASED_OUTPATIENT_CLINIC_OR_DEPARTMENT_OTHER): Payer: PPO | Admitting: Physical Medicine & Rehabilitation

## 2018-05-02 DIAGNOSIS — M545 Low back pain: Secondary | ICD-10-CM | POA: Diagnosis not present

## 2018-05-02 DIAGNOSIS — M47817 Spondylosis without myelopathy or radiculopathy, lumbosacral region: Secondary | ICD-10-CM

## 2018-05-02 DIAGNOSIS — M533 Sacrococcygeal disorders, not elsewhere classified: Secondary | ICD-10-CM | POA: Diagnosis not present

## 2018-05-02 NOTE — Procedures (Signed)
Left L5 dorsal ramus., left L4 and left L3 medial branch radio frequency neurotomy under fluoroscopic guidance  Indication: Low back pain due to lumbar spondylosis which has been relieved on 2 occasions by greater than 50% by lumbar medial branch blocks at corresponding levels.  Informed consent was obtained after describing risks and benefits of the procedure with the patient, this includes bleeding, bruising, infection, paralysis and medication side effects. The patient wishes to proceed and has given written consent. The patient was placed in a prone position. The lumbar and sacral area was marked and prepped with Betadine. A 25-gauge 1-1/2 inch needle was inserted into the skin and subcutaneous tissue at 3 sites in one ML of 1% lidocaine was injected into each site. Then a 20-gauge 10 cm radio frequency needle with a 1 cm curved active tip was inserted targeting the left S1 SAP/sacral ala junction. Bone contact was made and confirmed with lateral imaging. Sensory stimulation at 50 Hz followed by motor stimulation at 2 Hz confirm proper needle location followed by injection of one ML of the solution containing one ML of 4 mg per mL dexamethasone and 3 mL of 1% MPF lidocaine. Then the left L5 SAP/transverse process junction was targeted. Bone contact was made and confirmed with lateral imaging. Sensory stimulation at 50 Hz followed by motor stimulation at 2 Hz confirm proper needle location followed by injection of one ML of the solution containing one ML of 4 mg per mL dexamethasone and 3 mL of 1% MPF lidocaine. Then the left L4 SAP/transverse process junction was targeted. Bone contact was made and confirmed with lateral imaging. Sensory stimulation at 50 Hz followed by motor stimulation at 2 Hz confirm proper needle location followed by injection of one ML of the solution containing one ML of 4 mg per mL dexamethasone and 3 mL of 1% MPF lidocaine. Radio frequency lesion being at 80C for 90 seconds was  performed. Needles were removed. Post procedure instructions and vital signs were performed. Patient tolerated procedure well. Followup appointment was given.      

## 2018-05-02 NOTE — Progress Notes (Signed)
  PROCEDURE RECORD [facility]   Name: Sandra Tapia DOB:May 27, 1946 MRN: [id number]  Date:05/02/2018  Physician: Alysia Penna, MD    Nurse/CMA: Lyn Joens,CMA/Bright,CMA  Allergies:  Allergies  Allergen Reactions  . Penicillins     Nausea & stomach pains  . Adhesive [Tape] Rash    Gel grounding pads    Consent Signed: Yes.    Is patient diabetic? No.  CBG today?  Pregnant: No. LMP: No LMP recorded. Patient is postmenopausal. (age 20-55)  Anticoagulants: no Anti-inflammatory: no Antibiotics: no  Procedure: Radiofrequency Neurotomy Position: Prone   Start Time: 1126am End Time: 1149am Fluoro Time: 69s  RN/CMA Kacie Huxtable,CMA Bright CMA    Time 11:03 1154am    BP 144/78 132/79    Pulse 58 55    Respirations 14 14    O2 Sat 96 95    S/S 6 6    Pain Level 7/10 0/10     D/C home with husband [name], [name], D/C instructions reviewed, and sits independently.

## 2018-05-06 DIAGNOSIS — H35431 Paving stone degeneration of retina, right eye: Secondary | ICD-10-CM | POA: Diagnosis not present

## 2018-05-06 DIAGNOSIS — H35433 Paving stone degeneration of retina, bilateral: Secondary | ICD-10-CM | POA: Diagnosis not present

## 2018-05-06 DIAGNOSIS — H33002 Unspecified retinal detachment with retinal break, left eye: Secondary | ICD-10-CM | POA: Diagnosis not present

## 2018-05-06 DIAGNOSIS — H353132 Nonexudative age-related macular degeneration, bilateral, intermediate dry stage: Secondary | ICD-10-CM | POA: Diagnosis not present

## 2018-05-06 DIAGNOSIS — H40052 Ocular hypertension, left eye: Secondary | ICD-10-CM | POA: Diagnosis not present

## 2018-05-06 DIAGNOSIS — H40053 Ocular hypertension, bilateral: Secondary | ICD-10-CM | POA: Diagnosis not present

## 2018-05-06 DIAGNOSIS — H35352 Cystoid macular degeneration, left eye: Secondary | ICD-10-CM | POA: Diagnosis not present

## 2018-05-06 DIAGNOSIS — H43391 Other vitreous opacities, right eye: Secondary | ICD-10-CM | POA: Diagnosis not present

## 2018-06-02 ENCOUNTER — Encounter: Payer: Self-pay | Admitting: Physical Medicine & Rehabilitation

## 2018-06-02 ENCOUNTER — Encounter: Payer: PPO | Attending: Physical Medicine & Rehabilitation

## 2018-06-02 ENCOUNTER — Ambulatory Visit (HOSPITAL_BASED_OUTPATIENT_CLINIC_OR_DEPARTMENT_OTHER): Payer: PPO | Admitting: Physical Medicine & Rehabilitation

## 2018-06-02 VITALS — BP 125/73 | HR 51 | Wt 125.0 lb

## 2018-06-02 DIAGNOSIS — M533 Sacrococcygeal disorders, not elsewhere classified: Secondary | ICD-10-CM | POA: Insufficient documentation

## 2018-06-02 DIAGNOSIS — M545 Low back pain: Secondary | ICD-10-CM | POA: Diagnosis not present

## 2018-06-02 DIAGNOSIS — M47817 Spondylosis without myelopathy or radiculopathy, lumbosacral region: Secondary | ICD-10-CM

## 2018-06-02 NOTE — Progress Notes (Signed)
  PROCEDURE RECORD [facility]   Name: Sandra Tapia DOB:[birth year] MRN: [id number]  Date:06/02/2018  Physician: Alysia Penna, MD    Nurse/CMA: Betti Cruz  Allergies:  Allergies  Allergen Reactions  . Penicillins     Nausea & stomach pains  . Adhesive [Tape] Rash    Gel grounding pads    Consent Signed: Yes.    Is patient diabetic? No.  CBG today?   Pregnant: No. LMP: No LMP recorded. Patient is postmenopausal. (age 72-55)  Anticoagulants: no Anti-inflammatory: no Antibiotics: no  Procedure: Radiofrequency Right Position: Prone Start Time: 11:33am End Time: 11:55AM Fluoro Time: 43  RN/CMA Kedrick Mcnamee,CMA Johnell Landowski,CMA    Time 11:12am 11:58am    BP 125/73 131/77    Pulse 52 54    Respirations 14 14    O2 Sat 92 92    S/S 6 6    Pain Level 5/10 1/10     D/C home with husband, [name], D/C instructions reviewed, and sits independently.

## 2018-06-02 NOTE — Patient Instructions (Signed)

## 2018-06-02 NOTE — Progress Notes (Signed)
RightL5 dorsal ramus., Right L4 and Right L3 medial branch radio frequency neurotomy under fluoroscopic guidance   Indication: Low back pain due to lumbar spondylosis which has been relieved on 2 occasions by greater than 50% by lumbar medial branch blocks at corresponding levels.  Informed consent was obtained after describing risks and benefits of the procedure with the patient, this includes bleeding, bruising, infection, paralysis and medication side effects. The patient wishes to proceed and has given written consent. The patient was placed in a prone position. The lumbar and sacral area was marked and prepped with Betadine. A 25-gauge 1-1/2 inch needle was inserted into the skin and subcutaneous tissue at 3 sites in one ML of 1% lidocaine was injected into each site. Then a 18-gauge 10 cm radio frequency needle with a 1 cm curved active tip was inserted targeting the Right S1 SAP/sacral ala junction. Bone contact was made and confirmed with lateral imaging.  motor stimulation at 2 Hz confirm proper needle location followed by injection of 62ml 2% MPF lidocaine. Then the Right L5 SAP/transverse process junction was targeted. Bone contact was made and confirmed with lateral imaging.  motor stimulation at 2 Hz confirm proper needle location followed by injection of 40ml 2% MPF lidocaine. Then the Right L4 SAP/transverse process junction was targeted. Bone contact was made and confirmed with lateral imaging. motor stimulation at 2 Hz confirm proper needle location followed by injection of 58ml 2% MPF lidocaine. Radio frequency lesion being at [facility] for 90 seconds was performed. Needles were removed. Post procedure instructions and vital signs were performed. Patient tolerated procedure well. Followup appointment was given.  Because of her scoliosis each level needed to have separated fluoroscopic views

## 2018-06-08 ENCOUNTER — Other Ambulatory Visit: Payer: Self-pay | Admitting: *Deleted

## 2018-06-08 DIAGNOSIS — I10 Essential (primary) hypertension: Secondary | ICD-10-CM

## 2018-06-08 DIAGNOSIS — I77810 Thoracic aortic ectasia: Secondary | ICD-10-CM

## 2018-07-07 DIAGNOSIS — L820 Inflamed seborrheic keratosis: Secondary | ICD-10-CM | POA: Diagnosis not present

## 2018-08-15 ENCOUNTER — Other Ambulatory Visit: Payer: Self-pay | Admitting: Physical Medicine & Rehabilitation

## 2018-08-18 DIAGNOSIS — E559 Vitamin D deficiency, unspecified: Secondary | ICD-10-CM | POA: Diagnosis not present

## 2018-08-18 DIAGNOSIS — I10 Essential (primary) hypertension: Secondary | ICD-10-CM | POA: Diagnosis not present

## 2018-08-18 DIAGNOSIS — E782 Mixed hyperlipidemia: Secondary | ICD-10-CM | POA: Diagnosis not present

## 2018-08-18 DIAGNOSIS — Z791 Long term (current) use of non-steroidal anti-inflammatories (NSAID): Secondary | ICD-10-CM | POA: Diagnosis not present

## 2018-08-22 ENCOUNTER — Ambulatory Visit (INDEPENDENT_AMBULATORY_CARE_PROVIDER_SITE_OTHER): Payer: PPO | Admitting: Cardiology

## 2018-08-22 ENCOUNTER — Ambulatory Visit (HOSPITAL_COMMUNITY): Payer: PPO | Attending: Cardiology

## 2018-08-22 ENCOUNTER — Other Ambulatory Visit: Payer: Self-pay

## 2018-08-22 ENCOUNTER — Encounter: Payer: Self-pay | Admitting: Cardiology

## 2018-08-22 VITALS — BP 110/66 | HR 58 | Ht 63.0 in | Wt 122.1 lb

## 2018-08-22 DIAGNOSIS — I77810 Thoracic aortic ectasia: Secondary | ICD-10-CM

## 2018-08-22 DIAGNOSIS — I712 Thoracic aortic aneurysm, without rupture: Secondary | ICD-10-CM | POA: Insufficient documentation

## 2018-08-22 DIAGNOSIS — I083 Combined rheumatic disorders of mitral, aortic and tricuspid valves: Secondary | ICD-10-CM | POA: Insufficient documentation

## 2018-08-22 DIAGNOSIS — I429 Cardiomyopathy, unspecified: Secondary | ICD-10-CM | POA: Diagnosis not present

## 2018-08-22 DIAGNOSIS — I10 Essential (primary) hypertension: Secondary | ICD-10-CM

## 2018-08-22 NOTE — Progress Notes (Addendum)
[facility]. [address], [address] [place], [place]  [address] Phone: [phone] Fax:  [phone]  Date:  08/22/2018   ID:  Sandra, Tapia February 14, 1946, MRN [id number]  PCP:  Cari Caraway, MD   History of Present Illness: Sandra Tapia is a 72 y.o. female with aortic root of 4.1 -> 4.3 -> 4.35-->4.2 cm ascending aorta. She also has mild to moderate valvular disease. Prior MRI of chest demonstrated no evidence of coarctation. Normal ejection fraction.   Prior LDL cholesterol, she is on simvastatin, was under good control just above 100. She is also a recent breast cancer survivor with XRT/lumpectomy. No chemotherapy other than tamoxifen.  Prior exercise treadmill test reassuring. This was performed because of increasing dyspnea on exertion. She is no longer complaining of this symptom.  She will occasionally feel fluttering sensation, fleeting, periodic. No associated chest pain, shortness of breath. No syncope.  08/22/2018- sometimes feels some dizziness at times, some fatigue.  No chest pain, no syncope, no orthopnea, no PND.   Wt Readings from Last 3 Encounters:  08/22/18 122 lb 1.9 oz (55.4 kg)  06/02/18 125 lb (56.7 kg)  05/02/18 124 lb 12.8 oz (56.6 kg)     Past Medical History:  Diagnosis Date  . Aneurysm of aorta ([place]) 8/09   4.1cm ascending aortic aneurysm on breast MRI  . Basal cell cancer   . Cancer ([place]) 07/2008   breast cancer(rt)  . Degenerative disc disease    neck  . Elevated cholesterol   . Fibroid   . Hypertension   . Infertility, female   . Spinal arthritis     Past Surgical History:  Procedure Laterality Date  . ANKLE SURGERY    . BREAST SURGERY  8/09   (Rt)Lumpectomy,sentinel node  . EYE SURGERY    . MASTECTOMY, PARTIAL Right 07/31/08   right node Bx (neg)  . RETINAL DETACHMENT SURGERY  june 28th 2012  . TONSILLECTOMY AND ADENOIDECTOMY      Current Outpatient Medications  Medication Sig Dispense Refill  . ALPHAGAN P 0.1 %  SOLN Place 1 drop into the left eye 2 (two) times daily.     Marland Kitchen aspirin 81 MG tablet Take 81 mg by mouth daily.    Marland Kitchen atorvastatin (LIPITOR) 20 MG tablet Take 40 mg by mouth daily.     . Azelastine HCl 0.15 % SOLN Place 1 spray into the nose as needed.  5  . Biotin 10 MG CAPS Take 1 tablet by mouth daily. 1 tablet    . Calcium Carbonate Antacid (TUMS ULTRA 1000 PO) Take 3,000 mg by mouth daily.     . diclofenac (VOLTAREN) 75 MG EC tablet Take 1 tablet (75 mg total) by mouth 2 (two) times daily. 60 tablet 2  . dorzolamide-timolol (COSOPT) 22.3-6.8 MG/ML ophthalmic solution Place 1 drop into the left eye 2 (two) times daily.  12  . fexofenadine (ALLEGRA) 180 MG tablet Take 180 mg by mouth daily.    . fish oil-omega-3 fatty acids 1000 MG capsule Take 3 g by mouth daily.     Marland Kitchen ketorolac (ACULAR) 0.4 % SOLN Apply 1 drop to eye 4 (four) times daily.    Marland Kitchen lisinopril-hydrochlorothiazide (PRINZIDE,ZESTORETIC) 10-12.5 MG per tablet Take 1 tablet by mouth daily.    . Multiple Vitamin (MULTIVITAMIN) tablet Take 1 tablet by mouth daily.    Marland Kitchen omega-3 acid ethyl esters (LOVAZA) 1 g capsule Take 3,000 mg by mouth daily.    Marland Kitchen  pilocarpine (PILOCAR) 2 % ophthalmic solution Apply to eye as needed.     . prednisoLONE acetate (PRED FORTE) 1 % ophthalmic suspension Place 1 drop into the left eye daily.    . tazarotene (AVAGE) 0.1 % cream Apply 1 application topically at bedtime.    . traMADol (ULTRAM) 50 MG tablet Take 1 tablet (50 mg total) by mouth 2 (two) times daily. 60 tablet 2  . tretinoin (RETIN-A) 0.025 % cream Apply 1 application topically every other day.      No current facility-administered medications for this visit.     Allergies:    Allergies  Allergen Reactions  . Penicillins     Nausea & stomach pains  . Adhesive [Tape] Rash    Gel grounding pads    Social History:  The patient  reports that she has never smoked. She has never used smokeless tobacco. She reports that she drinks about 7.0  standard drinks of alcohol per week. She reports that she does not use drugs.   Family History  Problem Relation Age of Onset  . Hypertension Father   . Macular degeneration Father     ROS:  Please see the history of present illness.      PHYSICAL EXAM: VS:  BP 110/66   Pulse (!) 58   Ht 5\' 3"  (1.6 m)   Wt 122 lb 1.9 oz (55.4 kg)   BMI 21.63 kg/m  GEN: Thin, in no acute distress  HEENT: normal  Neck: no JVD, slightly accentuated pulsatility to her carotids, no bruits, or masses Cardiac: RRR;soft diastolic murmurs, no rubs, or gallops,no edema  Respiratory:  clear to auscultation bilaterally, normal work of breathing GI: soft, nontender, nondistended, + BS MS: no deformity or atrophy  Skin: warm and dry, no rash Neuro:  Alert and Oriented x 3, Strength and sensation are intact Psych: euthymic mood, full affect   EKG:  Today 08/22/2018-sinus bradycardia 58 with no other abnormalities.  08/18/16-sinus rhythm 63 with nonspecific ST-T wave changes, no change from prior personally viewed-, 08/19/15-sinus rhythm, 64, nonspecific ST-T wave changes-08/17/14-sinus rhythm, no other abnormalities    ECHO: 08/18/16 . - Aorta: Ascending aortic diameter: 81mm (S). - Ascending aorta: The ascending aorta was mildly dilated.  Echocardiogram 08/22/2018  -Ascending aortic root 44 mm  -Mild to moderate aortic regurgitation.  -Normal EF  ASSESSMENT AND PLAN:  1. Dilated aortic root-currently 42-44 mm. once again Stable. It has been quite stable for some time. We will continue to monitor.  It might be interesting to check a different modality next year, CT scan of aorta.  Avoid Cipro. 2. Hyperlipidemia - continue atorvastatin LDL 73. 3. Essential hypertension-blood pressure normal but has been quite low previously. 100/66. She and Dr. Leonides Schanz recently had appointment and she will continue to monitor this.  I think it may make sense since she has had some weight loss to consider discontinuing the HCTZ  portion of her medication.  She says he will discuss this with Dr. Leonides Schanz on Friday.  Sometimes she may feel a little bit of dizziness.  This may help. 4. Palpitations-occasional flutter-like sensation, no high-risk symptoms such as syncope.  Overall stable. 5. I would like to see her back in 1 year  Signed, Candee Furbish, MD [facility]  08/22/2018 12:15 PM

## 2018-08-22 NOTE — Patient Instructions (Signed)
Medication Instructions:  The current medical regimen is effective;  continue present plan and medications.  Testing/Procedures: Your physician has requested that you have cardiac CT in 1 year to look at your aorta. Cardiac computed tomography (CT) is a painless test that uses an x-ray machine to take clear, detailed pictures of your heart.  Follow-Up: Follow up in 1 year with Dr. Marlou Porch.  You will receive a letter in the mail 2 months before you are due.  Please call us when you receive this letter to schedule your follow up appointment.  If you need a refill on your cardiac medications before your next appointment, please call your pharmacy.  Thank you for choosing [facility]!!

## 2018-08-26 DIAGNOSIS — I712 Thoracic aortic aneurysm, without rupture: Secondary | ICD-10-CM | POA: Diagnosis not present

## 2018-08-26 DIAGNOSIS — Z853 Personal history of malignant neoplasm of breast: Secondary | ICD-10-CM | POA: Diagnosis not present

## 2018-08-26 DIAGNOSIS — H409 Unspecified glaucoma: Secondary | ICD-10-CM | POA: Diagnosis not present

## 2018-08-26 DIAGNOSIS — E559 Vitamin D deficiency, unspecified: Secondary | ICD-10-CM | POA: Diagnosis not present

## 2018-08-26 DIAGNOSIS — M85852 Other specified disorders of bone density and structure, left thigh: Secondary | ICD-10-CM | POA: Diagnosis not present

## 2018-08-26 DIAGNOSIS — Z791 Long term (current) use of non-steroidal anti-inflammatories (NSAID): Secondary | ICD-10-CM | POA: Diagnosis not present

## 2018-08-26 DIAGNOSIS — M150 Primary generalized (osteo)arthritis: Secondary | ICD-10-CM | POA: Diagnosis not present

## 2018-08-26 DIAGNOSIS — Z1159 Encounter for screening for other viral diseases: Secondary | ICD-10-CM | POA: Diagnosis not present

## 2018-08-26 DIAGNOSIS — E782 Mixed hyperlipidemia: Secondary | ICD-10-CM | POA: Diagnosis not present

## 2018-08-26 DIAGNOSIS — R05 Cough: Secondary | ICD-10-CM | POA: Diagnosis not present

## 2018-08-26 DIAGNOSIS — Z1389 Encounter for screening for other disorder: Secondary | ICD-10-CM | POA: Diagnosis not present

## 2018-08-26 DIAGNOSIS — Z0000 Encounter for general adult medical examination without abnormal findings: Secondary | ICD-10-CM | POA: Diagnosis not present

## 2018-08-26 DIAGNOSIS — I10 Essential (primary) hypertension: Secondary | ICD-10-CM | POA: Diagnosis not present

## 2018-09-02 DIAGNOSIS — H33002 Unspecified retinal detachment with retinal break, left eye: Secondary | ICD-10-CM | POA: Diagnosis not present

## 2018-09-02 DIAGNOSIS — H35431 Paving stone degeneration of retina, right eye: Secondary | ICD-10-CM | POA: Diagnosis not present

## 2018-09-02 DIAGNOSIS — H40053 Ocular hypertension, bilateral: Secondary | ICD-10-CM | POA: Diagnosis not present

## 2018-09-02 DIAGNOSIS — H35433 Paving stone degeneration of retina, bilateral: Secondary | ICD-10-CM | POA: Diagnosis not present

## 2018-09-02 DIAGNOSIS — H35352 Cystoid macular degeneration, left eye: Secondary | ICD-10-CM | POA: Diagnosis not present

## 2018-09-02 DIAGNOSIS — H353132 Nonexudative age-related macular degeneration, bilateral, intermediate dry stage: Secondary | ICD-10-CM | POA: Diagnosis not present

## 2018-09-02 DIAGNOSIS — H20022 Recurrent acute iridocyclitis, left eye: Secondary | ICD-10-CM | POA: Diagnosis not present

## 2018-09-15 DIAGNOSIS — Z853 Personal history of malignant neoplasm of breast: Secondary | ICD-10-CM | POA: Diagnosis not present

## 2018-09-15 DIAGNOSIS — Z1231 Encounter for screening mammogram for malignant neoplasm of breast: Secondary | ICD-10-CM | POA: Diagnosis not present

## 2018-10-06 ENCOUNTER — Ambulatory Visit: Payer: PPO | Admitting: Physical Medicine & Rehabilitation

## 2018-10-07 ENCOUNTER — Encounter: Payer: Self-pay | Admitting: Physical Medicine & Rehabilitation

## 2018-10-07 ENCOUNTER — Encounter: Payer: PPO | Attending: Physical Medicine & Rehabilitation

## 2018-10-07 ENCOUNTER — Ambulatory Visit (HOSPITAL_BASED_OUTPATIENT_CLINIC_OR_DEPARTMENT_OTHER): Payer: PPO | Admitting: Physical Medicine & Rehabilitation

## 2018-10-07 VITALS — BP 149/76 | HR 50 | Ht 63.0 in | Wt 125.4 lb

## 2018-10-07 DIAGNOSIS — Z853 Personal history of malignant neoplasm of breast: Secondary | ICD-10-CM | POA: Insufficient documentation

## 2018-10-07 DIAGNOSIS — I10 Essential (primary) hypertension: Secondary | ICD-10-CM | POA: Insufficient documentation

## 2018-10-07 DIAGNOSIS — Z9889 Other specified postprocedural states: Secondary | ICD-10-CM | POA: Insufficient documentation

## 2018-10-07 DIAGNOSIS — M47816 Spondylosis without myelopathy or radiculopathy, lumbar region: Secondary | ICD-10-CM | POA: Diagnosis not present

## 2018-10-07 DIAGNOSIS — M419 Scoliosis, unspecified: Secondary | ICD-10-CM

## 2018-10-07 DIAGNOSIS — M47817 Spondylosis without myelopathy or radiculopathy, lumbosacral region: Secondary | ICD-10-CM | POA: Diagnosis not present

## 2018-10-07 DIAGNOSIS — M47812 Spondylosis without myelopathy or radiculopathy, cervical region: Secondary | ICD-10-CM | POA: Diagnosis not present

## 2018-10-07 MED ORDER — TRAMADOL HCL 50 MG PO TABS: 50.0000 mg | ORAL_TABLET | Freq: Two times a day (BID) | ORAL | 5 refills | Status: DC

## 2018-10-07 NOTE — Progress Notes (Signed)
Subjective:    Patient ID: Sandra Tapia, female    DOB: 1946-06-16, 72 y.o.   MRN: [id number]  HPI  06/02/18 RightL5 dorsal ramus., Right L4 and Right L3 medial branch radio frequency neurotomy under fluoroscopic guidance  05/02/18 Left L5 dorsal ramus., left L4 and left L3 medial branch radio frequency neurotomy under fluoroscopic guidance   Neck and Back doing well Walking 30-63min per day 20lb weight loss.  "pays attention to calories"  Pain Inventory Average Pain 0 Pain Right Now 6 My pain is .  In the last 24 hours, has pain interfered with the following? General activity 7 Relation with others 6 Enjoyment of life 6 What TIME of day is your pain at its worst? daytime Sleep (in general) Fair  Pain is worse with: sitting, standing and some activites Pain improves with: rest, therapy/exercise, medication and injections Relief from Meds: 7  Mobility walk without assistance ability to climb steps?  yes  Function retired I need assistance with the following:  meal prep, household duties and shopping  Neuro/Psych weakness numbness  Prior Studies Any changes since last visit?  no  Physicians involved in your care Any changes since last visit?  no   Family History  Problem Relation Age of Onset  . Hypertension Father   . Macular degeneration Father    Social History   Socioeconomic History  . Marital status: Married    Spouse name: Not on file  . Number of children: Not on file  . Years of education: Not on file  . Highest education level: Not on file  Occupational History  . Not on file  Social Needs  . Financial resource strain: Not on file  . Food insecurity:    Worry: Not on file    Inability: Not on file  . Transportation needs:    Medical: Not on file    Non-medical: Not on file  Tobacco Use  . Smoking status: Never Smoker  . Smokeless tobacco: Never Used  Substance and Sexual Activity  . Alcohol use: Yes    Alcohol/week: 7.0  standard drinks    Types: 7 drink(s) per week    Comment: 6-7 glasses of wine a week  . Drug use: No  . Sexual activity: Yes    Partners: Male    Birth control/protection: Post-menopausal  Lifestyle  . Physical activity:    Days per week: Not on file    Minutes per session: Not on file  . Stress: Not on file  Relationships  . Social connections:    Talks on phone: Not on file    Gets together: Not on file    Attends religious service: Not on file    Active member of club or organization: Not on file    Attends meetings of clubs or organizations: Not on file    Relationship status: Not on file  Other Topics Concern  . Not on file  Social History Narrative  . Not on file   Past Surgical History:  Procedure Laterality Date  . ANKLE SURGERY    . BREAST SURGERY  8/09   (Rt)Lumpectomy,sentinel node  . EYE SURGERY    . MASTECTOMY, PARTIAL Right 07/31/08   right node Bx (neg)  . RETINAL DETACHMENT SURGERY  june 28th 2012  . TONSILLECTOMY AND ADENOIDECTOMY     Past Medical History:  Diagnosis Date  . Aneurysm of aorta ([place]) 8/09   4.1cm ascending aortic aneurysm on breast MRI  . Basal cell  cancer   . Cancer ([place]) 07/2008   breast cancer(rt)  . Degenerative disc disease    neck  . Elevated cholesterol   . Fibroid   . Hypertension   . Infertility, female   . Spinal arthritis    There were no vitals taken for this visit.  Opioid Risk Score:   Fall Risk Score:  `1  Depression screen PHQ 2/9  Depression screen [facility] 2/9 05/02/2018 03/12/2016 02/13/2016 07/22/2015  Decreased Interest 0 0 0 0  Down, Depressed, Hopeless 0 0 0 0  PHQ - 2 Score 0 0 0 0     Review of Systems  Constitutional: Positive for diaphoresis.  HENT: Negative.   Eyes: Negative.   Respiratory: Negative.   Cardiovascular: Negative.   Gastrointestinal: Negative.   Endocrine: Negative.   Genitourinary: Negative.   Musculoskeletal: Positive for arthralgias, back pain and myalgias.  Skin: Negative.     Allergic/Immunologic: Negative.   Neurological: Positive for weakness and numbness.  Hematological: Negative.   Psychiatric/Behavioral: Negative.   All other systems reviewed and are negative.      Objective:   Physical Exam  Constitutional: She is oriented to person, place, and time. She appears well-developed and well-nourished. No distress.  HENT:  Head: Normocephalic and atraumatic.  Eyes: Pupils are equal, round, and reactive to light. EOM are normal.  Musculoskeletal: Normal range of motion.  Neurological: She is alert and oriented to person, place, and time.  Skin: Skin is warm and dry. She is not diaphoretic.  Psychiatric: She has a normal mood and affect.  Nursing note and vitals reviewed.   Patient has a marked lumbar levoconvex scoliosis Flexion is full at the lumbar and thoracic spine extension is 25% of normal, lateral bending is 25% of normal bilaterally. Negative straight leg raising Motor strength is 5/5 bilateral deltoid bicep tricep grip hip flexor knee extensor ankle dorsiflex.       Assessment & Plan:  1.  Lumbar spondylosis without myelopathy in a patient with severe levoscoliosis and rotatory  Component.  She does get good results with lumbar medial branch blocks and radiofrequency neurotomy and historically has been getting around 6 months of relief.  Indication for chronic opioid: Lumbar scoliosis and spondylosis Medication and dose: # pills per month:tramadol 50 mg twice a day# 60 Last UDS date: 03/13/14 Opioid Treatment Agreement signed (Y/N): Y Opioid Treatment Agreement last reviewed with patient:   NCCSRS reviewed this encounter (include red flags):  10/07/2018 2.  Cervical Spondylosis without myelopathy or radiculopathy further pain sinceCervical medial branch blocks greater than 1 year ago

## 2018-10-12 DIAGNOSIS — L820 Inflamed seborrheic keratosis: Secondary | ICD-10-CM | POA: Diagnosis not present

## 2018-11-16 ENCOUNTER — Other Ambulatory Visit: Payer: Self-pay | Admitting: Physical Medicine & Rehabilitation

## 2018-12-26 ENCOUNTER — Encounter: Payer: Self-pay | Admitting: Internal Medicine

## 2018-12-26 ENCOUNTER — Ambulatory Visit (INDEPENDENT_AMBULATORY_CARE_PROVIDER_SITE_OTHER): Payer: PPO | Admitting: Internal Medicine

## 2018-12-26 ENCOUNTER — Ambulatory Visit (INDEPENDENT_AMBULATORY_CARE_PROVIDER_SITE_OTHER)
Admission: RE | Admit: 2018-12-26 | Discharge: 2018-12-26 | Disposition: A | Discharge status: Home / Self Care | Payer: PPO | Source: Ambulatory Visit | Attending: Internal Medicine | Admitting: Internal Medicine

## 2018-12-26 VITALS — BP 120/60 | HR 65 | Ht 63.0 in | Wt 126.8 lb

## 2018-12-26 DIAGNOSIS — R058 Other specified cough: Secondary | ICD-10-CM

## 2018-12-26 DIAGNOSIS — R9389 Abnormal findings on diagnostic imaging of other specified body structures: Secondary | ICD-10-CM | POA: Diagnosis not present

## 2018-12-26 DIAGNOSIS — R05 Cough: Secondary | ICD-10-CM

## 2018-12-26 DIAGNOSIS — I10 Essential (primary) hypertension: Secondary | ICD-10-CM

## 2018-12-26 DIAGNOSIS — J479 Bronchiectasis, uncomplicated: Secondary | ICD-10-CM | POA: Insufficient documentation

## 2018-12-26 MED ORDER — TELMISARTAN-HCTZ 40-12.5 MG PO TABS: 1.0000 | ORAL_TABLET | Freq: Every day | ORAL | 2 refills | Status: DC

## 2018-12-26 NOTE — Assessment & Plan Note (Signed)
In the best review of chronic cough to date ( NEJM 2016 375 [phone]) ,  ACEi are now felt to cause cough in up to  20% of pts which is a 4 fold increase from previous reports and does not include the variety of non-specific complaints we see in pulmonary clinic in pts on ACEi but previously attributed to another dx like  Copd/asthma and  include PNDS attributed to "allergies" , throat and chest congestion, "bronchitis", unexplained dyspnea and noct "strangling" sensations, and hoarseness, but also  atypical /refractory GERD symptoms like dysphagia and "bad heartburn"   The only way I know  to prove this is not an "ACEi Case" is a trial off ACEi x a minimum of 4 weeks then regroup.     >>> try micardis 40-12.5 one daily, ok to take one half if too strong, two if too weeks, and return to PCP if all better in 4 weeks, here if not

## 2018-12-26 NOTE — Assessment & Plan Note (Addendum)
S/p R breast RT 2009 with nl baseline cxr 01/27/18 and new plain cxr changes RLL post basal segment 08/26/18    Discussed in detail all the  indications, usual  risks and alternatives  relative to the benefits with patient who agrees to proceed with w/u as outlined.        Total time devoted to counseling  > 50 % of initial 60 min office visit:  review case with pt/husband discussion of options/alternatives/ personally creating written customized instructions  in presence of pt  then going over those specific  Instructions directly with the pt including how to use all of the meds but in particular covering each new medication in detail and the difference between the maintenance= "automatic" meds and the prns using an action plan format for the latter (If this problem/symptom => do that organization reading Left to right).  Please see AVS from this visit for a full list of these instructions which I personally wrote for this pt and  are unique to this visit.

## 2018-12-26 NOTE — Progress Notes (Signed)
Sandra Tapia, female    DOB: 03/22/1946,    MRN: [id number]   Brief patient profile:  63 yowf never smoker  "bronchitis" as child and did track in HS with onset of hoarseness, runny nose in springtime around early 2000  some itching / sneezing evolving to year round some better on allegra / flonase then onset abruptly Nov 28 2017 hoarseness and coughing and wheezing rx omeprazole by Dr Gus Height and seemed to improve on zantac in am and ppi otc in pm and then gradually worse since May 2019 attributed to zantac going off the market and since then cough worsened since then and changed to omeprazole 20 mg bid ac and better but cough persisted so referred to pulmonary clinic 12/26/2018 by Dr  Leonides Schanz.     History of Present Illness   12/26/2018  Pulmonary/ 1st office eval/Sandra Tapia  Chief Complaint  Patient presents with  . Pulmonary Consult    Referred by Dr Theadore Nan.  Pt c/o cough since Dec 2018. Cough is occ prod with yellow sputum.    Dyspnea:  Not limited by breathing from desired activities   Cough: worse in late am's, better with cough drops / min slt yellow mucus Sleep: able to lie flat, one pillow  SABA use: none   No obvious day to day or daytime variability or assoc excess/ purulent sputum or mucus plugs or hemoptysis or cp or chest tightness, subjective wheeze or overt sinus or hb symptoms.   Sleeping fine  without nocturnal  or early am exacerbation  of respiratory  c/o's or need for noct saba. Also denies any obvious fluctuation of symptoms with weather or environmental changes or other aggravating or alleviating factors except as outlined above   No unusual exposure hx or h/o childhood pna/ asthma or knowledge of premature birth.  Current Allergies, Complete Past Medical History, Past Surgical History, Family History, and Social History were reviewed in [employer] record.  ROS  The following are not active complaints unless bolded Hoarseness, sore  throat, dysphagia, dental problems, itching, sneezing,  nasal congestion or discharge of excess mucus or purulent secretions, ear ache,   fever, chills, sweats, unintended wt loss or wt gain, classically pleuritic or exertional cp,  orthopnea pnd or arm/hand swelling  or leg swelling, presyncope, palpitations, abdominal pain, anorexia, nausea, vomiting, diarrhea  or change in bowel habits or change in bladder habits, change in stools or change in urine, dysuria, hematuria,  rash, arthralgias, visual complaints, headache, numbness, weakness or ataxia or problems with walking or coordination,  change in mood or  memory.           Past Medical History:  Diagnosis Date  . Aneurysm of aorta ([place]) 8/09   4.1cm ascending aortic aneurysm on breast MRI  . Basal cell cancer   . Cancer ([place]) 07/2008   breast cancer(rt)  . Degenerative disc disease    neck  . Elevated cholesterol   . Fibroid   . Hypertension   . Infertility, female   . Spinal arthritis     Outpatient Medications Prior to Visit  Medication Sig Dispense Refill  . ALPHAGAN P 0.1 % SOLN Place 1 drop into the left eye 2 (two) times daily.     Marland Kitchen atorvastatin (LIPITOR) 20 MG tablet Take 40 mg by mouth daily.     . Azelastine HCl 0.15 % SOLN Place 1 spray into the nose as needed.  5  . Biotin 10 MG CAPS Take  1 tablet by mouth daily. 1 tablet    . Calcium Carbonate Antacid (TUMS ULTRA 1000 PO) Take 3,000 mg by mouth daily.     . diclofenac (VOLTAREN) 75 MG EC tablet Take 1 tablet (75 mg total) by mouth 2 (two) times daily. 60 tablet 2  . dorzolamide-timolol (COSOPT) 22.3-6.8 MG/ML ophthalmic solution Place 1 drop into the left eye 2 (two) times daily.  12  . fexofenadine (ALLEGRA) 180 MG tablet Take 180 mg by mouth daily.    . fish oil-omega-3 fatty acids 1000 MG capsule Take 3 g by mouth daily.     Marland Kitchen ketorolac (ACULAR) 0.4 % SOLN Apply 1 drop to eye 4 (four) times daily.    Marland Kitchen lisinopril-hydrochlorothiazide (PRINZIDE,ZESTORETIC) 10-12.5 MG  per tablet Take 1 tablet by mouth daily.    . Multiple Vitamin (MULTIVITAMIN) tablet Take 1 tablet by mouth daily.    Marland Kitchen omeprazole (PRILOSEC) 20 MG capsule Take 20 mg by mouth daily.    . pilocarpine (PILOCAR) 2 % ophthalmic solution Apply to eye as needed.     . prednisoLONE acetate (PRED FORTE) 1 % ophthalmic suspension Place 1 drop into the left eye daily.    . tazarotene (AVAGE) 0.1 % cream Apply 1 application topically at bedtime.    . traMADol (ULTRAM) 50 MG tablet Take 1 tablet (50 mg total) by mouth 2 (two) times daily. 60 tablet 5  . aspirin 81 MG tablet Take 81 mg by mouth daily.    . famotidine (PEPCID) 20 MG tablet Take 20 mg by mouth 2 (two) times daily.    Marland Kitchen omega-3 acid ethyl esters (LOVAZA) 1 g capsule Take 3,000 mg by mouth daily.    Marland Kitchen tretinoin (RETIN-A) 0.025 % cream Apply 1 application topically every other day.         Objective:     BP 120/60 (BP Location: Left Arm, Cuff Size: Normal)   Pulse 65   Ht 5\' 3"  (1.6 m)   Wt 126 lb 12.8 oz (57.5 kg)   SpO2 97%   BMI 22.46 kg/m   SpO2: 97 %  RA   Pleasant healthy appearing amb wf nad / did not cough at all during hx or exam   HEENT: nl dentition, turbinates bilaterally, and oropharynx. Nl external ear canals without cough reflex   NECK :  without JVD/Nodes/TM/ nl carotid upstrokes bilaterally   LUNGS: no acc muscle use,  Scoliotic cw with crackles on insp R Base post  without cough on insp or exp maneuvers   CV:  RRR  no s3 or murmur or increase in P2, and no edema   ABD:  soft and nontender with nl inspiratory excursion in the supine position. No bruits or organomegaly appreciated, bowel sounds nl  MS:  Nl gait/ ext warm without deformities, calf tenderness, cyanosis or clubbing No obvious joint restrictions   SKIN: warm and dry without lesions    NEURO:  alert, approp, nl sensorium with  no motor or cerebellar deficits apparent.     CXR PA and Lateral:   12/26/2018 :    I personally reviewed  images and agree with radiology impression as follows:   Mildly increased interstitial density in the right middle lobe and in the right lower lobe. This may reflect pneumonia. Given the progression of the findings since the previous study, chest CT scanning is recommended in an effort to exclude occult malignancy.       Assessment   Upper airway cough syndrome Onset  Dec 2018 while on ACEi > d/c'd 12/26/2018    Upper airway cough syndrome (previously labeled PNDS),  is so named because it's frequently impossible to sort out how much is  CR/sinusitis with freq throat clearing (which can be related to primary GERD)   vs  causing  secondary (" extra esophageal")  GERD from wide swings in gastric pressure that occur with throat clearing, often  promoting self use of mint and menthol lozenges that reduce the lower esophageal sphincter tone and exacerbate the problem further in a cyclical fashion.   These are the same pts (now being labeled as having "irritable larynx syndrome" by some cough centers) who not infrequently have a history of having failed to tolerate ace inhibitors,  dry powder inhalers or biphosphonates or report having atypical/extraesophageal reflux symptoms that don't respond to standard doses of PPI  and are easily confused as having aecopd or asthma flares by even experienced allergists/ pulmonologists (myself included).   >>> will first max rx for non-acid gerd, d/c acei and regoup in 4 weeks if not better       Essential hypertension In the best review of chronic cough to date ( NEJM 2016 [id number]) ,  ACEi are now felt to cause cough in up to  20% of pts which is a 4 fold increase from previous reports and does not include the variety of non-specific complaints we see in pulmonary clinic in pts on ACEi but previously attributed to another dx like  Copd/asthma and  include PNDS attributed to "allergies" , throat and chest congestion, "bronchitis", unexplained dyspnea and  noct "strangling" sensations, and hoarseness, but also  atypical /refractory GERD symptoms like dysphagia and "bad heartburn"   The only way I know  to prove this is not an "ACEi Case" is a trial off ACEi x a minimum of 4 weeks then regroup.   >>> try micardis 40-12.5 one daily, ok to take one half if too strong, two if too weeks, and return to PCP if all better in 4 weeks, here if not      Abnormal CXR S/p R breast RT 2009 with nl baseline cxr 01/27/18 and new plain cxr changes RLL post basal segment 08/26/18    Discussed in detail all the  indications, usual  risks and alternatives  relative to the benefits with patient who agrees to proceed with w/u as outlined.        Total time devoted to counseling  > 50 % of initial 60 min office visit:  review case with pt/husband discussion of options/alternatives/ personally creating written customized instructions  in presence of pt  then going over those specific  Instructions directly with the pt including how to use all of the meds but in particular covering each new medication in detail and the difference between the maintenance= "automatic" meds and the prns using an action plan format for the latter (If this problem/symptom => do that organization reading Left to right).  Please see AVS from this visit for a full list of these instructions which I personally wrote for this pt and  are unique to this visit.        Christinia Gully, MD 12/26/2018

## 2018-12-26 NOTE — Assessment & Plan Note (Signed)
Onset Dec 2018 while on ACEi > d/c'd 12/26/2018    Upper airway cough syndrome (previously labeled PNDS),  is so named because it's frequently impossible to sort out how much is  CR/sinusitis with freq throat clearing (which can be related to primary GERD)   vs  causing  secondary (" extra esophageal")  GERD from wide swings in gastric pressure that occur with throat clearing, often  promoting self use of mint and menthol lozenges that reduce the lower esophageal sphincter tone and exacerbate the problem further in a cyclical fashion.   These are the same pts (now being labeled as having "irritable larynx syndrome" by some cough centers) who not infrequently have a history of having failed to tolerate ace inhibitors,  dry powder inhalers or biphosphonates or report having atypical/extraesophageal reflux symptoms that don't respond to standard doses of PPI  and are easily confused as having aecopd or asthma flares by even experienced allergists/ pulmonologists (myself included).    >>> will first max rx for non-acid gerd, d/c acei and regoup in 4 weeks if not better

## 2018-12-26 NOTE — Patient Instructions (Addendum)
Stop lisinopril and start micardis 40-12.5 one daily (take a half if too strong, take two if too weak  Omeprazole 20 mg Take 30- 60 min before your first and last meals of the day (until 100% better, then just once a day should do.   GERD (REFLUX)  is an extremely common cause of respiratory symptoms just like yours , many times with no obvious heartburn at all.    It can be treated with medication, but also with lifestyle changes including elevation of the head of your bed (ideally with 6-8 inch  bed blocks),  Smoking cessation, avoidance of late meals, excessive alcohol, and avoid fatty foods, chocolate, peppermint, colas, red wine, and acidic juices such as orange juice.  NO MINT OR MENTHOL PRODUCTS SO NO COUGH DROPS   USE SUGARLESS CANDY INSTEAD (Jolley ranchers or Stover's or Life Savers) or even ice chips will also do - the key is to swallow to prevent all throat clearing. NO OIL BASED VITAMINS - use powdered substitutes.   Please remember to go to the  x-ray department  For your cxr - we will call you with the results when they are available.   If all better in 4 weeks, see Dr Leonides Schanz, if not see me

## 2018-12-27 ENCOUNTER — Other Ambulatory Visit: Payer: Self-pay | Admitting: Internal Medicine

## 2018-12-27 ENCOUNTER — Encounter: Payer: Self-pay | Admitting: Internal Medicine

## 2018-12-27 DIAGNOSIS — R05 Cough: Secondary | ICD-10-CM

## 2018-12-27 DIAGNOSIS — R059 Cough, unspecified: Secondary | ICD-10-CM

## 2018-12-27 DIAGNOSIS — R9389 Abnormal findings on diagnostic imaging of other specified body structures: Secondary | ICD-10-CM

## 2018-12-27 NOTE — Progress Notes (Signed)
Ct chest ordered  I called Dr Guido Sander office and asked them to fax her bmet  She last had this 08/18/18  Needs to come and fax it done here  Lab order placed

## 2018-12-30 DIAGNOSIS — H33002 Unspecified retinal detachment with retinal break, left eye: Secondary | ICD-10-CM | POA: Diagnosis not present

## 2018-12-30 DIAGNOSIS — H353132 Nonexudative age-related macular degeneration, bilateral, intermediate dry stage: Secondary | ICD-10-CM | POA: Diagnosis not present

## 2018-12-30 DIAGNOSIS — H35433 Paving stone degeneration of retina, bilateral: Secondary | ICD-10-CM | POA: Diagnosis not present

## 2018-12-30 DIAGNOSIS — H35352 Cystoid macular degeneration, left eye: Secondary | ICD-10-CM | POA: Diagnosis not present

## 2018-12-30 DIAGNOSIS — H20022 Recurrent acute iridocyclitis, left eye: Secondary | ICD-10-CM | POA: Diagnosis not present

## 2018-12-30 DIAGNOSIS — H43391 Other vitreous opacities, right eye: Secondary | ICD-10-CM | POA: Diagnosis not present

## 2018-12-30 DIAGNOSIS — H40053 Ocular hypertension, bilateral: Secondary | ICD-10-CM | POA: Diagnosis not present

## 2019-01-04 ENCOUNTER — Other Ambulatory Visit (INDEPENDENT_AMBULATORY_CARE_PROVIDER_SITE_OTHER): Payer: PPO

## 2019-01-04 DIAGNOSIS — R9389 Abnormal findings on diagnostic imaging of other specified body structures: Secondary | ICD-10-CM

## 2019-01-04 LAB — BASIC METABOLIC PANEL
BUN: 10 mg/dL (ref 6–23)
CO2: 29 mEq/L (ref 19–32)
Calcium: 10.1 mg/dL (ref 8.4–10.5)
Chloride: 101 mEq/L (ref 96–112)
Creatinine, Ser: 0.64 mg/dL (ref 0.40–1.20)
GFR: 96.78 mL/min (ref >60.00)
Glucose, Bld: 83 mg/dL (ref 70–99)
POTASSIUM: 3.6 meq/L (ref 3.5–5.1)
SODIUM: 137 meq/L (ref 135–145)

## 2019-01-04 NOTE — Progress Notes (Signed)
Spoke with pt and notified of results per Dr. Wert. Pt verbalized understanding and denied any questions. 

## 2019-01-24 ENCOUNTER — Ambulatory Visit (INDEPENDENT_AMBULATORY_CARE_PROVIDER_SITE_OTHER)
Admission: RE | Admit: 2019-01-24 | Discharge: 2019-01-24 | Disposition: A | Discharge status: Home / Self Care | Payer: PPO | Source: Ambulatory Visit | Attending: Internal Medicine | Admitting: Internal Medicine

## 2019-01-24 DIAGNOSIS — R05 Cough: Secondary | ICD-10-CM

## 2019-01-24 DIAGNOSIS — R059 Cough, unspecified: Secondary | ICD-10-CM

## 2019-01-24 DIAGNOSIS — R9389 Abnormal findings on diagnostic imaging of other specified body structures: Secondary | ICD-10-CM | POA: Diagnosis not present

## 2019-01-24 MED ORDER — IOPAMIDOL (ISOVUE-300) INJECTION 61%
80.0000 mL | Freq: Once | INTRAVENOUS | Status: AC | PRN
  Administered 2019-01-24: 80 mL via INTRAVENOUS

## 2019-01-25 ENCOUNTER — Encounter: Payer: Self-pay | Admitting: Internal Medicine

## 2019-01-25 ENCOUNTER — Ambulatory Visit (INDEPENDENT_AMBULATORY_CARE_PROVIDER_SITE_OTHER): Payer: PPO | Admitting: Internal Medicine

## 2019-01-25 VITALS — BP 134/74 | HR 61 | Ht 63.0 in | Wt 126.6 lb

## 2019-01-25 DIAGNOSIS — I712 Thoracic aortic aneurysm, without rupture: Secondary | ICD-10-CM

## 2019-01-25 DIAGNOSIS — J479 Bronchiectasis, uncomplicated: Secondary | ICD-10-CM

## 2019-01-25 DIAGNOSIS — I10 Essential (primary) hypertension: Secondary | ICD-10-CM | POA: Diagnosis not present

## 2019-01-25 DIAGNOSIS — I7121 Aneurysm of the ascending aorta, without rupture: Secondary | ICD-10-CM

## 2019-01-25 MED ORDER — TELMISARTAN 40 MG PO TABS: 40.0000 mg | ORAL_TABLET | Freq: Every day | ORAL | 2 refills | Status: DC

## 2019-01-25 NOTE — Progress Notes (Signed)
Sandra Tapia, female    DOB: Jun 12, 1946,    MRN: [id number]     Brief patient profile:  24 yowf never smoker  "bronchitis" as child and did track in HS with onset of hoarseness, runny nose in springtime around early 2000  some itching / sneezing evolving to year round some better on allegra / flonase then onset abruptly Nov 28 2017 hoarseness and coughing and wheezing rx omeprazole by Dr Gus Height and seemed to improve on zantac in am and ppi otc in pm and then gradually worse since May 2019 attributed to zantac going off the market and since then cough worsened since then and changed to omeprazole 20 mg bid ac and better but cough persisted so referred to pulmonary clinic 12/26/2018 by Dr  Leonides Schanz.     History of Present Illness   12/26/2018  Pulmonary/ 1st office eval/Sandra Tapia  Chief Complaint  Patient presents with  . Pulmonary Consult    Referred by Dr Theadore Nan.  Pt c/o cough since Dec 2018. Cough is occ prod with yellow sputum.    Dyspnea:  Not limited by breathing from desired activities   Cough: worse in late am's, better with cough drops / min slt yellow mucus Sleep: able to lie flat, one pillow  SABA use: none  rec Stop lisinopril and start micardis 40-12.5 one daily (take a half if too strong, take two if too weak Omeprazole 20 mg Take 30- 60 min before your first and last meals of the day (until 100% better, then just once a day should do. GERD diet   01/25/2019  f/u ov/Sandra Tapia re: acei cough/ bronchiectasis/ hbp over treated on micardis 40-12.5 just taking a quarter Chief Complaint  Patient presents with  . Follow-up    Cough is much improved. She states she has some congestion in her throat in the am.    Dyspnea:  Not limited by breathing from desired activities   Cough: sense of globus day not noct Sleeping: fine flat SABA use: none  02: none    No obvious day to day or daytime variability or assoc excess/ purulent sputum or mucus plugs or hemoptysis or cp or chest  tightness, subjective wheeze or overt sinus or hb symptoms.   Sleeping  without nocturnal  or early am exacerbation  of respiratory  c/o's or need for noct saba. Also denies any obvious fluctuation of symptoms with weather or environmental changes or other aggravating or alleviating factors except as outlined above   No unusual exposure hx or h/o childhood pna/ asthma or knowledge of premature birth.  Current Allergies, Complete Past Medical History, Past Surgical History, Family History, and Social History were reviewed in [employer] record.  ROS  The following are not active complaints unless bolded Hoarseness, sore throat, dysphagia, dental problems, itching, sneezing,  nasal congestion or discharge of excess mucus or purulent secretions, ear ache,   fever, chills, sweats, unintended wt loss or wt gain, classically pleuritic or exertional cp,  orthopnea pnd or arm/hand swelling  or leg swelling, presyncope, palpitations, abdominal pain, anorexia, nausea, vomiting, diarrhea  or change in bowel habits or change in bladder habits, change in stools or change in urine, dysuria, hematuria,  rash, arthralgias, visual complaints, headache, numbness, weakness or ataxia or problems with walking or coordination,  change in mood or  memory.        Current Meds  Medication Sig  . ALPHAGAN P 0.1 % SOLN Place 1 drop into the  left eye 2 (two) times daily.   Marland Kitchen atorvastatin (LIPITOR) 20 MG tablet Take 40 mg by mouth daily.   . Azelastine HCl 0.15 % SOLN Place 1 spray into the nose as needed.  . Biotin 10 MG CAPS Take 1 tablet by mouth daily. 1 tablet  . Calcium Carbonate Antacid (TUMS ULTRA 1000 PO) Take 3,000 mg by mouth daily.   . diclofenac (VOLTAREN) 75 MG EC tablet Take 1 tablet (75 mg total) by mouth 2 (two) times daily.  . dorzolamide-timolol (COSOPT) 22.3-6.8 MG/ML ophthalmic solution Place 1 drop into the left eye 2 (two) times daily.  . fexofenadine (ALLEGRA) 180 MG tablet  Take 180 mg by mouth daily.  . fish oil-omega-3 fatty acids 1000 MG capsule Take 3 g by mouth daily.   Marland Kitchen ketorolac (ACULAR) 0.4 % SOLN Apply 1 drop to eye 4 (four) times daily.  . Multiple Vitamin (MULTIVITAMIN) tablet Take 1 tablet by mouth daily.  Marland Kitchen omeprazole (PRILOSEC) 20 MG capsule Take 20 mg by mouth daily.  . pilocarpine (PILOCAR) 2 % ophthalmic solution Apply to eye as needed.   . prednisoLONE acetate (PRED FORTE) 1 % ophthalmic suspension Place 1 drop into the left eye daily.  . tazarotene (AVAGE) 0.1 % cream Apply 1 application topically at bedtime.  . traMADol (ULTRAM) 50 MG tablet Take 1 tablet (50 mg total) by mouth 2 (two) times daily.  Marland Kitchen   telmisartan-hydrochlorothiazide (MICARDIS HCT) 40-12.5 MG tablet Take 1 tablet by mouth daily. (Patient taking differently: 1/4 TABLET DAILY)                        Objective:    amb wf nad   Wt Readings from Last 3 Encounters:  01/25/19 126 lb 9.6 oz (57.4 kg)  12/26/18 126 lb 12.8 oz (57.5 kg)  10/07/18 125 lb 6.4 oz (56.9 kg)     Vital signs reviewed - Note on arrival 02 sats  96% on RA and bp 134/74            Scoliotic cw with crackles on insp R Base post     HEENT: nl dentition, turbinates bilaterally, and oropharynx. Nl external ear canals without cough reflex   NECK :  without JVD/Nodes/TM/ nl carotid upstrokes bilaterally   LUNGS: no acc muscle use,  Scoliotic chest with insp crackles in R base without cough on insp or exp maneuvers   CV:  RRR  no s3 - II/V1 diast m but no   increase in P2, and no edema   ABD:  soft and nontender with nl inspiratory excursion in the supine position. No bruits or organomegaly appreciated, bowel sounds nl  MS:  Nl gait/ ext warm without deformities, calf tenderness, cyanosis or clubbing No obvious joint restrictions   SKIN: warm and dry without lesions    NEURO:  alert, approp, nl sensorium with  no motor or cerebellar deficits apparent.     I personally reviewed  images and agree with radiology impression as follows:   Chest HRCT  01/24/19 1. Spectrum of findings within the right lung including bronchiectasis airspace consolidation and tree-in-bud nodularity identified. Imaging findings are favored to represent sequelae of chronic, atypical infection such as MAI. Recurrent aspiration is also a differential consideration. Recommend follow-up imaging after appropriate therapy with repeat CT of the chest to assess for any temporal change in the appearance of the lungs 2.  4.1 Ascending TA    Labs ordered 01/25/2019  Quant Ig's, alpha one screen      Assessment

## 2019-01-25 NOTE — Patient Instructions (Addendum)
Bronchiectasis =   you have scarring of your bronchial tubes which means that they don't function perfectly normally and mucus tends to pool in certain areas of your lung which can cause pneumonia and further scarring of your lung and bronchial tubes(think of a broken escalator)  Whenever you develop cough congestion take mucinex or mucinex dm > these will help keep the mucus loose and flowing but if your condition worsens you need to seek help immediately preferably here or somewhere inside the [facility] system to compare xrays ( worse = darker or bloody mucus or pain on breathing in)    Please remember to go to the lab department   for your tests - we will call you with the results when they are available.      Please schedule a follow up visit in 3 months but call sooner if needed with pfts on return

## 2019-01-26 ENCOUNTER — Encounter: Payer: Self-pay | Admitting: Internal Medicine

## 2019-01-26 DIAGNOSIS — I712 Thoracic aortic aneurysm, without rupture: Secondary | ICD-10-CM | POA: Insufficient documentation

## 2019-01-26 DIAGNOSIS — I7121 Aneurysm of the ascending aorta, without rupture: Secondary | ICD-10-CM | POA: Insufficient documentation

## 2019-01-26 NOTE — Assessment & Plan Note (Addendum)
Assoc with scoliosis/ S/p R breast RT 2009 with nl baseline cxr 01/27/18 and new plain cxr changes RLL post basal segment 08/26/18  - CT chest with contrast 01/24/2019  1. Spectrum of findings within the right lung including bronchiectasis airspace consolidation and tree-in-bud nodularity identified. Imaging findings are favored to represent sequelae of chronic, atypical infection such as MAI. Recurrent aspiration is also a differential consideration. - Quant Ig's  01/25/2019  Ok - Alpha one screen 01/25/2019    This is an extremely common benign condition in the elderly and does not warrant aggressive eval/ rx at this point unless there is a clinical correlation suggesting unaddressed pulmonary infection (purulent sputum, night sweats, unintended wt loss, doe) or evolution of  obvious changes on plain cxr (as opposed to serial CT, which is way over sensitive to make clinical decisions re intervention and treatment in the elderly, who tend to tolerate both dx and treatment poorly) .     Discussed pathophysiology of bronchiectasis with pt and husband using broken escalator analogy/ rec mucinex dm prn and continued rx for gerd which is freq assoc though not clear as to cause/ effect.  Next step:  Check labs as above, return in 3 m for pfts

## 2019-01-26 NOTE — Assessment & Plan Note (Signed)
Try off acei 12/26/2018 due to cough > residual globus 01/25/2019   Although even in retrospect it may not be clear the ACEi contributed to the pt's symptoms,  Pt improved off them and adding them back at this point or in the future would risk confusion in interpretation of non-specific respiratory symptoms to which this patient is prone  ie  Better not to muddy the waters here.   Will change to micardis 40 mg one half daily as she feels the 40-12.5 is too strong and will self monitor

## 2019-01-26 NOTE — Assessment & Plan Note (Addendum)
See ct chest 01/24/19 = 4.1 cm   Already as f/u with Cards/ Skains and this is no change from baseline   I had an extended discussion with the patient/ husband reviewing all relevant studies completed to date and  lasting 15 to 20 minutes of a 25 minute visit    Each maintenance medication was reviewed in detail including most importantly the difference between maintenance and prns and under what circumstances the prns are to be triggered using an action plan format that is not reflected in the computer generated alphabetically organized AVS.     Please see AVS for specific instructions unique to this visit that I personally wrote and verbalized to the the pt in detail and then reviewed with pt  by my nurse highlighting any  changes in therapy recommended at today's visit to their plan of care.

## 2019-02-01 NOTE — Progress Notes (Signed)
LMTCB

## 2019-02-02 LAB — ALPHA-1 ANTITRYPSIN PHENOTYPE: A-1 Antitrypsin, Ser: 97 mg/dL (ref 83–199)

## 2019-02-02 LAB — IGG, IGA, IGM
IgG (Immunoglobin G), Serum: 1367 mg/dL (ref 600–1540)
IgM, Serum: 49 mg/dL — ABNORMAL LOW (ref 50–300)
Immunoglobulin A: 187 mg/dL (ref 70–320)

## 2019-02-02 NOTE — Progress Notes (Signed)
LMTCB

## 2019-02-03 ENCOUNTER — Encounter: Payer: Self-pay | Admitting: *Deleted

## 2019-02-03 NOTE — Progress Notes (Signed)
Letter mailed

## 2019-02-06 ENCOUNTER — Telehealth: Payer: Self-pay | Admitting: Internal Medicine

## 2019-02-06 NOTE — Telephone Encounter (Signed)
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 Be sure patient has/keeps f/u ov so we can go over all the details of this study and get a plan together moving forward - ok to move up f/u if not feeling better and wants to be seen sooner  ------------------------------------------------- Spoke with pt. She is aware of results. Nothing further was needed.

## 2019-02-27 DIAGNOSIS — Z791 Long term (current) use of non-steroidal anti-inflammatories (NSAID): Secondary | ICD-10-CM | POA: Diagnosis not present

## 2019-02-27 DIAGNOSIS — I712 Thoracic aortic aneurysm, without rupture: Secondary | ICD-10-CM | POA: Diagnosis not present

## 2019-02-27 DIAGNOSIS — M85852 Other specified disorders of bone density and structure, left thigh: Secondary | ICD-10-CM | POA: Diagnosis not present

## 2019-02-27 DIAGNOSIS — I10 Essential (primary) hypertension: Secondary | ICD-10-CM | POA: Diagnosis not present

## 2019-02-27 DIAGNOSIS — R05 Cough: Secondary | ICD-10-CM | POA: Diagnosis not present

## 2019-02-27 DIAGNOSIS — J301 Allergic rhinitis due to pollen: Secondary | ICD-10-CM | POA: Diagnosis not present

## 2019-02-27 DIAGNOSIS — E782 Mixed hyperlipidemia: Secondary | ICD-10-CM | POA: Diagnosis not present

## 2019-02-27 DIAGNOSIS — M150 Primary generalized (osteo)arthritis: Secondary | ICD-10-CM | POA: Diagnosis not present

## 2019-02-27 DIAGNOSIS — H409 Unspecified glaucoma: Secondary | ICD-10-CM | POA: Diagnosis not present

## 2019-02-27 DIAGNOSIS — L719 Rosacea, unspecified: Secondary | ICD-10-CM | POA: Diagnosis not present

## 2019-02-27 DIAGNOSIS — Z1159 Encounter for screening for other viral diseases: Secondary | ICD-10-CM | POA: Diagnosis not present

## 2019-02-27 DIAGNOSIS — E559 Vitamin D deficiency, unspecified: Secondary | ICD-10-CM | POA: Diagnosis not present

## 2019-02-27 DIAGNOSIS — M545 Low back pain: Secondary | ICD-10-CM | POA: Diagnosis not present

## 2019-03-02 DIAGNOSIS — I712 Thoracic aortic aneurysm, without rupture: Secondary | ICD-10-CM | POA: Diagnosis not present

## 2019-03-02 DIAGNOSIS — B351 Tinea unguium: Secondary | ICD-10-CM | POA: Diagnosis not present

## 2019-03-02 DIAGNOSIS — L719 Rosacea, unspecified: Secondary | ICD-10-CM | POA: Diagnosis not present

## 2019-03-02 DIAGNOSIS — I10 Essential (primary) hypertension: Secondary | ICD-10-CM | POA: Diagnosis not present

## 2019-03-02 DIAGNOSIS — Z853 Personal history of malignant neoplasm of breast: Secondary | ICD-10-CM | POA: Diagnosis not present

## 2019-03-02 DIAGNOSIS — M150 Primary generalized (osteo)arthritis: Secondary | ICD-10-CM | POA: Diagnosis not present

## 2019-03-02 DIAGNOSIS — H409 Unspecified glaucoma: Secondary | ICD-10-CM | POA: Diagnosis not present

## 2019-03-02 DIAGNOSIS — E782 Mixed hyperlipidemia: Secondary | ICD-10-CM | POA: Diagnosis not present

## 2019-03-02 DIAGNOSIS — M545 Low back pain: Secondary | ICD-10-CM | POA: Diagnosis not present

## 2019-03-02 DIAGNOSIS — J301 Allergic rhinitis due to pollen: Secondary | ICD-10-CM | POA: Diagnosis not present

## 2019-03-02 DIAGNOSIS — Z791 Long term (current) use of non-steroidal anti-inflammatories (NSAID): Secondary | ICD-10-CM | POA: Diagnosis not present

## 2019-03-02 DIAGNOSIS — E559 Vitamin D deficiency, unspecified: Secondary | ICD-10-CM | POA: Diagnosis not present

## 2019-03-03 ENCOUNTER — Telehealth: Payer: Self-pay | Admitting: Physical Medicine & Rehabilitation

## 2019-03-03 NOTE — Telephone Encounter (Signed)
Patient is requesting a left RFA. Last one was done 04/2018. Is this okay to schedule?

## 2019-03-03 NOTE — Telephone Encounter (Signed)
Pt is calling requesting a back injection. Please advise.

## 2019-03-08 NOTE — Telephone Encounter (Signed)
Patient elected to wait on the injection and speak to him about the radiation on her next scheduled appointment with him.

## 2019-03-08 NOTE — Telephone Encounter (Signed)
Ok to schedule.

## 2019-04-13 ENCOUNTER — Ambulatory Visit: Payer: PPO | Admitting: Physical Medicine & Rehabilitation

## 2019-04-24 DIAGNOSIS — H409 Unspecified glaucoma: Secondary | ICD-10-CM | POA: Diagnosis not present

## 2019-04-24 DIAGNOSIS — Z853 Personal history of malignant neoplasm of breast: Secondary | ICD-10-CM | POA: Diagnosis not present

## 2019-04-24 DIAGNOSIS — M150 Primary generalized (osteo)arthritis: Secondary | ICD-10-CM | POA: Diagnosis not present

## 2019-04-24 DIAGNOSIS — I10 Essential (primary) hypertension: Secondary | ICD-10-CM | POA: Diagnosis not present

## 2019-04-24 DIAGNOSIS — E782 Mixed hyperlipidemia: Secondary | ICD-10-CM | POA: Diagnosis not present

## 2019-04-26 ENCOUNTER — Ambulatory Visit: Payer: PPO | Admitting: Internal Medicine

## 2019-05-11 ENCOUNTER — Other Ambulatory Visit: Payer: Self-pay | Admitting: Internal Medicine

## 2019-05-11 ENCOUNTER — Other Ambulatory Visit: Payer: Self-pay | Admitting: Physical Medicine & Rehabilitation

## 2019-05-19 ENCOUNTER — Other Ambulatory Visit: Payer: Self-pay

## 2019-05-19 ENCOUNTER — Encounter: Payer: Self-pay | Admitting: Physical Medicine & Rehabilitation

## 2019-05-19 ENCOUNTER — Encounter: Payer: PPO | Attending: Physical Medicine & Rehabilitation | Admitting: Physical Medicine & Rehabilitation

## 2019-05-19 VITALS — Ht 63.0 in | Wt 121.0 lb

## 2019-05-19 DIAGNOSIS — M545 Low back pain: Secondary | ICD-10-CM | POA: Diagnosis not present

## 2019-05-19 DIAGNOSIS — M47817 Spondylosis without myelopathy or radiculopathy, lumbosacral region: Secondary | ICD-10-CM

## 2019-05-19 DIAGNOSIS — G8929 Other chronic pain: Secondary | ICD-10-CM

## 2019-05-19 MED ORDER — TRAMADOL HCL 50 MG PO TABS: 50.0000 mg | ORAL_TABLET | Freq: Two times a day (BID) | ORAL | 5 refills | Status: DC

## 2019-05-19 NOTE — Progress Notes (Signed)
Subjective:  Consent for phone visit obtained verbally  Patient ID: Sandra Tapia, female    DOB: 1946/04/17, 73 y.o.   MRN: [id number] 06/02/18 RightL5 dorsal ramus., Right L4 and Right L3 medial branch radio frequency neurotomy under fluoroscopic guidance  05/02/18 Left L5 dorsal ramus., left L4 and left L3 medial branch radio frequency neurotomy under fluoroscopic guidance  HPI   Taking tramadol 50mg  BID takes in am and takes ~3pm.  No constipation but is taking fiber pills and stoll softeners  Diagnosed with bronchiectasis,sees Dr Melvyn Novas    Back was hurting again in March but has gotten better again Doing a lot of exercise, walking and stretching on a regular basis  Doing housework up to 2 hours max before resting Pain Inventory Average Pain 5 Pain Right Now 0 My pain is intermittent, stabbing and aching  In the last 24 hours, has pain interfered with the following? General activity 6 Relation with others 6 Enjoyment of life 6 What TIME of day is your pain at its worst? daytime Sleep (in general) Fair  Pain is worse with: walking, bending, standing and some activites Pain improves with: rest, therapy/exercise and medication Relief from Meds: 9  Mobility walk without assistance ability to climb steps?  yes do you drive?  yes  Function retired  Neuro/Psych numbness tingling  Prior Studies Any changes since last visit?  no  Physicians involved in your care Any changes since last visit?  no   Family History  Problem Relation Age of Onset  . Hypertension Father   . Macular degeneration Father    Social History   Socioeconomic History  . Marital status: Married    Spouse name: Not on file  . Number of children: Not on file  . Years of education: Not on file  . Highest education level: Not on file  Occupational History  . Not on file  Social Needs  . Financial resource strain: Not on file  . Food insecurity:    Worry: Not on file    Inability: Not  on file  . Transportation needs:    Medical: Not on file    Non-medical: Not on file  Tobacco Use  . Smoking status: Never Smoker  . Smokeless tobacco: Never Used  Substance and Sexual Activity  . Alcohol use: Yes    Alcohol/week: 7.0 standard drinks    Types: 7 drink(s) per week    Comment: 6-7 glasses of wine a week  . Drug use: No  . Sexual activity: Yes    Partners: Male    Birth control/protection: Post-menopausal  Lifestyle  . Physical activity:    Days per week: Not on file    Minutes per session: Not on file  . Stress: Not on file  Relationships  . Social connections:    Talks on phone: Not on file    Gets together: Not on file    Attends religious service: Not on file    Active member of club or organization: Not on file    Attends meetings of clubs or organizations: Not on file    Relationship status: Not on file  Other Topics Concern  . Not on file  Social History Narrative  . Not on file   Past Surgical History:  Procedure Laterality Date  . ANKLE SURGERY    . BREAST SURGERY  8/09   (Rt)Lumpectomy,sentinel node  . EYE SURGERY    . MASTECTOMY, PARTIAL Right 07/31/08   right node Bx (neg)  .  RETINAL DETACHMENT SURGERY  june 28th 2012  . TONSILLECTOMY AND ADENOIDECTOMY     Past Medical History:  Diagnosis Date  . Aneurysm of aorta ([place]) 8/09   4.1cm ascending aortic aneurysm on breast MRI  . Basal cell cancer   . Cancer ([place]) 07/2008   breast cancer(rt)  . Degenerative disc disease    neck  . Elevated cholesterol   . Fibroid   . Hypertension   . Infertility, female   . Spinal arthritis    There were no vitals taken for this visit.  Opioid Risk Score:   Fall Risk Score:  `1  Depression screen PHQ 2/9  Depression screen [facility] 2/9 05/02/2018 03/12/2016 02/13/2016 07/22/2015  Decreased Interest 0 0 0 0  Down, Depressed, Hopeless 0 0 0 0  PHQ - 2 Score 0 0 0 0     Review of Systems  Constitutional: Negative.   HENT: Negative.   Eyes: Negative.    Respiratory: Negative.   Cardiovascular: Negative.   Gastrointestinal: Negative.   Endocrine: Negative.   Genitourinary: Negative.   Musculoskeletal: Positive for arthralgias, back pain and myalgias.  Skin: Negative.   Allergic/Immunologic: Negative.   Neurological: Positive for numbness.  Hematological: Negative.   Psychiatric/Behavioral: Negative.   All other systems reviewed and are negative.      Objective:   Physical Exam Nursing note reviewed.  Constitutional:      Appearance: Normal appearance.  HENT:     Nose: No congestion or rhinorrhea.     Mouth/Throat:     Mouth: Mucous membranes are moist.  Eyes:     Extraocular Movements: Extraocular movements intact.     Conjunctiva/sclera: Conjunctivae normal.     Pupils: Pupils are equal, round, and reactive to light.  Neurological:     Mental Status: She is alert and oriented to person, place, and time. Mental status is at baseline.  Psychiatric:        Mood and Affect: Mood normal.        Behavior: Behavior normal.   Remainder of examination is deferred secondary to phone visit        Assessment & Plan:  1.  Lumbar spondylosis without myelopathy the patient has been doing well managing her pain using tramadol 50 mg twice daily.  We reviewed the dates of her last radiofrequency neurotomies which treated the L4-5 and L5-S1 facet joint complexes. May 2019 left L3-4-5 radiofrequency neurotomy June 2019 right L3-4-5 radiofrequency neurotomy   January 2018 left L3-4-5 radiofrequency neurotomy February 2018 right L3-4-5 radiofrequency neurotomy  Based on previous duration of 16 months, may expect recurrence of severe pain in September or October 2020.  In the meantime we will continue tramadol 50 mg twice daily PDMP reviewed Urine toxicology reviewed, repeat next visit

## 2019-05-25 ENCOUNTER — Ambulatory Visit: Payer: PPO | Admitting: Physical Medicine & Rehabilitation

## 2019-07-06 DIAGNOSIS — L820 Inflamed seborrheic keratosis: Secondary | ICD-10-CM | POA: Diagnosis not present

## 2019-07-13 ENCOUNTER — Other Ambulatory Visit: Payer: Self-pay | Admitting: Internal Medicine

## 2019-07-21 DIAGNOSIS — H33002 Unspecified retinal detachment with retinal break, left eye: Secondary | ICD-10-CM | POA: Diagnosis not present

## 2019-07-21 DIAGNOSIS — H353132 Nonexudative age-related macular degeneration, bilateral, intermediate dry stage: Secondary | ICD-10-CM | POA: Diagnosis not present

## 2019-07-21 DIAGNOSIS — H35433 Paving stone degeneration of retina, bilateral: Secondary | ICD-10-CM | POA: Diagnosis not present

## 2019-07-21 DIAGNOSIS — H35352 Cystoid macular degeneration, left eye: Secondary | ICD-10-CM | POA: Diagnosis not present

## 2019-07-22 ENCOUNTER — Other Ambulatory Visit (HOSPITAL_COMMUNITY)
Admission: RE | Admit: 2019-07-22 | Discharge: 2019-07-22 | Disposition: A | Discharge status: Home / Self Care | Payer: PPO | Source: Ambulatory Visit | Attending: Internal Medicine | Admitting: Internal Medicine

## 2019-07-22 DIAGNOSIS — Z1159 Encounter for screening for other viral diseases: Secondary | ICD-10-CM | POA: Diagnosis not present

## 2019-07-22 LAB — SARS CORONAVIRUS 2 (TAT 6-24 HRS): SARS Coronavirus 2: NEGATIVE

## 2019-07-24 ENCOUNTER — Other Ambulatory Visit: Payer: Self-pay

## 2019-07-24 ENCOUNTER — Encounter: Payer: Self-pay | Admitting: Physical Medicine & Rehabilitation

## 2019-07-24 ENCOUNTER — Encounter: Payer: PPO | Attending: Physical Medicine & Rehabilitation | Admitting: Physical Medicine & Rehabilitation

## 2019-07-24 VITALS — BP 148/78 | HR 60 | Temp 97.5°F | Ht 63.0 in | Wt 124.0 lb

## 2019-07-24 DIAGNOSIS — M47816 Spondylosis without myelopathy or radiculopathy, lumbar region: Secondary | ICD-10-CM | POA: Insufficient documentation

## 2019-07-24 DIAGNOSIS — M47817 Spondylosis without myelopathy or radiculopathy, lumbosacral region: Secondary | ICD-10-CM

## 2019-07-24 DIAGNOSIS — M545 Low back pain: Secondary | ICD-10-CM | POA: Diagnosis not present

## 2019-07-24 MED ORDER — DICLOFENAC SODIUM 75 MG PO TBEC: 75.0000 mg | DELAYED_RELEASE_TABLET | Freq: Two times a day (BID) | ORAL | 2 refills | Status: DC

## 2019-07-24 NOTE — Progress Notes (Signed)
Left L5 dorsal ramus., left L4 and left L3 medial branch radio frequency neurotomy under fluoroscopic guidance  Indication: Low back pain due to lumbar spondylosis which has been relieved on 2 occasions by greater than 50% by lumbar medial branch blocks at corresponding levels.  Informed consent was obtained after describing risks and benefits of the procedure with the patient, this includes bleeding, bruising, infection, paralysis and medication side effects. The patient wishes to proceed and has given written consent. The patient was placed in a prone position. The lumbar and sacral area was marked and prepped with Betadine. A 25-gauge 1-1/2 inch needle was inserted into the skin and subcutaneous tissue at 3 sites in one ML of 1% lidocaine was injected into each site. Then a 20-gauge 10 cm radio frequency needle with a 1 cm curved active tip was inserted targeting the left S1 SAP/sacral ala junction. Bone contact was made and confirmed with lateral imaging. Sensory stimulation at 50 Hz followed by motor stimulation at 2 Hz confirm proper needle location followed by injection of one ML of the solution containing one ML of 4 mg per mL dexamethasone and 3 mL of 1% MPF lidocaine. Then the left L5 SAP/transverse process junction was targeted. Bone contact was made and confirmed with lateral imaging. Sensory stimulation at 50 Hz followed by motor stimulation at 2 Hz confirm proper needle location followed by injection of one ML of the solution containing one ML of 4 mg per mL dexamethasone and 3 mL of 1% MPF lidocaine. Then the left L4 SAP/transverse process junction was targeted. Bone contact was made and confirmed with lateral imaging. Sensory stimulation at 50 Hz followed by motor stimulation at 2 Hz confirm proper needle location followed by injection of one ML of the solution containing one ML of 4 mg per mL dexamethasone and 3 mL of 1% MPF lidocaine. Radio frequency lesion being at 80C for 90 seconds was  performed. Needles were removed. Post procedure instructions and vital signs were performed. Patient tolerated procedure well. Followup appointment was given.      

## 2019-07-24 NOTE — Addendum Note (Signed)
Addended by: Charlett Blake on: 07/24/2019 04:10 PM   Modules accepted: Orders

## 2019-07-24 NOTE — Patient Instructions (Addendum)
You had a radio frequency procedure today This was done to alleviate joint pain in your lumbar area We injected lidocaine which is a local anesthetic.  You may experience soreness at the injection sites. You may also experienced some irritation of the nerves that were heated I'm recommending ice for 30 minutes every 2 hours as needed for the next 24-48 hours   

## 2019-07-24 NOTE — Progress Notes (Signed)
mychart result note sent

## 2019-07-24 NOTE — Progress Notes (Signed)
  PROCEDURE RECORD [facility]   Name: Sandra Tapia DOB:[birth year] MRN: [id number]  Date:07/24/2019  Physician: Alysia Penna, MD    Nurse/CMA: Bright CMA   Allergies:  Allergies  Allergen Reactions  . Penicillins     Nausea & stomach pains  . Adhesive [Tape] Rash    Gel grounding pads    Consent Signed: Yes.    Is patient diabetic? No.  CBG today? NA  Pregnant: No. LMP: No LMP recorded. Patient is postmenopausal. (age [age])  Anticoagulants: no Anti-inflammatory: no Antibiotics: no  Procedure: Radiofrequency Left L3-5 osition: Prone   Start Time: 201pm End Time: 215pm Fluoro Time: 54s  RN/CMA Bright CMA Bright CMA    Time 142pm 223pm    BP 148/78 127/73    Pulse 60 60    Respirations 16 16    O2 Sat 97 95    S/S 6 6    Pain Level 7/10 0/10     D/C home with Husband, [name], D/C instructions reviewed, and sits independently.

## 2019-07-25 ENCOUNTER — Telehealth: Payer: Self-pay | Admitting: Cardiology

## 2019-07-25 NOTE — Telephone Encounter (Signed)
Patient called and wanted to know what kind of tests needed done before her semi-annual appointment. She states she usually has an echo and labs done before her appointment, then the results are discussed at the appointment. She also mentioned that Dr. Marlou Porch mentioned doing a CT or another test to look at her heart. She did mention that she had a CT done in January of her lungs at [facility], if that would help Dr. Marlou Porch see anything.  She currently has an appointment scheduled for 09/28/19.   Please let Scheduling know what tests she will need, and we will schedule accordingly.

## 2019-07-25 NOTE — Telephone Encounter (Signed)
Pt last saw Dr Marlou Porch 08/22/2018 and is to return in 1 yr. He suggested Cardiac CT of arota for dilated aortic root prior to her f/u visit.  Pt did have CT- chest 12/2018 demonstrating enlarged ascending thoracic aorta (4.1) with recommendation to repeat in 1 year.  Last BMP 01/04/2019 was NL.  No recent Lipid/ALT in Epic - unsure if PCP is following [facility].  Will forward to Dr Marlou Porch for review and any orders.

## 2019-07-26 ENCOUNTER — Ambulatory Visit (INDEPENDENT_AMBULATORY_CARE_PROVIDER_SITE_OTHER): Payer: PPO

## 2019-07-26 ENCOUNTER — Ambulatory Visit (INDEPENDENT_AMBULATORY_CARE_PROVIDER_SITE_OTHER): Payer: PPO | Admitting: Internal Medicine

## 2019-07-26 ENCOUNTER — Other Ambulatory Visit: Payer: Self-pay

## 2019-07-26 ENCOUNTER — Encounter: Payer: Self-pay | Admitting: Internal Medicine

## 2019-07-26 DIAGNOSIS — J479 Bronchiectasis, uncomplicated: Secondary | ICD-10-CM | POA: Diagnosis not present

## 2019-07-26 DIAGNOSIS — I10 Essential (primary) hypertension: Secondary | ICD-10-CM | POA: Diagnosis not present

## 2019-07-26 DIAGNOSIS — J310 Chronic rhinitis: Secondary | ICD-10-CM

## 2019-07-26 LAB — PULMONARY FUNCTION TEST
DL/VA % pred: 111 %
DL/VA: 4.62 ml/min/mmHg/L
DLCO unc % pred: 110 %
DLCO unc: 20.26 ml/min/mmHg
FEF 25-75 Post: 1.85 L/sec
FEF 25-75 Pre: 1.33 L/sec
FEF2575-%Change-Post: 39 %
FEF2575-%Pred-Post: 110 %
FEF2575-%Pred-Pre: 79 %
FEV1-%Change-Post: 7 %
FEV1-%Pred-Post: 95 %
FEV1-%Pred-Pre: 88 %
FEV1-Post: 1.95 L
FEV1-Pre: 1.81 L
FEV1FVC-%Change-Post: 2 %
FEV1FVC-%Pred-Pre: 98 %
FEV6-%Change-Post: 4 %
FEV6-%Pred-Post: 99 %
FEV6-%Pred-Pre: 94 %
FEV6-Post: 2.56 L
FEV6-Pre: 2.44 L
FEV6FVC-%Pred-Post: 105 %
FEV6FVC-%Pred-Pre: 105 %
FVC-%Change-Post: 4 %
FVC-%Pred-Post: 94 %
FVC-%Pred-Pre: 90 %
FVC-Post: 2.56 L
FVC-Pre: 2.44 L
Post FEV1/FVC ratio: 76 %
Post FEV6/FVC ratio: 100 %
Pre FEV1/FVC ratio: 74 %
Pre FEV6/FVC Ratio: 100 %
RV % pred: 157 %
RV: 3.43 L
TLC % pred: 123 %
TLC: 5.98 L

## 2019-07-26 NOTE — Patient Instructions (Addendum)
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    Please schedule a follow up visit in 12 months but call sooner if needed

## 2019-07-26 NOTE — Progress Notes (Signed)
Sandra Tapia, female    DOB: October 03, 1946    MRN: [id number]     Brief patient profile:  22 yowf never smoker  MZ phenotype with  "bronchitis" as child and did track in HS with onset of hoarseness, runny nose in springtime around early 2000  some itching / sneezing evolving to year round some better on allegra / flonase then onset abruptly Nov 28 2017 hoarseness and coughing and wheezing rx omeprazole by Dr Leonides Schanz and seemed to improve on zantac in am and ppi otc in pm and then gradually worse since May 2019 attributed to zantac going off the market and since then cough worsened since then and changed to omeprazole 20 mg bid ac and better but cough persisted so referred to pulmonary clinic 12/26/2018 by Dr  Leonides Schanz.     History of Present Illness   12/26/2018  Pulmonary/ 1st office eval/[place] [facility]  Chief Complaint  Patient presents with  . Pulmonary Consult    Referred by Dr Theadore Nan.  Pt c/o cough since Dec 2018. Cough is occ prod with yellow sputum.    Dyspnea:  Not limited by breathing from desired activities   Cough: worse in late am's, better with cough drops / min slt yellow mucus Sleep: able to lie flat, one pillow  SABA use: none  rec Stop lisinopril and start micardis 40-12.5 one daily (take a half if too strong, take two if too weak Omeprazole 20 mg Take 30- 60 min before your first and last meals of the day (until 100% better, then just once a day should do. GERD diet   01/25/2019  f/u ov/Suhan Paci re: acei cough/ bronchiectasis/ hbp over treated on micardis 40-12.5 just taking a quarter Chief Complaint  Patient presents with  . Follow-up    Cough is much improved. She states she has some congestion in her throat in the am.   Dyspnea:  Not limited by breathing from desired activities   Cough: sense of globus day not noct Sleeping: fine flat SABA use: none  02: none rec Bronchiectasis =   you have scarring of your bronchial tubes which means that they don't function  perfectly normally and mucus tends to pool in certain areas of your lung which can cause pneumonia and further scarring of your lung and bronchial tubes(think of a broken escalator) Whenever you develop cough congestion take mucinex or mucinex dm        07/26/2019  f/u ov/Earlean Fidalgo re:  Bronchiectasis with MZ phenotype, acei case Chief Complaint  Patient presents with  . Follow-up    PFT's done today. She states her cough has been better. No new co's.   Dyspnea:  Not limited by breathing from desired activities  / walks daily up some hill, good pace no problem Cough: no but constant sense of pnds some  better nose burns from the astelin   Sleeping: ok flat one pillow  SABA use: none  02: none    No obvious day to day or daytime variability or assoc excess/ purulent sputum or mucus plugs or hemoptysis or cp or chest tightness, subjective wheeze or overt   hb symptoms.   Sleeping ok as above without nocturnal  or early am exacerbation  of respiratory  c/o's or need for noct saba. Also denies any obvious fluctuation of symptoms with weather or environmental changes or other aggravating or alleviating factors except as outlined above   No unusual exposure hx or h/o childhood pna/ asthma or knowledge of  premature birth.  Current Allergies, Complete Past Medical History, Past Surgical History, Family History, and Social History were reviewed in [employer] record.  ROS  The following are not active complaints unless bolded Hoarseness, sore throat, dysphagia, dental problems, itching, sneezing,  nasal congestion or discharge of excess mucus or purulent secretions, ear ache,   fever, chills, sweats, unintended wt loss or wt gain, classically pleuritic or exertional cp,  orthopnea pnd or arm/hand swelling  or leg swelling, presyncope, palpitations, abdominal pain, anorexia, nausea, vomiting, diarrhea  or change in bowel habits or change in bladder habits, change in stools or change  in urine, dysuria, hematuria,  rash, arthralgias, visual complaints, headache, numbness, weakness or ataxia or problems with walking or coordination,  change in mood or  memory.        Current Meds  Medication Sig  . ALPHAGAN P 0.1 % SOLN Place 1 drop into the left eye 2 (two) times daily.   Marland Kitchen atorvastatin (LIPITOR) 20 MG tablet Take 40 mg by mouth daily.   . Azelastine HCl 0.15 % SOLN Place 1 spray into the nose as needed.  . Biotin 10 MG CAPS Take 1 tablet by mouth daily. 1 tablet  . Calcium Carbonate Antacid (TUMS ULTRA 1000 PO) Take 3,000 mg by mouth daily.   . diclofenac (VOLTAREN) 75 MG EC tablet Take 1 tablet (75 mg total) by mouth 2 (two) times daily.  . dorzolamide-timolol (COSOPT) 22.3-6.8 MG/ML ophthalmic solution Place 1 drop into the left eye 2 (two) times daily.  . fexofenadine (ALLEGRA) 180 MG tablet Take 180 mg by mouth daily.  . fish oil-omega-3 fatty acids 1000 MG capsule Take 3 g by mouth daily.   . hydrochlorothiazide (HYDRODIURIL) 12.5 MG tablet Take 12.5 mg by mouth daily.  Marland Kitchen ketorolac (ACULAR) 0.4 % SOLN Apply 1 drop to eye 4 (four) times daily.  . Multiple Vitamin (MULTIVITAMIN) tablet Take 1 tablet by mouth daily.  Marland Kitchen omeprazole (PRILOSEC) 20 MG capsule Take 20 mg by mouth daily.  . pilocarpine (PILOCAR) 2 % ophthalmic solution Apply to eye as needed.   . prednisoLONE acetate (PRED FORTE) 1 % ophthalmic suspension Place 1 drop into the left eye daily.  . tazarotene (AVAGE) 0.1 % cream Apply 1 application topically at bedtime.  Marland Kitchen telmisartan (MICARDIS) 40 MG tablet Take 1 tablet by mouth once daily  . traMADol (ULTRAM) 50 MG tablet Take 1 tablet (50 mg total) by mouth 2 (two) times daily.                     Labs ordered 07/26/2019   Allergy profile                  Objective:    amb wf nad  Vital signs reviewed - Note on arrival 02 sats  95% on RA and bp 130/62     07/26/2019   01/25/19 126 lb 9.6 oz (57.4 kg)  12/26/18 126 lb 12.8 oz (57.5 kg)   10/07/18 125 lb 6.4 oz (56.9 kg)     HEENT: nl dentition, turbinates bilaterally, and oropharynx. Nl external ear canals without cough reflex   NECK :  without JVD/Nodes/TM/ nl carotid upstrokes bilaterally   LUNGS: no acc muscle use,  Scoliotic chest with min crackles in basese R >L  bilaterally without cough on insp or exp maneuvers   CV:  RRR  no s3 II/VI SEM no increase in P2, and no edema   ABD:  soft and nontender with nl inspiratory excursion in the supine position. No bruits or organomegaly appreciated, bowel sounds nl  MS:  Nl gait/ ext warm without deformities, calf tenderness, cyanosis or clubbing No obvious joint restrictions   SKIN: warm and dry without lesions    NEURO:  alert, approp, nl sensorium with  no motor or cerebellar deficits apparent.           Assessment

## 2019-07-26 NOTE — Progress Notes (Signed)
Full PFT performed today. °

## 2019-07-26 NOTE — Telephone Encounter (Signed)
Check BMET, ALT, Lipids. Agree with waiting on CT of aorta in Jan 2021 Candee Furbish, MD

## 2019-07-27 LAB — RESPIRATORY ALLERGY PROFILE REGION II ~~LOC~~

## 2019-07-27 LAB — INTERPRETATION:

## 2019-07-27 NOTE — Progress Notes (Signed)
Spoke with pt and notified of results per Dr. Wert. Pt verbalized understanding and denied any questions. 

## 2019-07-27 NOTE — Telephone Encounter (Signed)
Pt aware of Dr Marlou Porch recommendations.  She is scheduled to have the mentioned labs completed at her PCP office in September.  She was grateful for the call back and information.

## 2019-07-28 ENCOUNTER — Encounter: Payer: Self-pay | Admitting: Internal Medicine

## 2019-07-28 DIAGNOSIS — J310 Chronic rhinitis: Secondary | ICD-10-CM | POA: Insufficient documentation

## 2019-07-28 NOTE — Assessment & Plan Note (Signed)
Assoc with scoliosis/ S/p R breast RT 2009 with nl baseline cxr 01/27/18 and new plain cxr changes RLL post basal segment 08/26/18  - CT chest with contrast 01/24/2019  1. Spectrum of findings within the right lung including bronchiectasis airspace consolidation and tree-in-bud nodularity identified. Imaging findings are favored to represent sequelae of chronic, atypical infection such as MAI. Recurrent aspiration is also a differential consideration. - Quant Ig's  01/25/2019  Ok - Alpha one screen 01/25/2019  MZ Level 97  - PFT's  07/26/2019  FEV1 1.95 (95 % ) ratio 0.76  p 7 % improvement from saba p no prior to study with DLCO  120.26  (110%) corrects to 4.62 (111%)  for alv volume and FV curve nl   Reviewed pfts and expectations for bronchiectasis which is permanent but can be managed with mucolytics, flutter if necessary, and abx prn flares

## 2019-07-28 NOTE — Assessment & Plan Note (Addendum)
Try off acei 12/26/2018 due to cough > residual globus 01/25/2019   Globus has improved but still has sense of pnds c/w irritable larynx so acei not good choice here and has adequate control on present rx, reviewed in detail with pt > no change in rx needed  > continue off acei and on micardis 40 mg daily

## 2019-07-28 NOTE — Assessment & Plan Note (Addendum)
Onset 2000 -  Allergy profile 07/26/2019 >    IgE  43 neg RAST    rec astelin directed toward ear on same side to avoid septum and if not effective add flonase otc (vs dymsita RX which is usually very expensive / f/u with ent prn.   I had an extended discussion with the patient reviewing all relevant studies completed to date and  lasting 15 to 20 minutes of a 25 minute visit    I performed detailed device teaching using a teach back method which extended face to face time for this visit (see above)  Each maintenance medication was reviewed in detail including emphasizing most importantly the difference between maintenance and prns and under what circumstances the prns are to be triggered using an action plan format that is not reflected in the computer generated alphabetically organized AVS which I have not found useful in most complex patients, especially with respiratory illnesses  Please see AVS for specific instructions unique to this visit that I personally wrote and verbalized to the the pt in detail and then reviewed with pt  by my nurse highlighting any  changes in therapy recommended at today's visit to their plan of care.

## 2019-08-07 ENCOUNTER — Other Ambulatory Visit: Payer: Self-pay | Admitting: Internal Medicine

## 2019-08-28 ENCOUNTER — Encounter: Payer: PPO | Attending: Physical Medicine & Rehabilitation | Admitting: Physical Medicine & Rehabilitation

## 2019-08-28 ENCOUNTER — Other Ambulatory Visit: Payer: Self-pay

## 2019-08-28 VITALS — BP 137/76 | HR 53 | Temp 98.6°F | Ht 63.0 in | Wt 124.0 lb

## 2019-08-28 DIAGNOSIS — M545 Low back pain: Secondary | ICD-10-CM | POA: Diagnosis not present

## 2019-08-28 DIAGNOSIS — M47816 Spondylosis without myelopathy or radiculopathy, lumbar region: Secondary | ICD-10-CM | POA: Diagnosis not present

## 2019-08-28 DIAGNOSIS — M47817 Spondylosis without myelopathy or radiculopathy, lumbosacral region: Secondary | ICD-10-CM

## 2019-08-28 NOTE — Progress Notes (Signed)

## 2019-08-28 NOTE — Progress Notes (Signed)
  PROCEDURE RECORD [facility]   Name: Sandra Tapia DOB:Jan 30, 1946 MRN: [id number]  Date:08/28/2019  Physician: Alysia Penna, MD    Nurse/CMA: Truman Hayward, CMA  Allergies:  Allergies  Allergen Reactions  . Penicillins     Nausea & stomach pains  . Adhesive [Tape] Rash    Gel grounding pads    Consent Signed: Yes.    Is patient diabetic? No.  CBG today? na  Pregnant: No. LMP: No LMP recorded. Patient is postmenopausal. (age 60-55)  Anticoagulants: no Anti-inflammatory: no Antibiotics: no  Procedure: Right L3-4 Medial Branch Block and L5 Dorsal Ramus Radiofrequency Position: Prone Start Time: 3:04pm End Time: 3:25pm Fluoro Time: 23  RN/CMA Truman Hayward, CMA Schylar Wuebker, CMA    Time 2:43pm 3:28pm    BP 137/76 160/75    Pulse 53 55    Respirations 16 16    O2 Sat 94 95    S/S 6 6    Pain Level 5/10 0/10     D/C home with husband, [name], D/C instructions reviewed, and sits independently.

## 2019-08-28 NOTE — Patient Instructions (Signed)
You had a radio frequency procedure today This was done to alleviate joint pain in your lumbar area We injected lidocaine which is a local anesthetic.  You may experience soreness at the injection sites. You may also experienced some irritation of the nerves that were heated I'm recommending ice for 30 minutes every 2 hours as needed for the next 24-48 hours   

## 2019-09-14 DIAGNOSIS — M545 Low back pain: Secondary | ICD-10-CM | POA: Diagnosis not present

## 2019-09-14 DIAGNOSIS — M150 Primary generalized (osteo)arthritis: Secondary | ICD-10-CM | POA: Diagnosis not present

## 2019-09-14 DIAGNOSIS — R7301 Impaired fasting glucose: Secondary | ICD-10-CM | POA: Diagnosis not present

## 2019-09-14 DIAGNOSIS — Z791 Long term (current) use of non-steroidal anti-inflammatories (NSAID): Secondary | ICD-10-CM | POA: Diagnosis not present

## 2019-09-14 DIAGNOSIS — I10 Essential (primary) hypertension: Secondary | ICD-10-CM | POA: Diagnosis not present

## 2019-09-14 DIAGNOSIS — E782 Mixed hyperlipidemia: Secondary | ICD-10-CM | POA: Diagnosis not present

## 2019-09-14 DIAGNOSIS — E559 Vitamin D deficiency, unspecified: Secondary | ICD-10-CM | POA: Diagnosis not present

## 2019-09-14 DIAGNOSIS — Z23 Encounter for immunization: Secondary | ICD-10-CM | POA: Diagnosis not present

## 2019-09-14 DIAGNOSIS — I712 Thoracic aortic aneurysm, without rupture: Secondary | ICD-10-CM | POA: Diagnosis not present

## 2019-09-14 DIAGNOSIS — L719 Rosacea, unspecified: Secondary | ICD-10-CM | POA: Diagnosis not present

## 2019-09-19 DIAGNOSIS — Z1389 Encounter for screening for other disorder: Secondary | ICD-10-CM | POA: Diagnosis not present

## 2019-09-19 DIAGNOSIS — M545 Low back pain: Secondary | ICD-10-CM | POA: Diagnosis not present

## 2019-09-19 DIAGNOSIS — I10 Essential (primary) hypertension: Secondary | ICD-10-CM | POA: Diagnosis not present

## 2019-09-19 DIAGNOSIS — I712 Thoracic aortic aneurysm, without rupture: Secondary | ICD-10-CM | POA: Diagnosis not present

## 2019-09-19 DIAGNOSIS — J479 Bronchiectasis, uncomplicated: Secondary | ICD-10-CM | POA: Diagnosis not present

## 2019-09-19 DIAGNOSIS — R7303 Prediabetes: Secondary | ICD-10-CM | POA: Diagnosis not present

## 2019-09-19 DIAGNOSIS — M150 Primary generalized (osteo)arthritis: Secondary | ICD-10-CM | POA: Diagnosis not present

## 2019-09-19 DIAGNOSIS — E559 Vitamin D deficiency, unspecified: Secondary | ICD-10-CM | POA: Diagnosis not present

## 2019-09-19 DIAGNOSIS — K219 Gastro-esophageal reflux disease without esophagitis: Secondary | ICD-10-CM | POA: Diagnosis not present

## 2019-09-19 DIAGNOSIS — E782 Mixed hyperlipidemia: Secondary | ICD-10-CM | POA: Diagnosis not present

## 2019-09-19 DIAGNOSIS — Z791 Long term (current) use of non-steroidal anti-inflammatories (NSAID): Secondary | ICD-10-CM | POA: Diagnosis not present

## 2019-09-19 DIAGNOSIS — Z0000 Encounter for general adult medical examination without abnormal findings: Secondary | ICD-10-CM | POA: Diagnosis not present

## 2019-09-27 ENCOUNTER — Encounter: Payer: Self-pay | Admitting: Cardiology

## 2019-09-28 ENCOUNTER — Ambulatory Visit (INDEPENDENT_AMBULATORY_CARE_PROVIDER_SITE_OTHER): Payer: PPO | Admitting: Cardiology

## 2019-09-28 ENCOUNTER — Other Ambulatory Visit: Payer: Self-pay

## 2019-09-28 ENCOUNTER — Encounter: Payer: Self-pay | Admitting: Cardiology

## 2019-09-28 VITALS — BP 122/68 | HR 54 | Ht 63.0 in | Wt 120.8 lb

## 2019-09-28 DIAGNOSIS — I77810 Thoracic aortic ectasia: Secondary | ICD-10-CM | POA: Diagnosis not present

## 2019-09-28 DIAGNOSIS — I10 Essential (primary) hypertension: Secondary | ICD-10-CM

## 2019-09-28 NOTE — Progress Notes (Signed)
[facility]. [address] [place], [place]  [address] Phone: [phone] Fax:  [phone]  Date:  09/28/2019   ID:  Sandra Tapia 06/02/1946, MRN [id number]  PCP:  Cari Caraway, MD   History of Present Illness: Sandra Tapia is a 73 y.o. female with aortic root of 4.1 -> 4.3 -> 4.35-->4.2 cm ascending aorta. She also has mild to moderate valvular disease. Prior MRI of chest demonstrated no evidence of coarctation. Normal ejection fraction.   Prior LDL cholesterol, she is on simvastatin, was under good control just above 100. She is also a recent breast cancer survivor with XRT/lumpectomy. No chemotherapy other than tamoxifen.  Prior exercise treadmill test reassuring. This was performed because of increasing dyspnea on exertion. She is no longer complaining of this symptom.  She will occasionally feel fluttering sensation, fleeting, periodic. No associated chest pain, shortness of breath. No syncope.  08/22/2018- sometimes feels some dizziness at times, some fatigue.  No chest pain, no syncope, no orthopnea, no PND.  09/28/2019- here for follow-up of dilated aortic root.  I have seen Dr. Gustavus Bryant note, bronchiectasis.  Overall doing well with as needed Mucinex.  No chest pain fevers chills nausea vomiting syncope.  Aortic root 4.1 cm on CT scan.   Wt Readings from Last 3 Encounters:  09/28/19 120 lb 12.8 oz (54.8 kg)  08/29/19 124 lb (56.2 kg)  07/26/19 123 lb (55.8 kg)     Past Medical History:  Diagnosis Date  . Aneurysm of aorta ([place]) 8/09   4.1cm ascending aortic aneurysm on breast MRI  . Basal cell cancer   . Cancer ([place]) 07/2008   breast cancer(rt)  . Degenerative disc disease    neck  . Elevated cholesterol   . Fibroid   . Hypertension   . Infertility, female   . Spinal arthritis     Past Surgical History:  Procedure Laterality Date  . ANKLE SURGERY    . BREAST SURGERY  8/09   (Rt)Lumpectomy,sentinel node  . EYE SURGERY    .  MASTECTOMY, PARTIAL Right 07/31/08   right node Bx (neg)  . RETINAL DETACHMENT SURGERY  june 28th 2012  . TONSILLECTOMY AND ADENOIDECTOMY      Current Outpatient Medications  Medication Sig Dispense Refill  . ALPHAGAN P 0.1 % SOLN Place 1 drop into the left eye 2 (two) times daily.     Marland Kitchen atorvastatin (LIPITOR) 40 MG tablet Take 40 mg by mouth daily.    . Azelastine HCl 0.15 % SOLN Place 1 spray into the nose as needed.  5  . Biotin 10 MG CAPS Take 1 tablet by mouth daily. 1 tablet    . Calcium Carbonate Antacid (TUMS ULTRA 1000 PO) Take 3,000 mg by mouth daily.     . diclofenac (VOLTAREN) 75 MG EC tablet Take 1 tablet (75 mg total) by mouth 2 (two) times daily. 60 tablet 2  . dorzolamide-timolol (COSOPT) 22.3-6.8 MG/ML ophthalmic solution Place 1 drop into the left eye 2 (two) times daily.  12  . fexofenadine (ALLEGRA) 180 MG tablet Take 180 mg by mouth daily.    . fish oil-omega-3 fatty acids 1000 MG capsule Take 3 g by mouth daily.     . hydrochlorothiazide (HYDRODIURIL) 12.5 MG tablet Take 6.25 mg by mouth daily.    Marland Kitchen ketorolac (ACULAR) 0.4 % SOLN Apply 1 drop to eye 4 (four) times daily.    . Multiple Vitamin (MULTIVITAMIN) tablet Take 1  tablet by mouth daily.    Marland Kitchen omeprazole (PRILOSEC) 20 MG capsule Take 20 mg by mouth daily.    . pilocarpine (PILOCAR) 2 % ophthalmic solution Apply to eye as needed.     . prednisoLONE acetate (PRED FORTE) 1 % ophthalmic suspension Place 1 drop into the left eye daily.    . tazarotene (AVAGE) 0.1 % cream Apply 1 application topically at bedtime.    Marland Kitchen telmisartan (MICARDIS) 40 MG tablet Take 1 tablet by mouth once daily 90 tablet 0  . traMADol (ULTRAM) 50 MG tablet Take 1 tablet (50 mg total) by mouth 2 (two) times daily. 60 tablet 5   No current facility-administered medications for this visit.     Allergies:    Allergies  Allergen Reactions  . Penicillins     Nausea & stomach pains  . Adhesive [Tape] Rash    Gel grounding pads    Social  History:  The patient  reports that she has never smoked. She has never used smokeless tobacco. She reports current alcohol use of about 7.0 standard drinks of alcohol per week. She reports that she does not use drugs.   Family History  Problem Relation Age of Onset  . Hypertension Father   . Macular degeneration Father     ROS:  Please see the history of present illness.      PHYSICAL EXAM: VS:  BP 122/68   Pulse (!) 54   Ht 5\' 3"  (1.6 m)   Wt 120 lb 12.8 oz (54.8 kg)   SpO2 98%   BMI 21.40 kg/m  GEN: Thin, in no acute distress  HEENT: normal  Neck: no JVD, slightly accentuated pulsatility to her carotids, no bruits, or masses Cardiac: RRR;soft diastolic murmurs, no rubs, or gallops,no edema  Respiratory:  clear to auscultation bilaterally, normal work of breathing GI: soft, nontender, nondistended, + BS MS: no deformity or atrophy  Skin: warm and dry, no rash Neuro:  Alert and Oriented x 3, Strength and sensation are intact Psych: euthymic mood, full affect   EKG:  Today 09/28/2019-sinus bradycardia 54 with no other abnormalities personally reviewed 08/22/2018-sinus bradycardia 58 with no other abnormalities.  08/18/16-sinus rhythm 63 with nonspecific ST-T wave changes, no change from prior personally viewed-, 08/19/15-sinus rhythm, 64, nonspecific ST-T wave changes-08/17/14-sinus rhythm, no other abnormalities    ECHO: 08/18/16 . - Aorta: Ascending aortic diameter: 60mm (S). - Ascending aorta: The ascending aorta was mildly dilated.  Echocardiogram 08/22/2018  -Ascending aortic root 44 mm  -Mild to moderate aortic regurgitation.  -Normal EF  CT chest 01/24/19: 1. Spectrum of findings within the right lung including bronchiectasis airspace consolidation and tree-in-bud nodularity identified. Imaging findings are favored to represent sequelae of chronic, atypical infection such as MAI. Recurrent aspiration is also a differential consideration. Recommend follow-up imaging after  appropriate therapy with repeat CT of the chest to assess for any temporal change in the appearance of the lungs. 2. Aortic atherosclerosis and mild aneurysmal dilatation of the ascending thoracic aorta 4.1cm. Recommend annual imaging followup by CTA or MRA. This recommendation follows 2010 ACCF/AHA/AATS/ACR/ASA/SCA/SCAI/SIR/STS/SVM Guidelines for the Diagnosis and Management of Patients with Thoracic Aortic Disease. Circulation. 2010; [id number]. Aortic aneurysm NOS (ICD10-I71.9). Aortic Atherosclerosis (ICD10-I70.0).  ASSESSMENT AND PLAN:  1. Dilated aortic root-currently 42-44 mm. once again Stable. It has been quite stable for some time. We will continue to monitor.    Avoid Cipro.  Stable.  No changes.  In 2 years, we will check echocardiogram. 2. Hyperlipidemia -  continue atorvastatin LDL 73.  No changes made. 3. Essential hypertension- stable.  Reviewed. 4. Palpitations-occasional flutter-like sensation, no high-risk symptoms such as syncope.  Overall stable.  No complaints today. 5. I would like to see her back in 2 years, check echocardiogram at that time.  Signed, Candee Furbish, MD [facility]  09/28/2019 11:15 AM

## 2019-09-28 NOTE — Patient Instructions (Signed)
Medication Instructions:  The current medical regimen is effective;  continue present plan and medications.  If you need a refill on your cardiac medications before your next appointment, please call your pharmacy.   Testing/Procedures: Your physician has requested that you have an echocardiogram. Echocardiography is a painless test that uses sound waves to create images of your heart. It provides your doctor with information about the size and shape of your heart and how well your heart's chambers and valves are working. This procedure takes approximately one hour. There are no restrictions for this procedure.  Follow-Up: At [facility], you and your health needs are our priority.  As part of our continuing mission to provide you with exceptional heart care, we have created designated Provider Care Teams.  These Care Teams include your primary Cardiologist (physician) and Advanced Practice Providers (APPs -  Physician Assistants and Nurse Practitioners) who all work together to provide you with the care you need, when you need it. You will need a follow up appointment in 2 years.  Please call our office 2 months in advance to schedule this appointment.  You may see Candee Furbish, MD or one of the following Advanced Practice Providers on your designated Care Team:   Truitt Merle, NP Cecilie Kicks, NP . Kathyrn Drown, NP  Thank you for choosing [facility]!!

## 2019-10-02 DIAGNOSIS — J479 Bronchiectasis, uncomplicated: Secondary | ICD-10-CM | POA: Diagnosis not present

## 2019-10-02 DIAGNOSIS — H409 Unspecified glaucoma: Secondary | ICD-10-CM | POA: Diagnosis not present

## 2019-10-02 DIAGNOSIS — I10 Essential (primary) hypertension: Secondary | ICD-10-CM | POA: Diagnosis not present

## 2019-10-02 DIAGNOSIS — M150 Primary generalized (osteo)arthritis: Secondary | ICD-10-CM | POA: Diagnosis not present

## 2019-10-02 DIAGNOSIS — E782 Mixed hyperlipidemia: Secondary | ICD-10-CM | POA: Diagnosis not present

## 2019-10-02 DIAGNOSIS — Z853 Personal history of malignant neoplasm of breast: Secondary | ICD-10-CM | POA: Diagnosis not present

## 2019-10-05 DIAGNOSIS — Z853 Personal history of malignant neoplasm of breast: Secondary | ICD-10-CM | POA: Diagnosis not present

## 2019-10-05 DIAGNOSIS — Z1231 Encounter for screening mammogram for malignant neoplasm of breast: Secondary | ICD-10-CM | POA: Diagnosis not present

## 2019-11-01 DIAGNOSIS — H409 Unspecified glaucoma: Secondary | ICD-10-CM | POA: Diagnosis not present

## 2019-11-01 DIAGNOSIS — J479 Bronchiectasis, uncomplicated: Secondary | ICD-10-CM | POA: Diagnosis not present

## 2019-11-01 DIAGNOSIS — E782 Mixed hyperlipidemia: Secondary | ICD-10-CM | POA: Diagnosis not present

## 2019-11-01 DIAGNOSIS — I10 Essential (primary) hypertension: Secondary | ICD-10-CM | POA: Diagnosis not present

## 2019-11-01 DIAGNOSIS — M150 Primary generalized (osteo)arthritis: Secondary | ICD-10-CM | POA: Diagnosis not present

## 2019-11-01 DIAGNOSIS — Z853 Personal history of malignant neoplasm of breast: Secondary | ICD-10-CM | POA: Diagnosis not present

## 2019-11-16 ENCOUNTER — Ambulatory Visit: Payer: PPO | Admitting: Physical Medicine & Rehabilitation

## 2019-12-07 ENCOUNTER — Other Ambulatory Visit: Payer: Self-pay | Admitting: Physical Medicine & Rehabilitation

## 2019-12-08 DIAGNOSIS — H35352 Cystoid macular degeneration, left eye: Secondary | ICD-10-CM | POA: Diagnosis not present

## 2019-12-08 DIAGNOSIS — H353132 Nonexudative age-related macular degeneration, bilateral, intermediate dry stage: Secondary | ICD-10-CM | POA: Diagnosis not present

## 2019-12-08 DIAGNOSIS — H33002 Unspecified retinal detachment with retinal break, left eye: Secondary | ICD-10-CM | POA: Diagnosis not present

## 2019-12-08 DIAGNOSIS — H43391 Other vitreous opacities, right eye: Secondary | ICD-10-CM | POA: Diagnosis not present

## 2019-12-08 DIAGNOSIS — H20022 Recurrent acute iridocyclitis, left eye: Secondary | ICD-10-CM | POA: Diagnosis not present

## 2019-12-08 DIAGNOSIS — H35433 Paving stone degeneration of retina, bilateral: Secondary | ICD-10-CM | POA: Diagnosis not present

## 2019-12-08 DIAGNOSIS — H35431 Paving stone degeneration of retina, right eye: Secondary | ICD-10-CM | POA: Diagnosis not present

## 2019-12-08 NOTE — Telephone Encounter (Signed)
Refill request for Tramadol last filled 11/06/2019 next appt 01/04/2020

## 2020-01-04 ENCOUNTER — Encounter: Payer: PPO | Attending: Physical Medicine & Rehabilitation | Admitting: Physical Medicine & Rehabilitation

## 2020-01-04 ENCOUNTER — Encounter: Payer: Self-pay | Admitting: Physical Medicine & Rehabilitation

## 2020-01-04 ENCOUNTER — Other Ambulatory Visit: Payer: Self-pay

## 2020-01-04 VITALS — BP 132/75 | HR 53 | Temp 97.7°F | Ht 63.0 in | Wt 124.0 lb

## 2020-01-04 DIAGNOSIS — Z79891 Long term (current) use of opiate analgesic: Secondary | ICD-10-CM | POA: Insufficient documentation

## 2020-01-04 DIAGNOSIS — Z5181 Encounter for therapeutic drug level monitoring: Secondary | ICD-10-CM | POA: Insufficient documentation

## 2020-01-04 DIAGNOSIS — G894 Chronic pain syndrome: Secondary | ICD-10-CM | POA: Diagnosis not present

## 2020-01-04 DIAGNOSIS — M533 Sacrococcygeal disorders, not elsewhere classified: Secondary | ICD-10-CM

## 2020-01-04 MED ORDER — TRAMADOL HCL 50 MG PO TABS: 50.0000 mg | ORAL_TABLET | Freq: Two times a day (BID) | ORAL | 5 refills | Status: DC

## 2020-01-04 MED ORDER — DICLOFENAC SODIUM 75 MG PO TBEC: 75.0000 mg | DELAYED_RELEASE_TABLET | Freq: Two times a day (BID) | ORAL | 2 refills | Status: AC

## 2020-01-04 NOTE — Patient Instructions (Signed)
Please call to set up sacroiliac injections - bilateral

## 2020-01-04 NOTE — Progress Notes (Signed)
Subjective:    Patient ID: Sandra Tapia, female    DOB: Aug 04, 1946, 74 y.o.   MRN: [id number] 08/28/2019 RightL5 dorsal ramus., Right L4 and Right L3 medial branch radio frequency neurotomy under fluoroscopic guidance  07/24/2019 Left L5 dorsal ramus., left L4 and left L3 medial branch radio frequency neurotomy under fluoroscopic guidance HPI Walks outside ~60min  Prior RF lasted ~1 yr 06/02/18, 05/02/18   Noticing Left > Right buttocks pain worsened when crossing the legs Pain mild to moderate  Numbness in LLE chronic Pain Inventory Average Pain 5 Pain Right Now 7 My pain is sharp, dull and aching  In the last 24 hours, has pain interfered with the following? General activity 6 Relation with others 7 Enjoyment of life 7 What TIME of day is your pain at its worst? daytime Sleep (in general) Good  Pain is worse with: walking, standing and some activites Pain improves with: rest, heat/ice and medication Relief from Meds: 6  Mobility walk without assistance how many minutes can you walk? 30 ability to climb steps?  yes do you drive?  yes transfers alone Do you have any goals in this area?  yes  Function retired I need assistance with the following:  meal prep, household duties and shopping  Neuro/Psych weakness tingling trouble walking  Prior Studies Any changes since last visit?  no  Physicians involved in your care Any changes since last visit?  no   Family History  Problem Relation Age of Onset  . Hypertension Father   . Macular degeneration Father    Social History   Socioeconomic History  . Marital status: Married    Spouse name: Not on file  . Number of children: Not on file  . Years of education: Not on file  . Highest education level: Not on file  Occupational History  . Not on file  Tobacco Use  . Smoking status: Never Smoker  . Smokeless tobacco: Never Used  Substance and Sexual Activity  . Alcohol use: Yes    Alcohol/week: 7.0  standard drinks    Types: 7 drink(s) per week    Comment: 6-7 glasses of wine a week  . Drug use: No  . Sexual activity: Yes    Partners: Male    Birth control/protection: Post-menopausal  Other Topics Concern  . Not on file  Social History Narrative  . Not on file   Social Determinants of Health   Financial Resource Strain:   . Difficulty of Paying Living Expenses: Not on file  Food Insecurity:   . Worried About Charity fundraiser in the Last Year: Not on file  . Ran Out of Food in the Last Year: Not on file  Transportation Needs:   . Lack of Transportation (Medical): Not on file  . Lack of Transportation (Non-Medical): Not on file  Physical Activity:   . Days of Exercise per Week: Not on file  . Minutes of Exercise per Session: Not on file  Stress:   . Feeling of Stress : Not on file  Social Connections:   . Frequency of Communication with Friends and Family: Not on file  . Frequency of Social Gatherings with Friends and Family: Not on file  . Attends Religious Services: Not on file  . Active Member of Clubs or Organizations: Not on file  . Attends Archivist Meetings: Not on file  . Marital Status: Not on file   Past Surgical History:  Procedure Laterality Date  . ANKLE  SURGERY    . BREAST SURGERY  8/09   (Rt)Lumpectomy,sentinel node  . EYE SURGERY    . MASTECTOMY, PARTIAL Right 07/31/08   right node Bx (neg)  . RETINAL DETACHMENT SURGERY  june 28th 2012  . TONSILLECTOMY AND ADENOIDECTOMY     Past Medical History:  Diagnosis Date  . Aneurysm of aorta ([place]) 8/09   4.1cm ascending aortic aneurysm on breast MRI  . Basal cell cancer   . Cancer ([place]) 07/2008   breast cancer(rt)  . Degenerative disc disease    neck  . Elevated cholesterol   . Fibroid   . Hypertension   . Infertility, female   . Spinal arthritis    Temp 97.7 F (36.5 C)   Ht 5\' 3"  (1.6 m)   Wt 124 lb (56.2 kg)   BMI 21.97 kg/m   Opioid Risk Score:   Fall Risk Score:   `1  Depression screen PHQ 2/9  Depression screen [facility] 2/9 05/02/2018 03/12/2016 02/13/2016 07/22/2015  Decreased Interest 0 0 0 0  Down, Depressed, Hopeless 0 0 0 0  PHQ - 2 Score 0 0 0 0    Review of Systems  Constitutional: Negative.   HENT: Negative.   Eyes: Negative.   Respiratory: Negative.   Cardiovascular: Negative.   Gastrointestinal: Negative.   Endocrine: Negative.   Genitourinary: Negative.   Musculoskeletal: Positive for arthralgias and gait problem.  Skin: Negative.   Allergic/Immunologic: Negative.   Neurological: Positive for weakness.       Tingling   Hematological: Negative.   Psychiatric/Behavioral: Negative.        Objective:   Physical Exam Constitutional:      Appearance: Normal appearance.  HENT:     Head: Normocephalic and atraumatic.  Eyes:     Extraocular Movements: Extraocular movements intact.     Conjunctiva/sclera: Conjunctivae normal.     Pupils: Pupils are equal, round, and reactive to light.  Musculoskeletal:     Lumbar back: Deformity, tenderness and bony tenderness present. Negative right straight leg raise test and negative left straight leg raise test. Scoliosis present.     Comments: Levo convex scoliosis, good lumbar flexion extension and lateral bending bilaterally.  Skin:    General: Skin is warm and dry.  Neurological:     General: No focal deficit present.     Mental Status: She is alert and oriented to person, place, and time.     Motor: Motor function is intact.     Coordination: Coordination is intact.     Gait: Gait is intact.     Comments: Motor strength is 5/5 bilateral hip flexor knee extensor ankle dorsiflexor bilaterally. Gait is without evidence of toe drag or knee instability.  Psychiatric:        Mood and Affect: Mood normal.     Tenderness palpation bilateral PSIS area       Assessment & Plan:  Lumbar spondylosis without myelopathy in the setting of lumbar scoliosis.  In addition patient has lumbar spinal  stenosis causing some chronic radicular symptoms in the L4 and L5 dermatomal distribution on the left side. The patient's current symptomatology suggests a sacroiliac disorder.  She did have bilateral sacroiliac injections in February 2019.  If this worsens she may need to repeat. At this point I do not think she needs any repeat radiofrequency neurotomy of the lumbar area. She will continue her tramadol 50 mg twice daily Urine drug screen today Reviewed PDMP appropriate. MD follow-up in 6 months.  Patient to call for sacroiliac injections if needed.

## 2020-01-08 LAB — TOXASSURE SELECT,+ANTIDEPR,UR

## 2020-01-11 ENCOUNTER — Telehealth: Payer: Self-pay | Admitting: *Deleted

## 2020-01-11 NOTE — Telephone Encounter (Signed)
Urine drug screen for this encounter is consistent for prescribed medication 

## 2020-01-19 DIAGNOSIS — R198 Other specified symptoms and signs involving the digestive system and abdomen: Secondary | ICD-10-CM | POA: Diagnosis not present

## 2020-01-19 DIAGNOSIS — K602 Anal fissure, unspecified: Secondary | ICD-10-CM | POA: Diagnosis not present

## 2020-01-20 IMAGING — CT CT CHEST W/ CM
2 of 3 series · 15 of 36 positions shown, 18 images · IV contrast (ISOVUE 300)
Comparison: None

CLINICAL DATA: Persistent cough.  Remote history of bronchitis.

EXAM:
CT CHEST WITH CONTRAST
TECHNIQUE: Multidetector CT imaging of the chest was performed during
intravenous contrast administration.
CONTRAST:  80mL VQGQ9Y-1FF IOPAMIDOL (VQGQ9Y-1FF) INJECTION 61%

[Series 2: thorax · axial · 0.63mm/px · z∈[+1417,+1665]mm · 12 of 146 slices shown, 15 images]
[im 11/146  mediastinal]
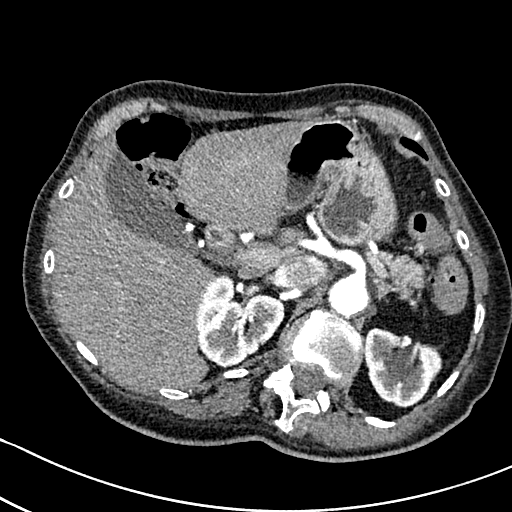
[im 11/146  lung]
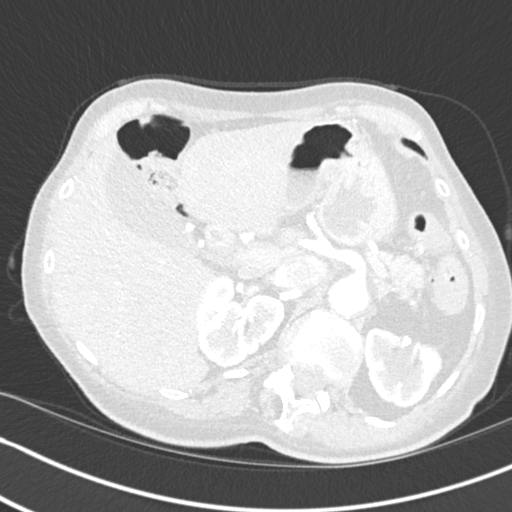
[im 22/146  lung]
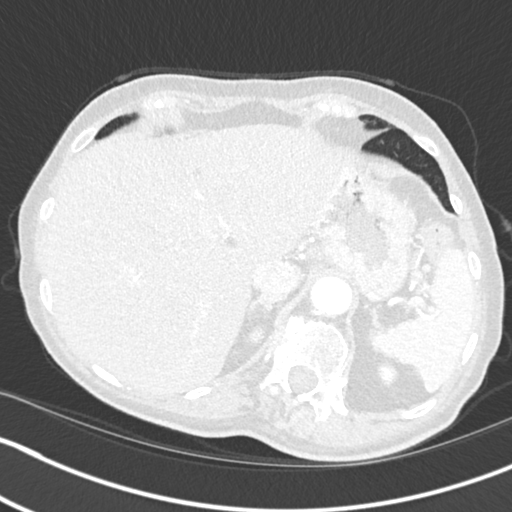
[im 33/146  lung]
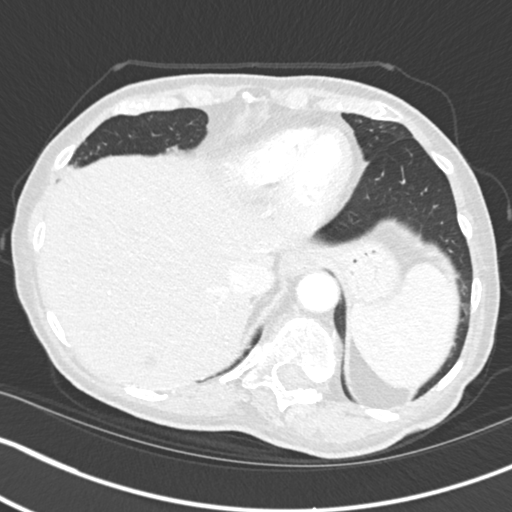
[im 43/146  lung]
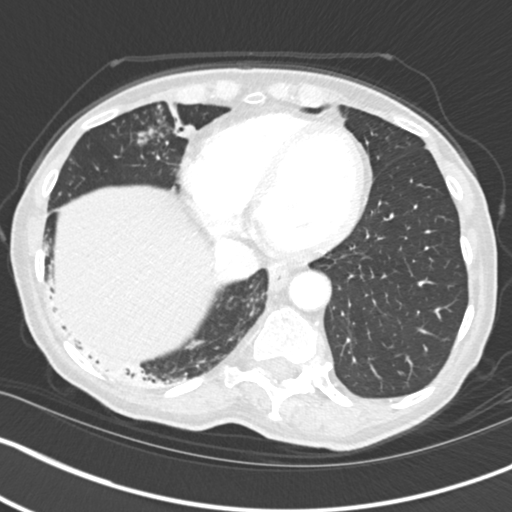
[im 54/146  mediastinal]
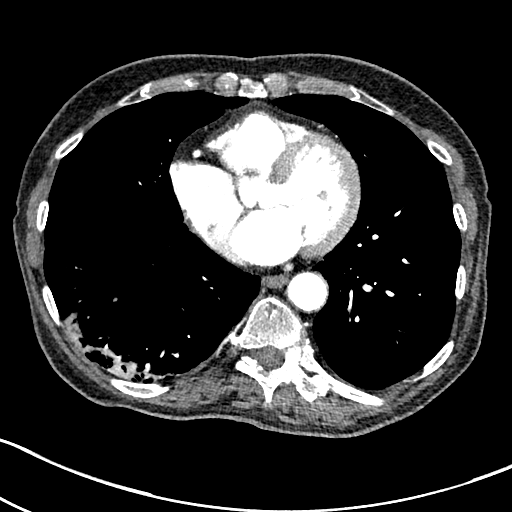
[im 54/146  lung]
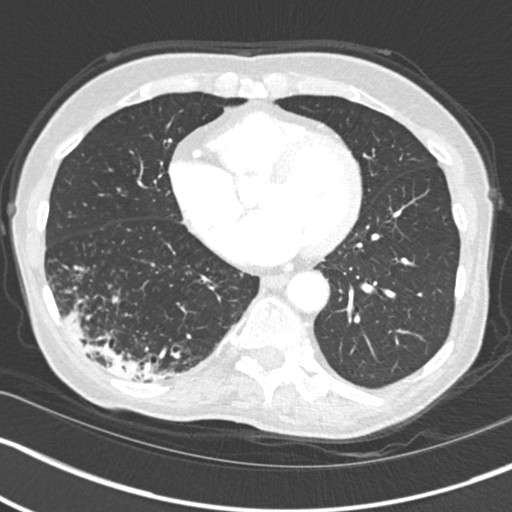
[im 65/146  lung]
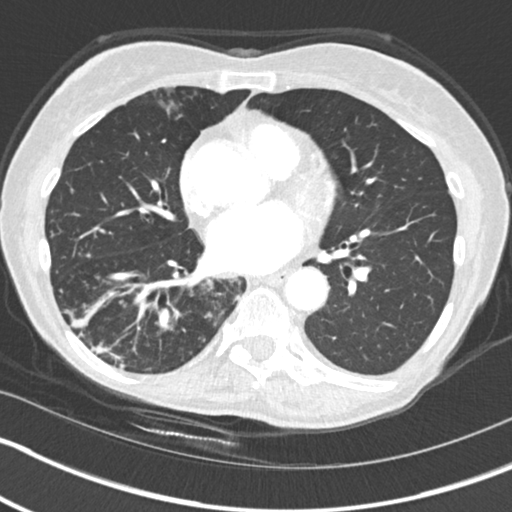
[im 81/146  lung]
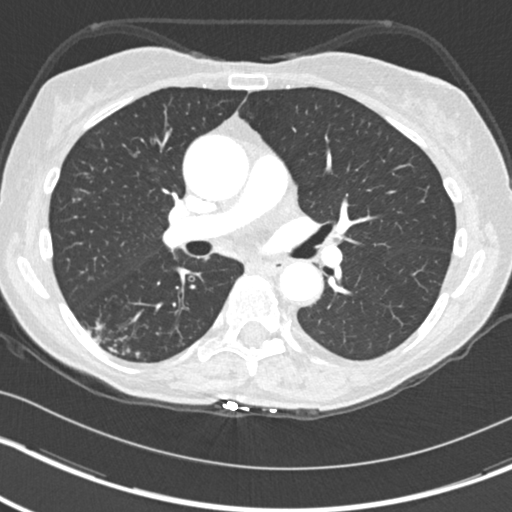
[im 92/146  lung]
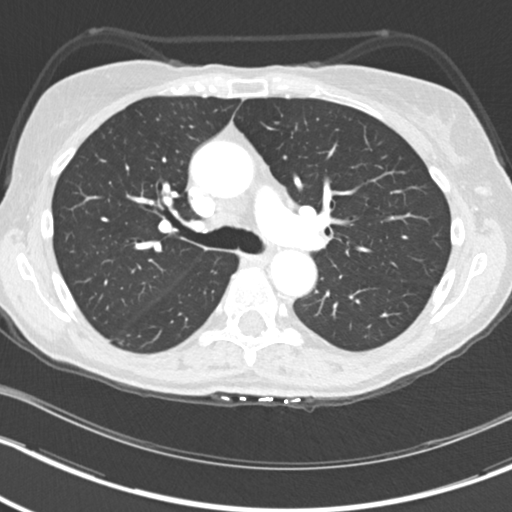
[im 103/146  mediastinal]
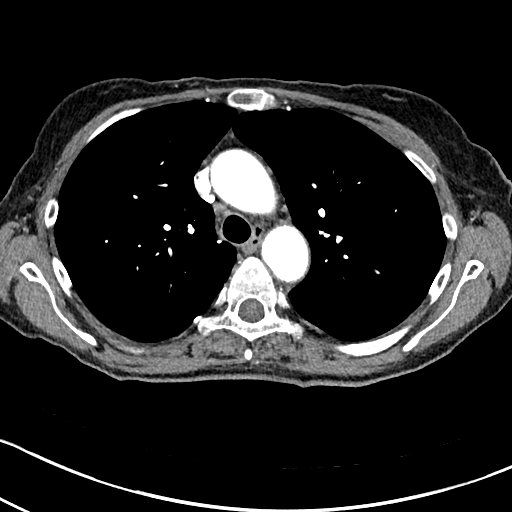
[im 103/146  lung]
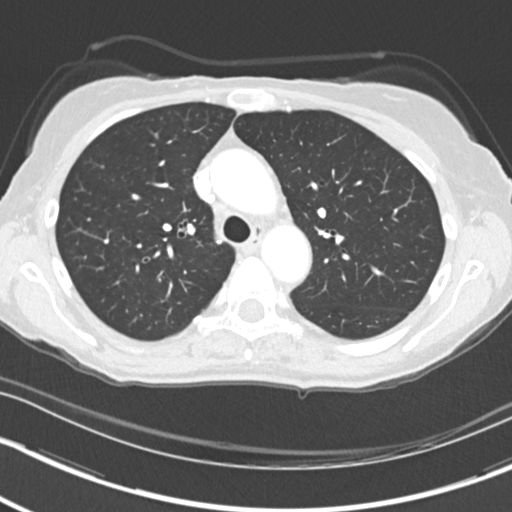
[im 113/146  lung]
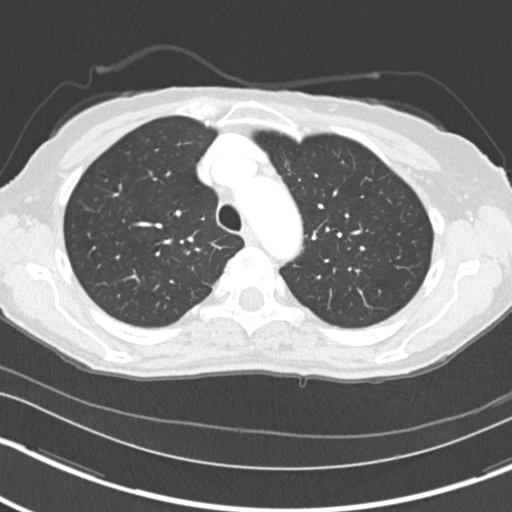
[im 124/146  lung]
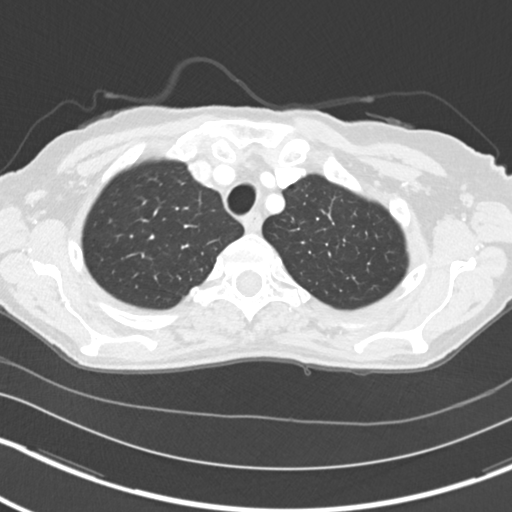
[im 135/146  lung]
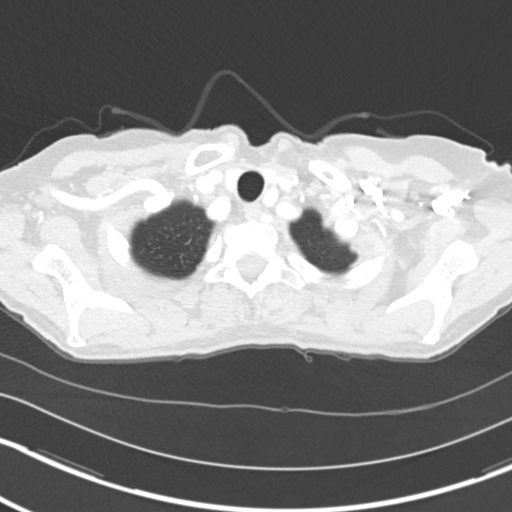

[Series 5: coronal · coronal · 0.59mm/px · 3 of 117 slices shown]
[im 24/117  lung]
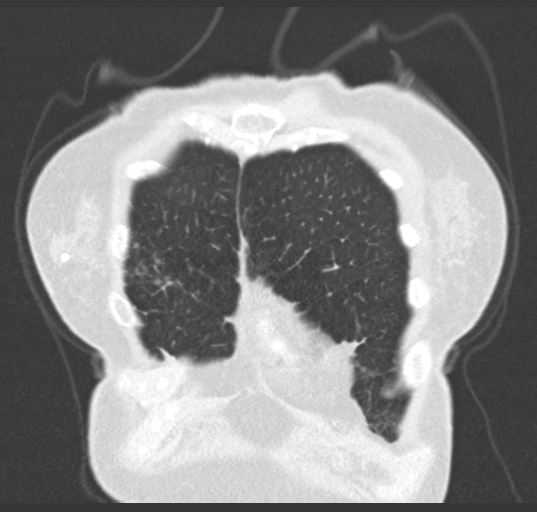
[im 47/117  lung]
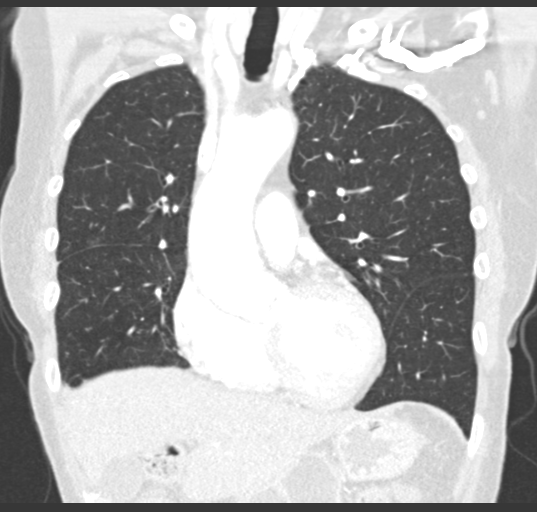
[im 70/117  lung]
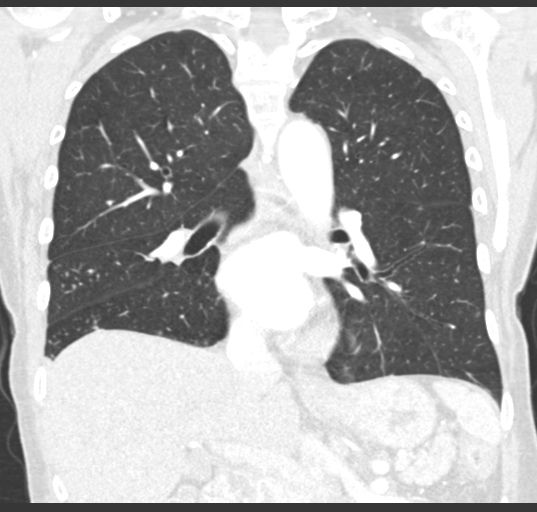

[15 of 36 positions shown; findings below may reference images not displayed]

FINDINGS: Cardiovascular: Ace in heart size normal. No pericardial effusion.
Ascending thoracic aorta measures 4.1 cm. Mild aortic
atherosclerosis.

Mediastinum/Nodes: Normal appearance of the thyroid gland. The
trachea appears patent and is midline. Normal appearance of the
esophagus. No supraclavicular or axillary adenopathy. No mediastinal
or hilar adenopathy identified.

Lungs/Pleura: There is peripheral and lower lobe predominant
airspace consolidation within the posterior and lateral right lower
lobe, image 97/3. Numerous tree-in-bud nodules are scattered
throughout the right middle lobe and right lower lobe. Mild
bronchiectasis within the medial right middle lobe is identified.
Right lower lobe bronchiectasis also noted. The within the anterior
basal right upper lobe there is clustered areas of ground-glass and
tree-in-bud nodules noted.

Upper Abdomen: No acute abnormality. Small cyst in right lobe of
liver noted.

Musculoskeletal: There is a marked scoliosis deformity involving the
lower thoracic and lumbar spine. Associated multi level degenerative
disc disease.
IMPRESSION: 1. Spectrum of findings within the right lung including
bronchiectasis airspace consolidation and tree-in-bud nodularity
identified. Imaging findings are favored to represent sequelae of
chronic, atypical infection such as AFFANDI. Recurrent aspiration is
also a differential consideration. Recommend follow-up imaging after
appropriate therapy with repeat CT of the chest to assess for any
temporal change in the appearance of the lungs.
2. Aortic atherosclerosis and mild aneurysmal dilatation of the
ascending thoracic aorta. Recommend annual imaging followup by CTA
or MRA. This recommendation follows 2707
ACCF/AHA/AATS/ACR/ASA/SCA/AXEL/[place]/INHWA/RUOTOLO Guidelines for the
Diagnosis and Management of Patients with Thoracic Aortic Disease.
Circulation. 2707; 121: E266-e369. Aortic aneurysm NOS
(XCE96-IOY.C). Aortic Atherosclerosis (XCE96-NEI.I).

## 2020-01-30 DIAGNOSIS — M150 Primary generalized (osteo)arthritis: Secondary | ICD-10-CM | POA: Diagnosis not present

## 2020-01-30 DIAGNOSIS — J479 Bronchiectasis, uncomplicated: Secondary | ICD-10-CM | POA: Diagnosis not present

## 2020-01-30 DIAGNOSIS — Z853 Personal history of malignant neoplasm of breast: Secondary | ICD-10-CM | POA: Diagnosis not present

## 2020-01-30 DIAGNOSIS — I10 Essential (primary) hypertension: Secondary | ICD-10-CM | POA: Diagnosis not present

## 2020-01-30 DIAGNOSIS — H409 Unspecified glaucoma: Secondary | ICD-10-CM | POA: Diagnosis not present

## 2020-01-30 DIAGNOSIS — E782 Mixed hyperlipidemia: Secondary | ICD-10-CM | POA: Diagnosis not present

## 2020-02-27 DIAGNOSIS — H409 Unspecified glaucoma: Secondary | ICD-10-CM | POA: Diagnosis not present

## 2020-02-27 DIAGNOSIS — M150 Primary generalized (osteo)arthritis: Secondary | ICD-10-CM | POA: Diagnosis not present

## 2020-02-27 DIAGNOSIS — J479 Bronchiectasis, uncomplicated: Secondary | ICD-10-CM | POA: Diagnosis not present

## 2020-02-27 DIAGNOSIS — Z853 Personal history of malignant neoplasm of breast: Secondary | ICD-10-CM | POA: Diagnosis not present

## 2020-02-27 DIAGNOSIS — E782 Mixed hyperlipidemia: Secondary | ICD-10-CM | POA: Diagnosis not present

## 2020-02-27 DIAGNOSIS — I10 Essential (primary) hypertension: Secondary | ICD-10-CM | POA: Diagnosis not present

## 2020-02-28 DIAGNOSIS — K219 Gastro-esophageal reflux disease without esophagitis: Secondary | ICD-10-CM | POA: Diagnosis not present

## 2020-02-28 DIAGNOSIS — M150 Primary generalized (osteo)arthritis: Secondary | ICD-10-CM | POA: Diagnosis not present

## 2020-02-28 DIAGNOSIS — E782 Mixed hyperlipidemia: Secondary | ICD-10-CM | POA: Diagnosis not present

## 2020-02-28 DIAGNOSIS — Z791 Long term (current) use of non-steroidal anti-inflammatories (NSAID): Secondary | ICD-10-CM | POA: Diagnosis not present

## 2020-02-28 DIAGNOSIS — Z0000 Encounter for general adult medical examination without abnormal findings: Secondary | ICD-10-CM | POA: Diagnosis not present

## 2020-02-28 DIAGNOSIS — I10 Essential (primary) hypertension: Secondary | ICD-10-CM | POA: Diagnosis not present

## 2020-02-28 DIAGNOSIS — J479 Bronchiectasis, uncomplicated: Secondary | ICD-10-CM | POA: Diagnosis not present

## 2020-02-28 DIAGNOSIS — R7309 Other abnormal glucose: Secondary | ICD-10-CM | POA: Diagnosis not present

## 2020-02-28 DIAGNOSIS — I712 Thoracic aortic aneurysm, without rupture: Secondary | ICD-10-CM | POA: Diagnosis not present

## 2020-02-28 DIAGNOSIS — E559 Vitamin D deficiency, unspecified: Secondary | ICD-10-CM | POA: Diagnosis not present

## 2020-02-28 DIAGNOSIS — M85852 Other specified disorders of bone density and structure, left thigh: Secondary | ICD-10-CM | POA: Diagnosis not present

## 2020-02-28 DIAGNOSIS — I77810 Thoracic aortic ectasia: Secondary | ICD-10-CM | POA: Diagnosis not present

## 2020-03-04 DIAGNOSIS — I10 Essential (primary) hypertension: Secondary | ICD-10-CM | POA: Diagnosis not present

## 2020-03-04 DIAGNOSIS — J479 Bronchiectasis, uncomplicated: Secondary | ICD-10-CM | POA: Diagnosis not present

## 2020-03-04 DIAGNOSIS — R7309 Other abnormal glucose: Secondary | ICD-10-CM | POA: Diagnosis not present

## 2020-03-04 DIAGNOSIS — M545 Low back pain: Secondary | ICD-10-CM | POA: Diagnosis not present

## 2020-03-04 DIAGNOSIS — Z791 Long term (current) use of non-steroidal anti-inflammatories (NSAID): Secondary | ICD-10-CM | POA: Diagnosis not present

## 2020-03-04 DIAGNOSIS — E782 Mixed hyperlipidemia: Secondary | ICD-10-CM | POA: Diagnosis not present

## 2020-03-04 DIAGNOSIS — H3530 Unspecified macular degeneration: Secondary | ICD-10-CM | POA: Diagnosis not present

## 2020-03-04 DIAGNOSIS — H409 Unspecified glaucoma: Secondary | ICD-10-CM | POA: Diagnosis not present

## 2020-03-04 DIAGNOSIS — M150 Primary generalized (osteo)arthritis: Secondary | ICD-10-CM | POA: Diagnosis not present

## 2020-03-04 DIAGNOSIS — K219 Gastro-esophageal reflux disease without esophagitis: Secondary | ICD-10-CM | POA: Diagnosis not present

## 2020-03-04 DIAGNOSIS — E559 Vitamin D deficiency, unspecified: Secondary | ICD-10-CM | POA: Diagnosis not present

## 2020-03-12 ENCOUNTER — Encounter: Payer: PPO | Attending: Physical Medicine & Rehabilitation | Admitting: Physical Medicine & Rehabilitation

## 2020-03-12 ENCOUNTER — Other Ambulatory Visit: Payer: Self-pay

## 2020-03-12 ENCOUNTER — Encounter: Payer: Self-pay | Admitting: Physical Medicine & Rehabilitation

## 2020-03-12 VITALS — BP 164/81 | HR 62 | Temp 97.5°F | Ht 63.0 in | Wt 124.0 lb

## 2020-03-12 DIAGNOSIS — M533 Sacrococcygeal disorders, not elsewhere classified: Secondary | ICD-10-CM | POA: Diagnosis not present

## 2020-03-12 DIAGNOSIS — Z5181 Encounter for therapeutic drug level monitoring: Secondary | ICD-10-CM | POA: Insufficient documentation

## 2020-03-12 DIAGNOSIS — G894 Chronic pain syndrome: Secondary | ICD-10-CM | POA: Diagnosis not present

## 2020-03-12 DIAGNOSIS — Z79891 Long term (current) use of opiate analgesic: Secondary | ICD-10-CM | POA: Diagnosis not present

## 2020-03-12 NOTE — Progress Notes (Signed)
  PROCEDURE RECORD [facility]   Name: Sandra Tapia DOB:[birth year] MRN: [id number]  Date:03/12/2020  Physician: Alysia Penna, MD    Nurse/CMA: Marrian Bells, CMA   Allergies:  Allergies  Allergen Reactions  . Penicillins     Nausea & stomach pains  . Adhesive [Tape] Rash    Gel grounding pads    Consent Signed: Yes.    Is patient diabetic? No.  CBG today?   Pregnant: No. LMP: No LMP recorded. Patient is postmenopausal. (age 29-55)  Anticoagulants: no Anti-inflammatory: no Antibiotics: no  Procedure: bilateral sacroiliac steroid injections Position: Prone Start Time: 11:54am    End Time: 12:01pm  Fluoro Time: 26s  RN/CMA Mardy Lucier, CMA Granvil Djordjevic, CMA    Time 11:43am 12:08pm    BP 164/81 134/77    Pulse 62 60    Respirations 14 14    O2 Sat 95 94    S/S 6 6    Pain Level 5/10 1/10     D/C home with husband , [name], D/C instructions reviewed, and sits independently.

## 2020-03-12 NOTE — Patient Instructions (Signed)
Sacroiliac injection was performed today. A combination of a naming medicine plus a cortisone medicine was injected. The injection was done under x-ray guidance. This procedure has been performed to help reduce low back and buttocks pain as well as potentially hip pain. The duration of this injection is variable lasting from hours to  Months. It may repeated if needed. 

## 2020-03-12 NOTE — Progress Notes (Signed)

## 2020-04-12 DIAGNOSIS — H35433 Paving stone degeneration of retina, bilateral: Secondary | ICD-10-CM | POA: Diagnosis not present

## 2020-04-12 DIAGNOSIS — H40052 Ocular hypertension, left eye: Secondary | ICD-10-CM | POA: Diagnosis not present

## 2020-04-12 DIAGNOSIS — H33002 Unspecified retinal detachment with retinal break, left eye: Secondary | ICD-10-CM | POA: Diagnosis not present

## 2020-04-12 DIAGNOSIS — H43391 Other vitreous opacities, right eye: Secondary | ICD-10-CM | POA: Diagnosis not present

## 2020-04-12 DIAGNOSIS — H35431 Paving stone degeneration of retina, right eye: Secondary | ICD-10-CM | POA: Diagnosis not present

## 2020-04-12 DIAGNOSIS — H35352 Cystoid macular degeneration, left eye: Secondary | ICD-10-CM | POA: Diagnosis not present

## 2020-04-12 DIAGNOSIS — H20022 Recurrent acute iridocyclitis, left eye: Secondary | ICD-10-CM | POA: Diagnosis not present

## 2020-04-12 DIAGNOSIS — H353132 Nonexudative age-related macular degeneration, bilateral, intermediate dry stage: Secondary | ICD-10-CM | POA: Diagnosis not present

## 2020-04-16 DIAGNOSIS — L820 Inflamed seborrheic keratosis: Secondary | ICD-10-CM | POA: Diagnosis not present

## 2020-04-23 ENCOUNTER — Ambulatory Visit: Payer: PPO | Admitting: Physical Medicine & Rehabilitation

## 2020-04-25 ENCOUNTER — Encounter: Payer: PPO | Attending: Physical Medicine & Rehabilitation | Admitting: Physical Medicine & Rehabilitation

## 2020-04-25 ENCOUNTER — Other Ambulatory Visit: Payer: Self-pay

## 2020-04-25 ENCOUNTER — Encounter: Payer: Self-pay | Admitting: Physical Medicine & Rehabilitation

## 2020-04-25 VITALS — Ht 63.0 in | Wt 124.0 lb

## 2020-04-25 DIAGNOSIS — Z5181 Encounter for therapeutic drug level monitoring: Secondary | ICD-10-CM | POA: Insufficient documentation

## 2020-04-25 DIAGNOSIS — M47817 Spondylosis without myelopathy or radiculopathy, lumbosacral region: Secondary | ICD-10-CM

## 2020-04-25 DIAGNOSIS — Z79891 Long term (current) use of opiate analgesic: Secondary | ICD-10-CM | POA: Insufficient documentation

## 2020-04-25 DIAGNOSIS — M533 Sacrococcygeal disorders, not elsewhere classified: Secondary | ICD-10-CM | POA: Diagnosis not present

## 2020-04-25 DIAGNOSIS — G894 Chronic pain syndrome: Secondary | ICD-10-CM | POA: Insufficient documentation

## 2020-04-25 NOTE — Progress Notes (Signed)
Subjective:  WebEx visit, explained to patient that majority of exam cannot be performed since this is not in person  Patient ID: Sandra Tapia, female    DOB: 1946-05-17, 74 y.o.   MRN: [id number]  08/28/2019 RightL5 dorsal ramus., Right L4 and Right L3 medial branch radio frequency neurotomy under fluoroscopic guidance  07/24/2019 Left L5 dorsal ramus., left L4 and left L3 medial branch radio frequency neurotomy under fluoroscopic guidance HPI  75 year old female with chronic low back pain she has had both right and left L5 dorsal ramus L3 and L4 medial branch radiofrequency neurotomies in 2019 and 2020.  The procedures performed in 2019 lasted approximately 1 year.  Patient feels like the procedures performed in 2020 are still effective. She did develop buttocks pain bilaterally starting around Christmas holiday.  She had gradual worsening and underwent evaluation in clinic in January.  Because a gradual worsening she was scheduled for sacroiliac injections bilaterally performed in March 2021.  This was very helpful for her buttock pain. She is walking 35 to 40 minutes/day.  The patient continues to take tramadol 50 mg 1 to 2 tablets/day according to last prescription she should have 2 more refills left Pain Inventory Average Pain 3 Pain Right Now 3 My pain is dull and aching  In the last 24 hours, has pain interfered with the following? General activity 3 Relation with others 3 Enjoyment of life 3 What TIME of day is your pain at its worst? daytime Sleep (in general) Good  Pain is worse with: walking, standing and some activites Pain improves with: rest, heat/ice and medication Relief from Meds: 7  Mobility how many minutes can you walk? 30 ability to climb steps?  yes do you drive?  yes  Function retired I need assistance with the following:  meal prep and shopping  Neuro/Psych weakness tingling trouble walking  Prior Studies Any changes since last visit?   no  Physicians involved in your care Any changes since last visit?  no   Family History  Problem Relation Age of Onset  . Hypertension Father   . Macular degeneration Father    Social History   Socioeconomic History  . Marital status: Married    Spouse name: Not on file  . Number of children: Not on file  . Years of education: Not on file  . Highest education level: Not on file  Occupational History  . Not on file  Tobacco Use  . Smoking status: Never Smoker  . Smokeless tobacco: Never Used  Substance and Sexual Activity  . Alcohol use: Yes    Alcohol/week: 7.0 standard drinks    Types: 7 drink(s) per week    Comment: 6-7 glasses of wine a week  . Drug use: No  . Sexual activity: Yes    Partners: Male    Birth control/protection: Post-menopausal  Other Topics Concern  . Not on file  Social History Narrative  . Not on file   Social Determinants of Health   Financial Resource Strain:   . Difficulty of Paying Living Expenses:   Food Insecurity:   . Worried About Charity fundraiser in the Last Year:   . Arboriculturist in the Last Year:   Transportation Needs:   . Film/video editor (Medical):   Marland Kitchen Lack of Transportation (Non-Medical):   Physical Activity:   . Days of Exercise per Week:   . Minutes of Exercise per Session:   Stress:   . Feeling of  Stress :   Social Connections:   . Frequency of Communication with Friends and Family:   . Frequency of Social Gatherings with Friends and Family:   . Attends Religious Services:   . Active Member of Clubs or Organizations:   . Attends Archivist Meetings:   Marland Kitchen Marital Status:    Past Surgical History:  Procedure Laterality Date  . ANKLE SURGERY    . BREAST SURGERY  8/09   (Rt)Lumpectomy,sentinel node  . EYE SURGERY    . MASTECTOMY, PARTIAL Right 07/31/08   right node Bx (neg)  . RETINAL DETACHMENT SURGERY  june 28th 2012  . TONSILLECTOMY AND ADENOIDECTOMY     Past Medical History:  Diagnosis  Date  . Aneurysm of aorta ([place]) 8/09   4.1cm ascending aortic aneurysm on breast MRI  . Basal cell cancer   . Cancer ([place]) 07/2008   breast cancer(rt)  . Degenerative disc disease    neck  . Elevated cholesterol   . Fibroid   . Hypertension   . Infertility, female   . Spinal arthritis    Ht 5\' 3"  (1.6 m)   Wt 124 lb (56.2 kg)   BMI 21.97 kg/m   Opioid Risk Score:   Fall Risk Score:  `1  Depression screen PHQ 2/9  Depression screen [facility] 2/9 05/02/2018 03/12/2016 02/13/2016 07/22/2015  Decreased Interest 0 0 0 0  Down, Depressed, Hopeless 0 0 0 0  PHQ - 2 Score 0 0 0 0     Review of Systems     Objective:   Physical Exam Vitals and nursing note reviewed.  Constitutional:      Appearance: Normal appearance.  HENT:     Head: Normocephalic and atraumatic.     Nose: Nose normal.  Eyes:     Extraocular Movements: Extraocular movements intact.     Conjunctiva/sclera: Conjunctivae normal.     Pupils: Pupils are equal, round, and reactive to light.  Neurological:     Mental Status: She is alert and oriented to person, place, and time.  Psychiatric:        Mood and Affect: Mood normal.        Behavior: Behavior normal.        Thought Content: Thought content normal.        Judgment: Judgment normal.   Remainder exam is deferred secondary to WebEx visit        Assessment & Plan:  #1.  Lumbar spondylosis without myelopathy  in the lower lumbar levels affecting L4-5 and L5-S1 and has had very good relief with bilateral L3-4 and L5 dorsal ramus radiofrequency neurotomies performed approximately 9 months ago.  Based on prior response would expect need for repeat injection in 2 to 4 months.  The patient states that she will call if she notices her back pain gradually improving 2.  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 She will continue her tramadol 50 mg twice daily has 2 more month  supply she can call us when she is getting low and this can be handled over the phone. In person visit in 6 months.

## 2020-05-15 DIAGNOSIS — I10 Essential (primary) hypertension: Secondary | ICD-10-CM | POA: Diagnosis not present

## 2020-05-15 DIAGNOSIS — H409 Unspecified glaucoma: Secondary | ICD-10-CM | POA: Diagnosis not present

## 2020-05-15 DIAGNOSIS — Z853 Personal history of malignant neoplasm of breast: Secondary | ICD-10-CM | POA: Diagnosis not present

## 2020-05-15 DIAGNOSIS — E782 Mixed hyperlipidemia: Secondary | ICD-10-CM | POA: Diagnosis not present

## 2020-05-15 DIAGNOSIS — M150 Primary generalized (osteo)arthritis: Secondary | ICD-10-CM | POA: Diagnosis not present

## 2020-05-15 DIAGNOSIS — J479 Bronchiectasis, uncomplicated: Secondary | ICD-10-CM | POA: Diagnosis not present

## 2020-05-28 DIAGNOSIS — L820 Inflamed seborrheic keratosis: Secondary | ICD-10-CM | POA: Diagnosis not present

## 2020-06-03 DIAGNOSIS — K621 Rectal polyp: Secondary | ICD-10-CM | POA: Diagnosis not present

## 2020-06-03 DIAGNOSIS — R197 Diarrhea, unspecified: Secondary | ICD-10-CM | POA: Diagnosis not present

## 2020-06-14 ENCOUNTER — Other Ambulatory Visit: Payer: Self-pay

## 2020-06-14 ENCOUNTER — Encounter: Payer: PPO | Attending: Physical Medicine & Rehabilitation | Admitting: Physical Medicine & Rehabilitation

## 2020-06-14 ENCOUNTER — Encounter: Payer: Self-pay | Admitting: Physical Medicine & Rehabilitation

## 2020-06-14 DIAGNOSIS — M47816 Spondylosis without myelopathy or radiculopathy, lumbar region: Secondary | ICD-10-CM | POA: Diagnosis not present

## 2020-06-14 DIAGNOSIS — M533 Sacrococcygeal disorders, not elsewhere classified: Secondary | ICD-10-CM | POA: Insufficient documentation

## 2020-06-14 DIAGNOSIS — Z5181 Encounter for therapeutic drug level monitoring: Secondary | ICD-10-CM | POA: Insufficient documentation

## 2020-06-14 DIAGNOSIS — G894 Chronic pain syndrome: Secondary | ICD-10-CM | POA: Insufficient documentation

## 2020-06-14 DIAGNOSIS — Z79891 Long term (current) use of opiate analgesic: Secondary | ICD-10-CM | POA: Insufficient documentation

## 2020-06-14 DIAGNOSIS — M47817 Spondylosis without myelopathy or radiculopathy, lumbosacral region: Secondary | ICD-10-CM | POA: Diagnosis not present

## 2020-06-14 NOTE — Progress Notes (Signed)
  PROCEDURE RECORD [facility]   Name: Sandra Tapia DOB:[birth year] MRN: [id number]  Date:06/14/2020  Physician: Alysia Penna, MD    Nurse/CMA: Melinda Gwinner, CMA   Allergies:  Allergies  Allergen Reactions  . Penicillins     Nausea & stomach pains  . Adhesive [Tape] Rash    Gel grounding pads    Consent Signed: Yes.    Is patient diabetic? No.  CBG today?  Pregnant: No. LMP: No LMP recorded. Patient is postmenopausal. (age 60-55)  Anticoagulants: no Anti-inflammatory: no Antibiotics: no  Procedure: left L3,5  Radiofrequency neurotomy  Position: Prone Start Time: 12:45pm  End Time: 12:59pm  Fluoro Time: 37s  RN/CMA Kailyn Dubie, CMA Siddh Vandeventer, CMA    Time 12:25pm 1:06pm    BP 126/76 124/74    Pulse 55 57    Respirations 14 14    O2 Sat 96 95    S/S 6 6    Pain Level 6/10 0/10     D/C home with husband, [name], D/C instructions reviewed, and sits independently.

## 2020-06-14 NOTE — Patient Instructions (Signed)

## 2020-06-14 NOTE — Progress Notes (Signed)
Left L5 dorsal ramus., left L4 and left L3 medial branch radio frequency neurotomy under fluoroscopic guidance  Indication: Low back pain due to lumbar spondylosis which has been relieved on 2 occasions by greater than 50% by lumbar medial branch blocks at corresponding levels.  Informed consent was obtained after describing risks and benefits of the procedure with the patient, this includes bleeding, bruising, infection, paralysis and medication side effects. The patient wishes to proceed and has given written consent. The patient was placed in a prone position. The lumbar and sacral area was marked and prepped with Betadine. A 25-gauge 1-1/2 inch needle was inserted into the skin and subcutaneous tissue at 3 sites in one ML of 1% lidocaine was injected into each site. Then a 20-gauge 10 cm radio frequency needle with a 1 cm curved active tip was inserted targeting the left S1 SAP/sacral ala junction. Bone contact was made and confirmed with lateral imaging. Sensory stimulation at 50 Hz followed by motor stimulation at 2 Hz confirm proper needle location followed by injection of one ML of the solution containing one ML of 4 mg per mL dexamethasone and 3 mL of 1% MPF lidocaine. Then the left L5 SAP/transverse process junction was targeted. Bone contact was made and confirmed with lateral imaging. Sensory stimulation at 50 Hz followed by motor stimulation at 2 Hz confirm proper needle location followed by injection of one ML of the solution containing one ML of 4 mg per mL dexamethasone and 3 mL of 1% MPF lidocaine. Then the left L4 SAP/transverse process junction was targeted. Bone contact was made and confirmed with lateral imaging. Sensory stimulation at 50 Hz followed by motor stimulation at 2 Hz confirm proper needle location followed by injection of one ML of the solution containing one ML of 4 mg per mL dexamethasone and 3 mL of 1% MPF lidocaine. Radio frequency lesion being at [facility] for 90 seconds was  performed. Needles were removed. Post procedure instructions and vital signs were performed. Patient tolerated procedure well. Followup appointment was given.

## 2020-06-18 DIAGNOSIS — E782 Mixed hyperlipidemia: Secondary | ICD-10-CM | POA: Diagnosis not present

## 2020-06-18 DIAGNOSIS — M150 Primary generalized (osteo)arthritis: Secondary | ICD-10-CM | POA: Diagnosis not present

## 2020-06-18 DIAGNOSIS — Z853 Personal history of malignant neoplasm of breast: Secondary | ICD-10-CM | POA: Diagnosis not present

## 2020-06-18 DIAGNOSIS — I10 Essential (primary) hypertension: Secondary | ICD-10-CM | POA: Diagnosis not present

## 2020-06-18 DIAGNOSIS — J479 Bronchiectasis, uncomplicated: Secondary | ICD-10-CM | POA: Diagnosis not present

## 2020-06-18 DIAGNOSIS — H409 Unspecified glaucoma: Secondary | ICD-10-CM | POA: Diagnosis not present

## 2020-07-02 ENCOUNTER — Ambulatory Visit: Payer: PPO | Admitting: Physical Medicine & Rehabilitation

## 2020-07-04 DIAGNOSIS — K621 Rectal polyp: Secondary | ICD-10-CM | POA: Diagnosis not present

## 2020-07-04 DIAGNOSIS — R198 Other specified symptoms and signs involving the digestive system and abdomen: Secondary | ICD-10-CM | POA: Diagnosis not present

## 2020-07-04 DIAGNOSIS — R197 Diarrhea, unspecified: Secondary | ICD-10-CM | POA: Diagnosis not present

## 2020-07-04 DIAGNOSIS — K625 Hemorrhage of anus and rectum: Secondary | ICD-10-CM | POA: Diagnosis not present

## 2020-07-04 DIAGNOSIS — R195 Other fecal abnormalities: Secondary | ICD-10-CM | POA: Diagnosis not present

## 2020-07-16 ENCOUNTER — Encounter: Payer: Self-pay | Admitting: Physical Medicine & Rehabilitation

## 2020-07-16 ENCOUNTER — Other Ambulatory Visit: Payer: Self-pay

## 2020-07-16 ENCOUNTER — Encounter: Payer: PPO | Attending: Physical Medicine & Rehabilitation | Admitting: Physical Medicine & Rehabilitation

## 2020-07-16 VITALS — BP 126/75 | HR 55 | Temp 98.7°F | Resp 12 | Ht 63.0 in | Wt 125.2 lb

## 2020-07-16 DIAGNOSIS — M47816 Spondylosis without myelopathy or radiculopathy, lumbar region: Secondary | ICD-10-CM | POA: Insufficient documentation

## 2020-07-16 DIAGNOSIS — G894 Chronic pain syndrome: Secondary | ICD-10-CM | POA: Insufficient documentation

## 2020-07-16 DIAGNOSIS — M47817 Spondylosis without myelopathy or radiculopathy, lumbosacral region: Secondary | ICD-10-CM

## 2020-07-16 DIAGNOSIS — Z79891 Long term (current) use of opiate analgesic: Secondary | ICD-10-CM | POA: Diagnosis not present

## 2020-07-16 DIAGNOSIS — M533 Sacrococcygeal disorders, not elsewhere classified: Secondary | ICD-10-CM | POA: Insufficient documentation

## 2020-07-16 DIAGNOSIS — Z5181 Encounter for therapeutic drug level monitoring: Secondary | ICD-10-CM | POA: Diagnosis not present

## 2020-07-16 MED ORDER — TRAMADOL HCL 50 MG PO TABS: 50.0000 mg | ORAL_TABLET | Freq: Two times a day (BID) | ORAL | 5 refills | Status: DC

## 2020-07-16 NOTE — Progress Notes (Signed)

## 2020-07-16 NOTE — Patient Instructions (Addendum)
You had a radio frequency procedure today This was done to alleviate joint pain in your lumbar area We injected lidocaine which is a local anesthetic.  You may experience soreness at the injection sites. You may also experienced some irritation of the nerves that were heated I'm recommending ice for 30 minutes every 2 hours as needed for the next 24-48 hours   

## 2020-07-16 NOTE — Progress Notes (Signed)
  PROCEDURE RECORD [facility]   Name: Sandra Tapia DOB:November 16, 1946 MRN: [id number]  Date:07/16/2020  Physician: Alysia Penna, MD    Nurse/CMA: Jorja Loa MA  Allergies:  Allergies  Allergen Reactions  . Penicillins     Nausea & stomach pains  . Adhesive [Tape] Rash    Gel grounding pads    Consent Signed: Yes.    Is patient diabetic? No.  CBG today? N/A   Pregnant: No. LMP: No LMP recorded. Patient is postmenopausal. (age 79-55)  Anticoagulants: no Anti-inflammatory: no Antibiotics: no  ProcedureRight L3, L4, L58medial branch radio frequency under fluoroscopic guidance  Position: Prone Start Time: 12:36 PM End Time: 12:50 PM Fluoro Time: 76  RN/CMA Juri Dinning, MA Mairany Bruno, MA    Time 12:15 PM 12:57 PM    BP 126/75 137/76    Pulse 56 55    Respirations 12 14    O2 Sat 95 97    S/S 6 6    Pain Level 5 0     D/C home with Mr. Monrreal, [name], D/C instructions reviewed, and sits independently.

## 2020-07-29 ENCOUNTER — Encounter: Payer: Self-pay | Admitting: Internal Medicine

## 2020-07-29 ENCOUNTER — Ambulatory Visit (INDEPENDENT_AMBULATORY_CARE_PROVIDER_SITE_OTHER): Payer: PPO

## 2020-07-29 ENCOUNTER — Ambulatory Visit (INDEPENDENT_AMBULATORY_CARE_PROVIDER_SITE_OTHER): Payer: PPO | Admitting: Internal Medicine

## 2020-07-29 ENCOUNTER — Other Ambulatory Visit: Payer: Self-pay

## 2020-07-29 DIAGNOSIS — J479 Bronchiectasis, uncomplicated: Secondary | ICD-10-CM

## 2020-07-29 MED ORDER — AZELASTINE-FLUTICASONE 137-50 MCG/ACT NA SUSP: 1.0000 | Freq: Two times a day (BID) | NASAL | 11 refills | Status: DC

## 2020-07-29 NOTE — Patient Instructions (Addendum)
Try prilosec (omeprazole) otc 20mg   Take 30-60 min before first meal of the day and Pepcid ac (famotidine) 20 mg one an hour or so before  bedtime until cough is completely gone for at least a week without the need for cough suppression  GERD (REFLUX)  is an extremely common cause of respiratory symptoms just like yours , many times with no obvious heartburn at all.    It can be treated with medication, but also with lifestyle changes including elevation of the head of your bed (ideally with 6 -8inch blocks under the headboard of your bed),  Smoking cessation, avoidance of late meals, excessive alcohol, and avoid fatty foods, chocolate, peppermint, colas, red wine, and acidic juices such as orange juice.  NO MINT OR MENTHOL PRODUCTS SO NO COUGH DROPS  USE SUGARLESS CANDY INSTEAD (Jolley ranchers or Stover's or Life Savers) or even ice chips will also do - the key is to swallow to prevent all throat clearing. NO OIL BASED VITAMINS - use powdered substitutes.  Avoid fish oil when coughing.  Late add:  Change nasal spray to dymista 1-2 twice daily each nostril    Please remember to go to the  x-ray department  for your tests - we will call you with the results when they are available     Please schedule a follow up visit in  12 months but call sooner if needed

## 2020-07-29 NOTE — Progress Notes (Signed)
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     History of Present Illness   12/26/2018  Pulmonary/ 1st office eval/[facility] Gluth  Chief Complaint  Patient presents with   Pulmonary Consult    Referred by Dr Theadore Nan.  Pt c/o cough since Dec 2018. Cough is occ prod with yellow sputum.    Dyspnea:  Not limited by breathing from desired activities   Cough: worse in late am's, better with cough drops / min slt yellow mucus Sleep: able to lie flat, one pillow  SABA use: none  rec Stop lisinopril and start micardis 40-12.5 one daily (take a half if too strong, take two if too weak Omeprazole 20 mg Take 30- 60 min before your first and last meals of the day (until 100% better, then just once a day should do. GERD diet   01/25/2019  f/u ov/Dionne Knoop re: acei cough/ bronchiectasis/ hbp over treated on micardis 40-12.5 just taking a quarter Chief Complaint  Patient presents with   Follow-up    Cough is much improved. She states she has some congestion in her throat in the am.   Dyspnea:  Not limited by breathing from desired activities   Cough: sense of globus day not noct Sleeping: fine flat SABA use: none  02: none rec Bronchiectasis =   you have scarring of your bronchial tubes which means that they don't function  perfectly normally and mucus tends to pool in certain areas of your lung which can cause pneumonia and further scarring of your lung and bronchial tubes(think of a broken escalator) Whenever you develop cough congestion take mucinex or mucinex dm        07/26/2019  f/u ov/Marvalene Barrett re:  Bronchiectasis with MZ phenotype, acei case Chief Complaint  Patient presents with   Follow-up    PFT's done today. She states her cough has been better. No new co's.   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  - the Z gene risks emphysema and bronchiectasis especially if you smoke  Dymista has astelin plus flonase would be a good option for you if not doing well with just the astelin pointer toward you ear on same side    07/29/2020  f/u ov/Narcissa Melder re: bronchiectasis  /rhinitis has not tried dymista yet Chief Complaint  Patient presents with   Follow-up    Denies any problems   Dyspnea:  Walking daily up to 40 min some hills no  sob  Cough: worse first thing in am x sev months, not much production/ occ  light green assoc with nasal drainage  Sleeping: flat  bed on side one pillow  SABA use: none  02: none   No obvious day to day or daytime variability or assoc r mucus plugs or hemoptysis or cp or chest tightness, subjective wheeze or overt sinus or hb symptoms.   Sleeping  without nocturnal  or early am exacerbation  of respiratory  c/o's or need for noct saba. Also denies any obvious fluctuation of symptoms with weather or environmental changes or other aggravating or alleviating factors except as outlined above   No unusual exposure hx or h/o childhood pna/ asthma or knowledge of premature birth.  Current Allergies, Complete Past Medical History, Past Surgical  History, Family History, and Social History were reviewed in [employer] record.  ROS  The following are not active complaints unless bolded Hoarseness, sore throat, dysphagia, dental problems, itching, sneezing,  nasal congestion or discharge of excess mucus or purulent secretions, ear ache,   fever, chills, sweats, unintended wt loss or wt gain, classically pleuritic or exertional cp,  orthopnea pnd or arm/hand swelling  or leg swelling, presyncope, palpitations, abdominal pain, anorexia, nausea, vomiting, diarrhea  or change in bowel habits or change in bladder habits, change in stools or change in urine, dysuria, hematuria,  rash, arthralgias, visual complaints, headache, numbness, weakness or ataxia or problems with walking or coordination,  change in mood or  memory.        Current Meds  Medication Sig   ALPHAGAN P 0.1 % SOLN Place 1 drop into the left eye 2 (two) times daily.    atorvastatin (LIPITOR) 40 MG tablet Take 40 mg by mouth daily.   Azelastine HCl 0.15 % SOLN Place 1 spray into the nose as needed.   Biotin 10 MG CAPS Take 1 tablet by mouth daily. 1 tablet   Calcium Carbonate Antacid (TUMS ULTRA 1000 PO) Take 3,000 mg by mouth daily.    cetirizine (ZYRTEC) 10 MG tablet Take 10 mg by mouth daily.   diclofenac (VOLTAREN) 75 MG EC tablet Take 1 tablet (75 mg total) by mouth 2 (two) times daily.   dorzolamide-timolol (COSOPT) 22.3-6.8 MG/ML ophthalmic solution Place 1 drop into the left eye 2 (two) times daily.   famotidine (PEPCID) 20 MG tablet Take 20 mg by mouth daily.   fish oil-omega-3 fatty acids 1000 MG capsule Take 3 g by mouth daily.    hydrochlorothiazide (HYDRODIURIL) 12.5 MG tablet Take 6.25 mg by mouth daily.   ketorolac (ACULAR) 0.4 % SOLN Apply 1 drop to eye 4 (four) times daily.   Multiple Vitamin (MULTIVITAMIN) tablet Take 1 tablet by mouth daily.   omeprazole (PRILOSEC) 20 MG capsule Take 20 mg by mouth daily.   pilocarpine  (PILOCAR) 2 % ophthalmic solution Apply to eye as needed.    prednisoLONE acetate (PRED FORTE) 1 % ophthalmic suspension Place 1 drop into the left eye daily.   tazarotene (AVAGE) 0.1 % cream Apply 1 application topically at bedtime.   telmisartan (MICARDIS) 40 MG tablet Take 1 tablet by mouth once daily   traMADol (ULTRAM) 50 MG tablet Take 1 tablet (50 mg total) by mouth 2 (two) times daily.          Objective:    amb pleasant  wf nad   Vital signs reviewed  07/29/2020  - Note at rest 02 sats  98% on RA  07/29/2020          125   01/25/19 126 lb 9.6 oz (57.4 kg)  12/26/18 126 lb 12.8 oz (57.5 kg)  10/07/18 125 lb 6.4 oz (56.9 kg)       HEENT : pt wearing mask not removed for exam due to covid -19 concerns.    NECK :  without JVD/Nodes/TM/ nl carotid upstrokes bilaterally   LUNGS: no acc muscle use, Scoliotic contour chest with minimal crackles insp bilaterally without cough on insp or exp maneuvers   CV:  RRR  no s3 or  2/6 SEM or increase in P2, and no edema   ABD:  soft and nontender with nl inspiratory excursion in the supine position. No bruits or organomegaly appreciated, bowel sounds nl  MS:  Nl gait/ ext warm without deformities, calf tenderness, cyanosis or clubbing No obvious joint restrictions   SKIN: warm and dry without lesions    NEURO:  alert, approp, nl sensorium with  no motor or cerebellar deficits apparent.         CXR PA and Lateral:   07/29/2020 :    I personally reviewed images and  impression as follows:   Mod scoliosis , no def acute changes          Assessment

## 2020-07-30 ENCOUNTER — Telehealth: Payer: Self-pay | Admitting: Internal Medicine

## 2020-07-30 ENCOUNTER — Encounter: Payer: Self-pay | Admitting: Internal Medicine

## 2020-07-30 NOTE — Progress Notes (Signed)
Called and spoke with patient about xray results per Dr Wert. All questions answered and patient expressed understanding. Nothing further needed at this time.

## 2020-07-30 NOTE — Assessment & Plan Note (Signed)
Assoc with scoliosis/ S/p R breast RT 2009 with nl baseline cxr 01/27/18 and new plain cxr changes RLL post basal segment 08/26/18  - CT chest with contrast 01/24/2019  1. Spectrum of findings within the right lung including bronchiectasis airspace consolidation and tree-in-bud nodularity identified. Imaging findings are favored to represent sequelae of chronic, atypical infection such as MAI. Recurrent aspiration is also a differential consideration. - Quant Ig's  01/25/2019  Ok - Alpha one screen 01/25/2019  MZ Level 97  - PFT's  07/26/2019  FEV1 1.95 (95 % ) ratio 0.76  p 7 % improvement from saba p no prior to study with DLCO  120.26  (110%) corrects to 4.62 (111%)  for alv volume and FV curve    No evidence of dz progression despite MZ status.  Her am "throat congestion" is either due to pnds or noct gerd or combination of the two so added max gerd rx / dymista with f/u ent prn          Each maintenance medication was reviewed in detail including emphasizing most importantly the difference between maintenance and prns and under what circumstances the prns are to be triggered using an action plan format where appropriate.  Total time for H and P, chart review, counseling, teaching device (approp nasal device application)  and generating customized AVS unique to this office visit / charting = 20  min

## 2020-07-30 NOTE — Telephone Encounter (Signed)
PA request was received from (pharmacy): yes, [facility] Fax: [phone] Medication name and strength: Azelastine-fluticasone 137-50 mcg/act suspension Ordering Provider: Christinia Gully  Was PA started with [facility]?: yes If yes, please enter KEY: [id number] Medication tried and failed: None Covered Alternatives: unknown  PA sent to plan, time frame for approval / denial: 72 hours Routing to [place] for follow-up

## 2020-07-31 NOTE — Telephone Encounter (Signed)
Medication name and strength: Dymista PA approved/denied: approved Approval dates: 07/30/20-12/27/20 If denied, reason for denial: n/a  Pharmacy and patient aware.

## 2020-08-06 DIAGNOSIS — K5289 Other specified noninfective gastroenteritis and colitis: Secondary | ICD-10-CM | POA: Diagnosis not present

## 2020-08-06 DIAGNOSIS — R197 Diarrhea, unspecified: Secondary | ICD-10-CM | POA: Diagnosis not present

## 2020-08-06 DIAGNOSIS — R194 Change in bowel habit: Secondary | ICD-10-CM | POA: Diagnosis not present

## 2020-08-06 DIAGNOSIS — K6389 Other specified diseases of intestine: Secondary | ICD-10-CM | POA: Diagnosis not present

## 2020-08-06 DIAGNOSIS — K625 Hemorrhage of anus and rectum: Secondary | ICD-10-CM | POA: Diagnosis not present

## 2020-08-12 DIAGNOSIS — K5289 Other specified noninfective gastroenteritis and colitis: Secondary | ICD-10-CM | POA: Diagnosis not present

## 2020-08-13 DIAGNOSIS — L820 Inflamed seborrheic keratosis: Secondary | ICD-10-CM | POA: Diagnosis not present

## 2020-08-30 DIAGNOSIS — H35431 Paving stone degeneration of retina, right eye: Secondary | ICD-10-CM | POA: Diagnosis not present

## 2020-08-30 DIAGNOSIS — H353132 Nonexudative age-related macular degeneration, bilateral, intermediate dry stage: Secondary | ICD-10-CM | POA: Diagnosis not present

## 2020-08-30 DIAGNOSIS — H35352 Cystoid macular degeneration, left eye: Secondary | ICD-10-CM | POA: Diagnosis not present

## 2020-08-30 DIAGNOSIS — H35433 Paving stone degeneration of retina, bilateral: Secondary | ICD-10-CM | POA: Diagnosis not present

## 2020-08-30 DIAGNOSIS — H33002 Unspecified retinal detachment with retinal break, left eye: Secondary | ICD-10-CM | POA: Diagnosis not present

## 2020-08-30 DIAGNOSIS — H20022 Recurrent acute iridocyclitis, left eye: Secondary | ICD-10-CM | POA: Diagnosis not present

## 2020-09-04 DIAGNOSIS — I10 Essential (primary) hypertension: Secondary | ICD-10-CM | POA: Diagnosis not present

## 2020-09-04 DIAGNOSIS — H409 Unspecified glaucoma: Secondary | ICD-10-CM | POA: Diagnosis not present

## 2020-09-04 DIAGNOSIS — J479 Bronchiectasis, uncomplicated: Secondary | ICD-10-CM | POA: Diagnosis not present

## 2020-09-04 DIAGNOSIS — Z853 Personal history of malignant neoplasm of breast: Secondary | ICD-10-CM | POA: Diagnosis not present

## 2020-09-04 DIAGNOSIS — M150 Primary generalized (osteo)arthritis: Secondary | ICD-10-CM | POA: Diagnosis not present

## 2020-09-04 DIAGNOSIS — E782 Mixed hyperlipidemia: Secondary | ICD-10-CM | POA: Diagnosis not present

## 2020-09-11 DIAGNOSIS — K5120 Ulcerative (chronic) proctitis without complications: Secondary | ICD-10-CM | POA: Diagnosis not present

## 2020-09-19 DIAGNOSIS — J479 Bronchiectasis, uncomplicated: Secondary | ICD-10-CM | POA: Diagnosis not present

## 2020-09-19 DIAGNOSIS — E782 Mixed hyperlipidemia: Secondary | ICD-10-CM | POA: Diagnosis not present

## 2020-09-19 DIAGNOSIS — I10 Essential (primary) hypertension: Secondary | ICD-10-CM | POA: Diagnosis not present

## 2020-09-19 DIAGNOSIS — M85852 Other specified disorders of bone density and structure, left thigh: Secondary | ICD-10-CM | POA: Diagnosis not present

## 2020-09-19 DIAGNOSIS — R7309 Other abnormal glucose: Secondary | ICD-10-CM | POA: Diagnosis not present

## 2020-09-19 DIAGNOSIS — I712 Thoracic aortic aneurysm, without rupture: Secondary | ICD-10-CM | POA: Diagnosis not present

## 2020-09-19 DIAGNOSIS — Z0000 Encounter for general adult medical examination without abnormal findings: Secondary | ICD-10-CM | POA: Diagnosis not present

## 2020-09-19 DIAGNOSIS — M150 Primary generalized (osteo)arthritis: Secondary | ICD-10-CM | POA: Diagnosis not present

## 2020-09-19 DIAGNOSIS — K219 Gastro-esophageal reflux disease without esophagitis: Secondary | ICD-10-CM | POA: Diagnosis not present

## 2020-09-19 DIAGNOSIS — M545 Low back pain: Secondary | ICD-10-CM | POA: Diagnosis not present

## 2020-09-19 DIAGNOSIS — E559 Vitamin D deficiency, unspecified: Secondary | ICD-10-CM | POA: Diagnosis not present

## 2020-09-19 DIAGNOSIS — I77810 Thoracic aortic ectasia: Secondary | ICD-10-CM | POA: Diagnosis not present

## 2020-09-24 DIAGNOSIS — Z0000 Encounter for general adult medical examination without abnormal findings: Secondary | ICD-10-CM | POA: Diagnosis not present

## 2020-09-24 DIAGNOSIS — E559 Vitamin D deficiency, unspecified: Secondary | ICD-10-CM | POA: Diagnosis not present

## 2020-09-24 DIAGNOSIS — Z1389 Encounter for screening for other disorder: Secondary | ICD-10-CM | POA: Diagnosis not present

## 2020-09-24 DIAGNOSIS — Z23 Encounter for immunization: Secondary | ICD-10-CM | POA: Diagnosis not present

## 2020-09-24 DIAGNOSIS — Z791 Long term (current) use of non-steroidal anti-inflammatories (NSAID): Secondary | ICD-10-CM | POA: Diagnosis not present

## 2020-09-24 DIAGNOSIS — M85852 Other specified disorders of bone density and structure, left thigh: Secondary | ICD-10-CM | POA: Diagnosis not present

## 2020-10-08 DIAGNOSIS — M8589 Other specified disorders of bone density and structure, multiple sites: Secondary | ICD-10-CM | POA: Diagnosis not present

## 2020-10-08 DIAGNOSIS — Z1231 Encounter for screening mammogram for malignant neoplasm of breast: Secondary | ICD-10-CM | POA: Diagnosis not present

## 2020-10-09 DIAGNOSIS — I10 Essential (primary) hypertension: Secondary | ICD-10-CM | POA: Diagnosis not present

## 2020-10-09 DIAGNOSIS — E782 Mixed hyperlipidemia: Secondary | ICD-10-CM | POA: Diagnosis not present

## 2020-10-09 DIAGNOSIS — I77810 Thoracic aortic ectasia: Secondary | ICD-10-CM | POA: Diagnosis not present

## 2020-10-09 DIAGNOSIS — K120 Recurrent oral aphthae: Secondary | ICD-10-CM | POA: Diagnosis not present

## 2020-10-09 DIAGNOSIS — M150 Primary generalized (osteo)arthritis: Secondary | ICD-10-CM | POA: Diagnosis not present

## 2020-10-09 DIAGNOSIS — J479 Bronchiectasis, uncomplicated: Secondary | ICD-10-CM | POA: Diagnosis not present

## 2020-10-09 DIAGNOSIS — K5120 Ulcerative (chronic) proctitis without complications: Secondary | ICD-10-CM | POA: Diagnosis not present

## 2020-10-09 DIAGNOSIS — H3530 Unspecified macular degeneration: Secondary | ICD-10-CM | POA: Diagnosis not present

## 2020-10-09 DIAGNOSIS — I712 Thoracic aortic aneurysm, without rupture: Secondary | ICD-10-CM | POA: Diagnosis not present

## 2020-10-09 DIAGNOSIS — K219 Gastro-esophageal reflux disease without esophagitis: Secondary | ICD-10-CM | POA: Diagnosis not present

## 2020-10-09 DIAGNOSIS — E559 Vitamin D deficiency, unspecified: Secondary | ICD-10-CM | POA: Diagnosis not present

## 2020-10-09 DIAGNOSIS — R7309 Other abnormal glucose: Secondary | ICD-10-CM | POA: Diagnosis not present

## 2020-10-17 ENCOUNTER — Telehealth: Payer: Self-pay | Admitting: Physical Medicine & Rehabilitation

## 2020-10-17 NOTE — Telephone Encounter (Signed)
Schedule for bilateral sacroiliac injections

## 2020-10-17 NOTE — Telephone Encounter (Signed)
Patient is scheduled for a follow up visit with Dr. Letta Pate on 11/2, and she is wanting to get an injection done that day.  I am not sure which injection that you would need to do for her and want to make sure we have enough time to do it in.  Please advise.

## 2020-10-29 ENCOUNTER — Encounter: Payer: PPO | Attending: Physical Medicine & Rehabilitation | Admitting: Physical Medicine & Rehabilitation

## 2020-10-29 ENCOUNTER — Other Ambulatory Visit: Payer: Self-pay

## 2020-10-29 ENCOUNTER — Ambulatory Visit: Payer: PPO | Admitting: Physical Medicine & Rehabilitation

## 2020-10-29 ENCOUNTER — Encounter: Payer: Self-pay | Admitting: Physical Medicine & Rehabilitation

## 2020-10-29 VITALS — BP 119/74 | HR 57 | Temp 97.6°F | Ht 63.0 in | Wt 124.6 lb

## 2020-10-29 DIAGNOSIS — M533 Sacrococcygeal disorders, not elsewhere classified: Secondary | ICD-10-CM | POA: Diagnosis not present

## 2020-10-29 NOTE — Progress Notes (Signed)
  PROCEDURE RECORD [facility]   Name: Sandra Tapia DOB:January 01, 1946 MRN: [id number]  Date:10/29/2020  Physician: Alysia Penna, MD    Nurse/CMA: Wessling, CMA  Allergies:  Allergies  Allergen Reactions  . Penicillins     Nausea & stomach pains  . Adhesive [Tape] Rash    Gel grounding pads    Consent Signed: Yes.    Is patient diabetic? No.  CBG today?   Pregnant: No. LMP: No LMP recorded. Patient is postmenopausal. (age 67-55)  Anticoagulants: no Anti-inflammatory: no Antibiotics: no  Procedure: Bilateral Sacroiliac Joint Position: Prone Start Time: 2:17pm      End Time:   2:26pm      Fluoro Time: 16s  RN/CMA Truman Hayward, CMA Wessling, CMA    Time 1:46pm 2:32pm    BP 119/74 125/71    Pulse 57 58    Respirations 16 16    O2 Sat 90 94    S/S 6 6    Pain Level 6/10 1/10     D/C home with husband, [name], D/C instructions reviewed, and sits independently.

## 2020-10-29 NOTE — Progress Notes (Signed)

## 2020-10-29 NOTE — Patient Instructions (Signed)
Sacroiliac injection was performed today. A combination of a naming medicine plus a cortisone medicine was injected. The injection was done under x-ray guidance. This procedure has been performed to help reduce low back and buttocks pain as well as potentially hip pain. The duration of this injection is variable lasting from hours to  Months. It may repeated if needed. 

## 2020-10-30 DIAGNOSIS — K5120 Ulcerative (chronic) proctitis without complications: Secondary | ICD-10-CM | POA: Diagnosis not present

## 2020-11-08 DIAGNOSIS — E782 Mixed hyperlipidemia: Secondary | ICD-10-CM | POA: Diagnosis not present

## 2020-11-08 DIAGNOSIS — H409 Unspecified glaucoma: Secondary | ICD-10-CM | POA: Diagnosis not present

## 2020-11-08 DIAGNOSIS — M150 Primary generalized (osteo)arthritis: Secondary | ICD-10-CM | POA: Diagnosis not present

## 2020-11-08 DIAGNOSIS — Z853 Personal history of malignant neoplasm of breast: Secondary | ICD-10-CM | POA: Diagnosis not present

## 2020-11-08 DIAGNOSIS — G8929 Other chronic pain: Secondary | ICD-10-CM | POA: Diagnosis not present

## 2020-11-08 DIAGNOSIS — I10 Essential (primary) hypertension: Secondary | ICD-10-CM | POA: Diagnosis not present

## 2020-11-08 DIAGNOSIS — K219 Gastro-esophageal reflux disease without esophagitis: Secondary | ICD-10-CM | POA: Diagnosis not present

## 2020-11-08 DIAGNOSIS — J479 Bronchiectasis, uncomplicated: Secondary | ICD-10-CM | POA: Diagnosis not present

## 2020-11-13 DIAGNOSIS — L820 Inflamed seborrheic keratosis: Secondary | ICD-10-CM | POA: Diagnosis not present

## 2021-01-02 DIAGNOSIS — Z1152 Encounter for screening for COVID-19: Secondary | ICD-10-CM | POA: Diagnosis not present

## 2021-01-07 DIAGNOSIS — K6389 Other specified diseases of intestine: Secondary | ICD-10-CM | POA: Diagnosis not present

## 2021-01-07 DIAGNOSIS — K5120 Ulcerative (chronic) proctitis without complications: Secondary | ICD-10-CM | POA: Diagnosis not present

## 2021-01-10 DIAGNOSIS — H43391 Other vitreous opacities, right eye: Secondary | ICD-10-CM | POA: Diagnosis not present

## 2021-01-10 DIAGNOSIS — H20022 Recurrent acute iridocyclitis, left eye: Secondary | ICD-10-CM | POA: Diagnosis not present

## 2021-01-10 DIAGNOSIS — H353132 Nonexudative age-related macular degeneration, bilateral, intermediate dry stage: Secondary | ICD-10-CM | POA: Diagnosis not present

## 2021-01-10 DIAGNOSIS — H35431 Paving stone degeneration of retina, right eye: Secondary | ICD-10-CM | POA: Diagnosis not present

## 2021-01-10 DIAGNOSIS — H35352 Cystoid macular degeneration, left eye: Secondary | ICD-10-CM | POA: Diagnosis not present

## 2021-01-10 DIAGNOSIS — H33002 Unspecified retinal detachment with retinal break, left eye: Secondary | ICD-10-CM | POA: Diagnosis not present

## 2021-01-20 DIAGNOSIS — K51311 Ulcerative (chronic) rectosigmoiditis with rectal bleeding: Secondary | ICD-10-CM | POA: Diagnosis not present

## 2021-02-12 DIAGNOSIS — M150 Primary generalized (osteo)arthritis: Secondary | ICD-10-CM | POA: Diagnosis not present

## 2021-02-12 DIAGNOSIS — E782 Mixed hyperlipidemia: Secondary | ICD-10-CM | POA: Diagnosis not present

## 2021-02-12 DIAGNOSIS — Z853 Personal history of malignant neoplasm of breast: Secondary | ICD-10-CM | POA: Diagnosis not present

## 2021-02-12 DIAGNOSIS — J479 Bronchiectasis, uncomplicated: Secondary | ICD-10-CM | POA: Diagnosis not present

## 2021-02-12 DIAGNOSIS — I10 Essential (primary) hypertension: Secondary | ICD-10-CM | POA: Diagnosis not present

## 2021-02-12 DIAGNOSIS — K219 Gastro-esophageal reflux disease without esophagitis: Secondary | ICD-10-CM | POA: Diagnosis not present

## 2021-02-12 DIAGNOSIS — H409 Unspecified glaucoma: Secondary | ICD-10-CM | POA: Diagnosis not present

## 2021-02-12 DIAGNOSIS — G8929 Other chronic pain: Secondary | ICD-10-CM | POA: Diagnosis not present

## 2021-02-18 DIAGNOSIS — L820 Inflamed seborrheic keratosis: Secondary | ICD-10-CM | POA: Diagnosis not present

## 2021-03-23 ENCOUNTER — Other Ambulatory Visit: Payer: Self-pay | Admitting: Physical Medicine & Rehabilitation

## 2021-04-09 DIAGNOSIS — K51311 Ulcerative (chronic) rectosigmoiditis with rectal bleeding: Secondary | ICD-10-CM | POA: Diagnosis not present

## 2021-04-15 DIAGNOSIS — M85852 Other specified disorders of bone density and structure, left thigh: Secondary | ICD-10-CM | POA: Diagnosis not present

## 2021-04-15 DIAGNOSIS — E559 Vitamin D deficiency, unspecified: Secondary | ICD-10-CM | POA: Diagnosis not present

## 2021-04-15 DIAGNOSIS — Z1152 Encounter for screening for COVID-19: Secondary | ICD-10-CM | POA: Diagnosis not present

## 2021-04-15 DIAGNOSIS — Z791 Long term (current) use of non-steroidal anti-inflammatories (NSAID): Secondary | ICD-10-CM | POA: Diagnosis not present

## 2021-04-15 DIAGNOSIS — Z0000 Encounter for general adult medical examination without abnormal findings: Secondary | ICD-10-CM | POA: Diagnosis not present

## 2021-04-15 DIAGNOSIS — Z1389 Encounter for screening for other disorder: Secondary | ICD-10-CM | POA: Diagnosis not present

## 2021-04-15 DIAGNOSIS — Z23 Encounter for immunization: Secondary | ICD-10-CM | POA: Diagnosis not present

## 2021-04-18 DIAGNOSIS — G8929 Other chronic pain: Secondary | ICD-10-CM | POA: Diagnosis not present

## 2021-04-18 DIAGNOSIS — Z5181 Encounter for therapeutic drug level monitoring: Secondary | ICD-10-CM | POA: Diagnosis not present

## 2021-04-18 DIAGNOSIS — M85852 Other specified disorders of bone density and structure, left thigh: Secondary | ICD-10-CM | POA: Diagnosis not present

## 2021-04-18 DIAGNOSIS — H409 Unspecified glaucoma: Secondary | ICD-10-CM | POA: Diagnosis not present

## 2021-04-18 DIAGNOSIS — I10 Essential (primary) hypertension: Secondary | ICD-10-CM | POA: Diagnosis not present

## 2021-04-18 DIAGNOSIS — J301 Allergic rhinitis due to pollen: Secondary | ICD-10-CM | POA: Diagnosis not present

## 2021-04-18 DIAGNOSIS — K51311 Ulcerative (chronic) rectosigmoiditis with rectal bleeding: Secondary | ICD-10-CM | POA: Diagnosis not present

## 2021-04-18 DIAGNOSIS — Z791 Long term (current) use of non-steroidal anti-inflammatories (NSAID): Secondary | ICD-10-CM | POA: Diagnosis not present

## 2021-04-18 DIAGNOSIS — R7303 Prediabetes: Secondary | ICD-10-CM | POA: Diagnosis not present

## 2021-04-18 DIAGNOSIS — M5451 Vertebrogenic low back pain: Secondary | ICD-10-CM | POA: Diagnosis not present

## 2021-04-18 DIAGNOSIS — E782 Mixed hyperlipidemia: Secondary | ICD-10-CM | POA: Diagnosis not present

## 2021-04-18 DIAGNOSIS — I712 Thoracic aortic aneurysm, without rupture: Secondary | ICD-10-CM | POA: Diagnosis not present

## 2021-05-05 DIAGNOSIS — K219 Gastro-esophageal reflux disease without esophagitis: Secondary | ICD-10-CM | POA: Diagnosis not present

## 2021-05-05 DIAGNOSIS — G8929 Other chronic pain: Secondary | ICD-10-CM | POA: Diagnosis not present

## 2021-05-05 DIAGNOSIS — I10 Essential (primary) hypertension: Secondary | ICD-10-CM | POA: Diagnosis not present

## 2021-05-05 DIAGNOSIS — H409 Unspecified glaucoma: Secondary | ICD-10-CM | POA: Diagnosis not present

## 2021-05-05 DIAGNOSIS — J479 Bronchiectasis, uncomplicated: Secondary | ICD-10-CM | POA: Diagnosis not present

## 2021-05-05 DIAGNOSIS — M150 Primary generalized (osteo)arthritis: Secondary | ICD-10-CM | POA: Diagnosis not present

## 2021-05-05 DIAGNOSIS — E782 Mixed hyperlipidemia: Secondary | ICD-10-CM | POA: Diagnosis not present

## 2021-05-23 DIAGNOSIS — H26491 Other secondary cataract, right eye: Secondary | ICD-10-CM | POA: Diagnosis not present

## 2021-05-23 DIAGNOSIS — H43391 Other vitreous opacities, right eye: Secondary | ICD-10-CM | POA: Diagnosis not present

## 2021-05-23 DIAGNOSIS — H33002 Unspecified retinal detachment with retinal break, left eye: Secondary | ICD-10-CM | POA: Diagnosis not present

## 2021-05-23 DIAGNOSIS — H353132 Nonexudative age-related macular degeneration, bilateral, intermediate dry stage: Secondary | ICD-10-CM | POA: Diagnosis not present

## 2021-05-23 DIAGNOSIS — H35352 Cystoid macular degeneration, left eye: Secondary | ICD-10-CM | POA: Diagnosis not present

## 2021-05-23 DIAGNOSIS — H20022 Recurrent acute iridocyclitis, left eye: Secondary | ICD-10-CM | POA: Diagnosis not present

## 2021-05-23 DIAGNOSIS — H40052 Ocular hypertension, left eye: Secondary | ICD-10-CM | POA: Diagnosis not present

## 2021-05-23 DIAGNOSIS — H35433 Paving stone degeneration of retina, bilateral: Secondary | ICD-10-CM | POA: Diagnosis not present

## 2021-05-23 DIAGNOSIS — H35431 Paving stone degeneration of retina, right eye: Secondary | ICD-10-CM | POA: Diagnosis not present

## 2021-06-01 ENCOUNTER — Other Ambulatory Visit: Payer: Self-pay | Admitting: Physical Medicine & Rehabilitation

## 2021-06-25 DIAGNOSIS — H26491 Other secondary cataract, right eye: Secondary | ICD-10-CM | POA: Diagnosis not present

## 2021-07-01 ENCOUNTER — Telehealth: Payer: Self-pay

## 2021-07-01 NOTE — Telephone Encounter (Signed)
Sandra Tapia is having a level 7/10 back pain without pain medicine. She has requested a RF ASAP. Pain on both sides but the left side is worse.   Patient has been advised to make a follow up appointment for assessment. For insurance purpose but an message will be sent to Dr. Letta Pate.

## 2021-07-17 DIAGNOSIS — K51311 Ulcerative (chronic) rectosigmoiditis with rectal bleeding: Secondary | ICD-10-CM | POA: Diagnosis not present

## 2021-07-21 DIAGNOSIS — I10 Essential (primary) hypertension: Secondary | ICD-10-CM | POA: Diagnosis not present

## 2021-07-29 ENCOUNTER — Encounter: Payer: Self-pay | Admitting: Internal Medicine

## 2021-07-29 ENCOUNTER — Ambulatory Visit (INDEPENDENT_AMBULATORY_CARE_PROVIDER_SITE_OTHER): Payer: PPO

## 2021-07-29 ENCOUNTER — Other Ambulatory Visit: Payer: Self-pay

## 2021-07-29 ENCOUNTER — Ambulatory Visit (INDEPENDENT_AMBULATORY_CARE_PROVIDER_SITE_OTHER): Payer: PPO | Admitting: Internal Medicine

## 2021-07-29 DIAGNOSIS — J479 Bronchiectasis, uncomplicated: Secondary | ICD-10-CM

## 2021-07-29 DIAGNOSIS — J9811 Atelectasis: Secondary | ICD-10-CM | POA: Diagnosis not present

## 2021-07-29 NOTE — Progress Notes (Signed)
Sandra Tapia, female    DOB: [birth year]    MRN: [id number]     Brief patient profile:  75yowf never smoker  MZ phenotype with  "bronchitis" as child and did track in HS with onset of hoarseness, runny nose in springtime around early 2000  some itching / sneezing evolving to year round some better on allegra / flonase then onset abruptly Nov 28 2017 hoarseness and coughing and wheezing rx omeprazole by Dr Leonides Schanz and seemed to improve on zantac in am and ppi otc in pm and then gradually worse since May 2019 attributed to zantac going off the market and since then cough worsened since then and changed to omeprazole 20 mg bid ac and better but cough persisted so referred to pulmonary clinic 12/26/2018 by Dr  Leonides Schanz.     History of Present Illness   12/26/2018  Pulmonary/ 1st office eval/Sandra Tapia  Chief Complaint  Patient presents with   Pulmonary Consult    Referred by Dr Theadore Nan.  Pt c/o cough since Dec 2018. Cough is occ prod with yellow sputum.    Dyspnea:  Not limited by breathing from desired activities   Cough: worse in late am's, better with cough drops / min slt yellow mucus Sleep: able to lie flat, one pillow  SABA use: none  rec Stop lisinopril and start micardis 40-12.5 one daily (take a half if too strong, take two if too weak Omeprazole 20 mg Take 30- 60 min before your first and last meals of the day (until 100% better, then just once a day should do. GERD diet   01/25/2019  f/u ov/Sandra Tapia re: acei cough/ bronchiectasis/ hbp over treated on micardis 40-12.5 just taking a quarter Chief Complaint  Patient presents with   Follow-up    Cough is much improved. She states she has some congestion in her throat in the am.   Dyspnea:  Not limited by breathing from desired activities   Cough: sense of globus day not noct Sleeping: fine flat SABA use: none  02: none rec Bronchiectasis =   you have scarring of your bronchial tubes which means that they don't function perfectly  normally and mucus tends to pool in certain areas of your lung which can cause pneumonia and further scarring of your lung and bronchial tubes(think of a broken escalator) Whenever you develop cough congestion take mucinex or mucinex dm        07/26/2019  f/u ov/Sandra Tapia re:  Bronchiectasis with MZ phenotype, acei case Chief Complaint  Patient presents with   Follow-up    PFT's done today. She states her cough has been better. No new co's.   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  - the Z gene risks emphysema and bronchiectasis especially if you smoke  Dymista has astelin plus flonase would be a good option for you if not doing well with just the astelin pointer toward you ear on same side    07/29/2020  f/u ov/Sandra Tapia re: MZ/bronchiectasis  /rhinitis has not tried dymista yet Chief Complaint  Patient presents with   Follow-up    Denies any problems   Dyspnea:  Walking daily up to 40 min some hills no sob  Cough: worse first thing in am x sev months, not much production/ occ  light green assoc with nasal drainage  Sleeping: flat  bed on side one pillow  SABA use: none  02: none  Rec Try prilosec (omeprazole) otc '20mg'$   Take 30-60 min before first meal of the day and Pepcid ac (famotidine) 20 mg one an hour or so before  bedtime until cough is completely gone for at least a week without the need for cough suppression GERD diet reviewed, bed blocks rec   Late add:  Change nasal spray to dymista 1-2 twice daily each nostril    07/29/2021  f/u ov/Sandra Tapia re: bronchiectasis /rhinitis Chief Complaint  Patient presents with   Follow-up    Breathing is overall doing well. She has occ cough with minimal yellow sputum first thing in the am.     Dyspnea:  still  walking up to 40 min daily  Cough: worse first thing in am x one hour clearing throat but min mucoid  sputum without obvious pnds/ nasal discharge now  Sleeping: bunch of pillows  SABA use: none  02: none  Covid status:   vax x 4    No obvious day to day or daytime variability or assoc  purulent sputum or mucus plugs or hemoptysis or cp or chest tightness, subjective wheeze or overt sinus or hb symptoms.    Also denies any obvious fluctuation of symptoms with weather or environmental changes or other aggravating or alleviating factors except as outlined above   No unusual exposure hx or h/o childhood pna/ asthma or knowledge of premature birth.  Current Allergies, Complete Past Medical History, Past Surgical History, Family History, and Social History were reviewed in [employer] record.  ROS  The following are not active complaints unless bolded Hoarseness, sore throat, dysphagia, dental problems, itching, sneezing,  nasal congestion or discharge of excess mucus or purulent secretions, ear ache,   fever, chills, sweats, unintended wt loss or wt gain, classically pleuritic or exertional cp,  orthopnea pnd or arm/hand swelling  or leg swelling, presyncope, palpitations, abdominal pain, anorexia, nausea, vomiting, diarrhea  or change in bowel habits or change in bladder habits, change in stools or change in urine, dysuria, hematuria,  rash, arthralgias, visual complaints, headache, numbness, weakness or ataxia or problems with walking or coordination,  change in mood or  memory.        Current Meds  Medication Sig   ALPHAGAN P 0.1 % SOLN Place 1 drop into the left eye 2 (two) times daily.    atorvastatin (LIPITOR) 40 MG tablet Take 40 mg by mouth daily.   Azelastine-Fluticasone (DYMISTA) 137-50 MCG/ACT SUSP Place 1 puff into the nose 2 (two) times daily.   Biotin 10 MG CAPS Take 1 tablet by mouth daily. 1 tablet   Calcium Carbonate Antacid (TUMS ULTRA 1000 PO) Take  3,000 mg by mouth daily.    cetirizine (ZYRTEC) 10 MG tablet Take 10 mg by mouth daily.   diclofenac (VOLTAREN) 75 MG EC tablet Take 1 tablet (75 mg total) by mouth 2 (two) times daily.   dorzolamide-timolol (COSOPT) 22.3-6.8 MG/ML ophthalmic solution Place 1 drop into the left eye 2 (two) times daily.   famotidine (PEPCID) 20 MG tablet Take 20 mg by mouth daily.   fish oil-omega-3 fatty acids 1000 MG capsule Take 3 g by mouth daily.    HYDROCORTISONE ACE, RECTAL, 30 MG SUPP Place rectally.   ketorolac (ACULAR) 0.4 % SOLN Apply 1  drop to eye 4 (four) times daily.   Multiple Vitamin (MULTIVITAMIN) tablet Take 1 tablet by mouth daily.   omeprazole (PRILOSEC) 20 MG capsule Take 20 mg by mouth daily.   pilocarpine (PILOCAR) 2 % ophthalmic solution Apply to eye as needed.    prednisoLONE acetate (PRED FORTE) 1 % ophthalmic suspension Place 1 drop into the left eye daily.   tazarotene (AVAGE) 0.1 % cream Apply 1 application topically at bedtime.   telmisartan-hydrochlorothiazide (MICARDIS HCT) 40-12.5 MG tablet Take 1 tablet by mouth daily.   traMADol (ULTRAM) 50 MG tablet Take 1 tablet by mouth twice daily             Objective:       07/29/2021          128  07/29/2020          125   01/25/19 126 lb 9.6 oz (57.4 kg)  12/26/18 126 lb 12.8 oz (57.5 kg)  10/07/18 125 lb 6.4 oz (56.9 kg)    Vital signs reviewed  07/29/2021  - Note at rest 02 sats  98% on RA   General appearance:    pleasant amb wf nad      HEENT : pt wearing mask not removed for exam due to covid -19 concerns.    NECK :  without JVD/Nodes/TM/ nl carotid upstrokes bilaterally   LUNGS: no acc muscle use,  scoliotic contour chest whwith insp crackles bases bilaterally without cough on insp or exp maneuvers   CV:  RRR  no s3  2/6 sem s  increase in P2, and no edema   ABD:  soft and nontender with nl inspiratory excursion in the supine position. No bruits or organomegaly appreciated, bowel sounds nl  MS:  Nl gait/ ext  warm without deformities, calf tenderness, cyanosis or clubbing No obvious joint restrictions   SKIN: warm and dry without lesions    NEURO:  alert, approp, nl sensorium with  no motor or cerebellar deficits apparent.        CXR PA and Lateral:   07/29/2021 :    I personally reviewed images   impression as follows:    No significant interval change      Assessment

## 2021-07-29 NOTE — Patient Instructions (Addendum)
GERD (REFLUX)  is an extremely common cause of respiratory symptoms just like yours , many times with no obvious heartburn at all.    It can be treated with medication, but also with lifestyle changes including elevation of the head of your bed (ideally with 6 -8inch blocks under the headboard of your bed),  Smoking cessation, avoidance of late meals, excessive alcohol, and avoid fatty foods, chocolate, peppermint, colas, red wine, and acidic juices such as orange juice.  NO MINT OR MENTHOL PRODUCTS SO NO COUGH DROPS  USE SUGARLESS CANDY INSTEAD (Jolley ranchers or Stover's or Life Savers) or even ice chips will also do - the key is to swallow to prevent all throat clearing. NO OIL BASED VITAMINS - use powdered substitutes.  Avoid fish oil when coughing.         Make sure you check your oxygen saturation at your highest level of activity to be sure it stays over 90% and keep track of it at least once a week, more often if breathing getting worse, and let me know if losing ground.   Please schedule a follow up visit in 12 months but call sooner if needed

## 2021-07-30 ENCOUNTER — Encounter: Payer: Self-pay | Admitting: Internal Medicine

## 2021-07-30 ENCOUNTER — Encounter: Payer: Self-pay | Admitting: *Deleted

## 2021-07-30 NOTE — Assessment & Plan Note (Addendum)
Assoc with scoliosis/ S/p R breast RT 2009 with nl baseline cxr 01/27/18 and new plain cxr changes RLL post basal segment 08/26/18  - CT chest with contrast 01/24/2019  1. Spectrum of findings within the right lung including bronchiectasis airspace consolidation and tree-in-bud nodularity identified. Imaging findings are favored to represent sequelae of chronic, atypical infection such as MAI. Recurrent aspiration is also a differential consideration. - Quant Ig's  01/25/2019  Ok - Alpha one screen 01/25/2019  MZ Level 97  - PFT's  07/26/2019  FEV1 1.95 (95 % ) ratio 0.76  p 7 % improvement from saba p no prior to study with DLCO  120.26  (110%) corrects to 4.62 (111%)  for alv volume and FV curve nl    Clinically doing well so will hold off f/u pfts for now and encourage her to continue walks/ monitor sats and return in one year.   I did review importance of gerd diet/ HOB elevation with bed blocks given then assoc of noct gerd with bronchiectasis          Each maintenance medication was reviewed in detail including emphasizing most importantly the difference between maintenance and prns and under what circumstances the prns are to be triggered using an action plan format where appropriate.  Total time for H and P, chart review, counseling, reviewing   and generating customized AVS unique to this office visit / same day charting = 24 min

## 2021-08-01 ENCOUNTER — Encounter: Payer: Self-pay | Admitting: Physical Medicine & Rehabilitation

## 2021-08-01 ENCOUNTER — Encounter: Payer: PPO | Attending: Physical Medicine & Rehabilitation | Admitting: Physical Medicine & Rehabilitation

## 2021-08-01 ENCOUNTER — Other Ambulatory Visit: Payer: Self-pay

## 2021-08-01 VITALS — BP 123/71 | HR 56 | Temp 98.1°F | Ht 63.0 in | Wt 128.0 lb

## 2021-08-01 DIAGNOSIS — M47817 Spondylosis without myelopathy or radiculopathy, lumbosacral region: Secondary | ICD-10-CM | POA: Diagnosis not present

## 2021-08-01 MED ORDER — TRAMADOL HCL 50 MG PO TABS: 50.0000 mg | ORAL_TABLET | Freq: Two times a day (BID) | ORAL | 5 refills | Status: DC

## 2021-08-01 NOTE — Progress Notes (Signed)
Left L5 dorsal ramus., left L4 and left L3 medial branch radio frequency neurotomy under fluoroscopic guidance  Indication: Low back pain due to lumbar spondylosis which has been relieved on 2 occasions by greater than 50% by lumbar medial branch blocks at corresponding levels.  Informed consent was obtained after describing risks and benefits of the procedure with the patient, this includes bleeding, bruising, infection, paralysis and medication side effects. The patient wishes to proceed and has given written consent. The patient was placed in a prone position. The lumbar and sacral area was marked and prepped with Betadine. A 25-gauge 1-1/2 inch needle was inserted into the skin and subcutaneous tissue at 3 sites in one ML of 2% lidocaine was injected into each site. Then a 18-gauge 10 cm radio frequency needle with a 1 cm curved active tip was inserted targeting the left S1 SAP/sacral ala junction. Bone contact was made and confirmed with lateral imaging.  motor stimulation at 2 Hz confirm proper needle location followed by injection of one ML of 2% MPF lidocaine. Then the left L5 SAP/transverse process junction was targeted. Bone contact was made and confirmed with lateral imaging motor stimulation at 2 Hz confirm proper needle location followed by injection of one ML of the solution containing one ML of  2% MPF lidocaine. Then the left L4 SAP/transverse process junction was targeted. Bone contact was made and confirmed with lateral imaging. motor stimulation at 2 Hz confirm proper needle location followed by injection of one ML of the solution containing one ML of2% MPF lidocaine. Radio frequency lesion  at 80C for 90 seconds was performed. Needles were removed. Post procedure instructions and vital signs were performed. Patient tolerated procedure well. Followup appointment was given.  

## 2021-08-01 NOTE — Progress Notes (Signed)
  PROCEDURE RECORD [facility]   Name: Sandra Tapia DOB:11-20-1946 MRN: [id number]  Date:08/01/2021  Physician: Alysia Penna, MD    Nurse/CMA: Veldon Wager, CMA   Allergies:  Allergies  Allergen Reactions   Penicillins     Nausea & stomach pains   Adhesive [Tape] Rash    Gel grounding pads    Consent Signed: Yes.    Is patient diabetic? No.  CBG today? .   Pregnant: No. LMP: No LMP recorded. Patient is postmenopausal. (age 20-55)  Anticoagulants: no Anti-inflammatory: no Antibiotics: no  Procedure: left L3, 4, 5 radiofrequency  Position: Prone Start Time: 1:49pm  End Time: 2:04pm  Fluoro Time: 42s  RN/CMA Antonious Omahoney, CMA Ellanora Rayborn, CMA    Time 1:25pm 2:10pm    BP 123/71 141/74    Pulse 56 56    Respirations 16 16    O2 Sat 94 96    S/S 6 6    Pain Level 4/10 0/10     D/C home with husband, [name], D/C instructions reviewed, and sits independently.

## 2021-08-01 NOTE — Patient Instructions (Signed)
You had a radio frequency procedure today This was done to alleviate joint pain in your lumbar area We injected lidocaine which is a local anesthetic.  You may experience soreness at the injection sites. You may also experienced some irritation of the nerves that were heated I'm recommending ice for 30 minutes every 2 hours as needed for the next 24-48 hours   

## 2021-09-11 DIAGNOSIS — L820 Inflamed seborrheic keratosis: Secondary | ICD-10-CM | POA: Diagnosis not present

## 2021-09-25 DIAGNOSIS — L820 Inflamed seborrheic keratosis: Secondary | ICD-10-CM | POA: Diagnosis not present

## 2021-10-07 ENCOUNTER — Other Ambulatory Visit: Payer: Self-pay

## 2021-10-07 ENCOUNTER — Encounter: Payer: Self-pay | Admitting: Physical Medicine & Rehabilitation

## 2021-10-07 ENCOUNTER — Encounter: Payer: PPO | Attending: Physical Medicine & Rehabilitation | Admitting: Physical Medicine & Rehabilitation

## 2021-10-07 VITALS — Temp 98.0°F | Ht 63.0 in | Wt 126.0 lb

## 2021-10-07 DIAGNOSIS — M47817 Spondylosis without myelopathy or radiculopathy, lumbosacral region: Secondary | ICD-10-CM

## 2021-10-07 NOTE — Progress Notes (Signed)
RightL5 dorsal ramus., Right L4  radio frequency neurotomy under fluoroscopic guidance   Indication: Low back pain due to lumbar spondylosis which has been relieved on 2 occasions by greater than 50% by lumbar medial branch blocks at corresponding levels.  Informed consent was obtained after describing risks and benefits of the procedure with the patient, this includes bleeding, bruising, infection, paralysis and medication side effects. The patient wishes to proceed and has given written consent. The patient was placed in a prone position. The lumbar and sacral area was marked and prepped with Betadine. A 25-gauge 1-1/2 inch needle was inserted into the skin and subcutaneous tissue at 3 sites in one ML of 1% lidocaine was injected into each site. Then a 18-gauge 10 cm radio frequency needle with a 1 cm curved active tip was inserted targeting the Right S1 SAP/sacral ala junction. Bone contact was made and confirmed with lateral imaging.  motor stimulation at 2 Hz confirm proper needle location followed by injection of 27ml 2% MPF lidocaine. Then the Right L5 SAP/transverse process junction was targeted. Bone contact was made and confirmed with lateral imaging.  motor stimulation at 2 Hz confirm proper needle location followed by injection of 19ml 2% MPF lidocaine. Then the Right L4 SAP/transverse process junction was targeted. Bone contact was made and confirmed with lateral imaging. motor stimulation at 2 Hz caused hip pain that did not improve after needle repositioning therefore needle was removed Radio frequency lesion being at [facility] for 90 seconds was performed. Needles were removed. Post procedure instructions and vital signs were performed. Patient tolerated procedure well. Followup appointment was given.

## 2021-10-07 NOTE — Progress Notes (Signed)
  PROCEDURE RECORD [facility]   Name: [name] DOB:[birth year] MRN: [id number]  Date:10/07/2021  Physician: Alysia Penna, MD    Nurse/CMA: Jorja Loa MA  Allergies:  Allergies  Allergen Reactions   Penicillins     Nausea & stomach pains   Adhesive [Tape] Rash    Gel grounding pads    Consent Signed: Yes.    Is patient diabetic? No.  CBG today? N/A  Pregnant: No. LMP: No LMP recorded. Patient is postmenopausal. (age 75-55)  Anticoagulants: no Anti-inflammatory: no Antibiotics: no  Procedure:  Right L3,4,5 Radiofrequency Position: Prone Start Time: 1:13 PM  End Time: 1:43 PM  Fluoro Time: 55.3 RN/CMA Cella Cappello MA Andre Gallego MA    Time 1:03 PM 1:46 PM    BP 146/79 160/80    Pulse 56 58    Respirations 16 16    O2 Sat 97 98    S/S 6 6    Pain Level 7/10 0/10     D/C home with [name] (Spouse), [name], D/C instructions reviewed, and sits independently.

## 2021-10-10 ENCOUNTER — Ambulatory Visit: Payer: PPO | Admitting: Physical Medicine & Rehabilitation

## 2021-10-10 DIAGNOSIS — H33002 Unspecified retinal detachment with retinal break, left eye: Secondary | ICD-10-CM | POA: Diagnosis not present

## 2021-10-10 DIAGNOSIS — H35431 Paving stone degeneration of retina, right eye: Secondary | ICD-10-CM | POA: Diagnosis not present

## 2021-10-10 DIAGNOSIS — H353132 Nonexudative age-related macular degeneration, bilateral, intermediate dry stage: Secondary | ICD-10-CM | POA: Diagnosis not present

## 2021-10-10 DIAGNOSIS — Z961 Presence of intraocular lens: Secondary | ICD-10-CM | POA: Diagnosis not present

## 2021-10-10 DIAGNOSIS — H26491 Other secondary cataract, right eye: Secondary | ICD-10-CM | POA: Diagnosis not present

## 2021-10-10 DIAGNOSIS — H43391 Other vitreous opacities, right eye: Secondary | ICD-10-CM | POA: Diagnosis not present

## 2021-10-10 DIAGNOSIS — H35352 Cystoid macular degeneration, left eye: Secondary | ICD-10-CM | POA: Diagnosis not present

## 2021-10-17 DIAGNOSIS — M85852 Other specified disorders of bone density and structure, left thigh: Secondary | ICD-10-CM | POA: Diagnosis not present

## 2021-10-17 DIAGNOSIS — Z1389 Encounter for screening for other disorder: Secondary | ICD-10-CM | POA: Diagnosis not present

## 2021-10-17 DIAGNOSIS — Z0000 Encounter for general adult medical examination without abnormal findings: Secondary | ICD-10-CM | POA: Diagnosis not present

## 2021-10-17 DIAGNOSIS — I10 Essential (primary) hypertension: Secondary | ICD-10-CM | POA: Diagnosis not present

## 2021-10-17 DIAGNOSIS — E782 Mixed hyperlipidemia: Secondary | ICD-10-CM | POA: Diagnosis not present

## 2021-10-17 DIAGNOSIS — R7303 Prediabetes: Secondary | ICD-10-CM | POA: Diagnosis not present

## 2021-10-17 DIAGNOSIS — Z5181 Encounter for therapeutic drug level monitoring: Secondary | ICD-10-CM | POA: Diagnosis not present

## 2021-10-17 DIAGNOSIS — R7309 Other abnormal glucose: Secondary | ICD-10-CM | POA: Diagnosis not present

## 2021-10-24 DIAGNOSIS — E782 Mixed hyperlipidemia: Secondary | ICD-10-CM | POA: Diagnosis not present

## 2021-10-24 DIAGNOSIS — I10 Essential (primary) hypertension: Secondary | ICD-10-CM | POA: Diagnosis not present

## 2021-10-24 DIAGNOSIS — K5120 Ulcerative (chronic) proctitis without complications: Secondary | ICD-10-CM | POA: Diagnosis not present

## 2021-10-24 DIAGNOSIS — K219 Gastro-esophageal reflux disease without esophagitis: Secondary | ICD-10-CM | POA: Diagnosis not present

## 2021-10-24 DIAGNOSIS — R7309 Other abnormal glucose: Secondary | ICD-10-CM | POA: Diagnosis not present

## 2021-10-24 DIAGNOSIS — E559 Vitamin D deficiency, unspecified: Secondary | ICD-10-CM | POA: Diagnosis not present

## 2021-10-24 DIAGNOSIS — H409 Unspecified glaucoma: Secondary | ICD-10-CM | POA: Diagnosis not present

## 2021-10-24 DIAGNOSIS — K120 Recurrent oral aphthae: Secondary | ICD-10-CM | POA: Diagnosis not present

## 2021-10-24 DIAGNOSIS — L509 Urticaria, unspecified: Secondary | ICD-10-CM | POA: Diagnosis not present

## 2021-10-24 DIAGNOSIS — L719 Rosacea, unspecified: Secondary | ICD-10-CM | POA: Diagnosis not present

## 2021-10-24 DIAGNOSIS — M85852 Other specified disorders of bone density and structure, left thigh: Secondary | ICD-10-CM | POA: Diagnosis not present

## 2021-10-24 DIAGNOSIS — M5459 Other low back pain: Secondary | ICD-10-CM | POA: Diagnosis not present

## 2021-10-30 ENCOUNTER — Other Ambulatory Visit: Payer: Self-pay

## 2021-10-30 ENCOUNTER — Ambulatory Visit (HOSPITAL_COMMUNITY): Payer: PPO | Attending: Cardiovascular Disease

## 2021-10-30 ENCOUNTER — Ambulatory Visit (INDEPENDENT_AMBULATORY_CARE_PROVIDER_SITE_OTHER): Payer: PPO | Admitting: Cardiology

## 2021-10-30 ENCOUNTER — Encounter: Payer: Self-pay | Admitting: Cardiology

## 2021-10-30 VITALS — BP 130/70 | HR 53 | Ht 63.0 in | Wt 130.0 lb

## 2021-10-30 DIAGNOSIS — I77810 Thoracic aortic ectasia: Secondary | ICD-10-CM

## 2021-10-30 DIAGNOSIS — I10 Essential (primary) hypertension: Secondary | ICD-10-CM

## 2021-10-30 DIAGNOSIS — J479 Bronchiectasis, uncomplicated: Secondary | ICD-10-CM | POA: Diagnosis not present

## 2021-10-30 NOTE — Patient Instructions (Signed)
Medication Instructions:  The current medical regimen is effective;  continue present plan and medications.  *If you need a refill on your cardiac medications before your next appointment, please call your pharmacy*  Follow-Up: At [facility], you and your health needs are our priority.  As part of our continuing mission to provide you with exceptional heart care, we have created designated Provider Care Teams.  These Care Teams include your primary Cardiologist (physician) and Advanced Practice Providers (APPs -  Physician Assistants and Nurse Practitioners) who all work together to provide you with the care you need, when you need it.  We recommend signing up for the patient portal called "MyChart".  Sign up information is provided on this After Visit Summary.  MyChart is used to connect with patients for Virtual Visits (Telemedicine).  Patients are able to view lab/test results, encounter notes, upcoming appointments, etc.  Non-urgent messages can be sent to your provider as well.   To learn more about what you can do with MyChart, go to [email or web address].    Your next appointment:   1 year(s)  The format for your next appointment:   In Person  Provider:   Mark Skains, MD   Thank you for choosing [facility]!!    

## 2021-10-30 NOTE — Assessment & Plan Note (Signed)
Echocardiogram preliminary look stable from before.  Range has been between 42 and 44 mm.  Discussed with them in clinic.

## 2021-10-30 NOTE — Assessment & Plan Note (Signed)
Right basilar crackles heard on exam.  Bronchiectasis.  Certainly this is also contributing to some of the shortness of breath that she is noticing during walks or her husband is noticing.  It is not severe.  Continue to monitor this closely however given her valvular abnormalities.

## 2021-10-30 NOTE — Assessment & Plan Note (Signed)
Excellent control.  No changes made.  Continue with HCTZ and amlodipine.

## 2021-10-30 NOTE — Progress Notes (Signed)
[facility] [address] [place],   [address] Phone: [phone] Fax:  [phone]  Date:  10/30/2021   ID:  [place], [place] July 19, 1946, MRN [id number]  PCP:  Cari Caraway, MD   History of Present Illness: Sandra Tapia is a 75 y.o. female here for follow up for dilated aortic root.   with aortic root of 4.1 -> 4.3 -> 4.35-->4.2 cm ascending aorta. She also has mild to moderate valvular disease. Prior MRI of chest demonstrated no evidence of coarctation. Normal ejection fraction.   Prior LDL cholesterol, she is on simvastatin, was under good control just above 100. She is also a recent breast cancer survivor with XRT/lumpectomy. No chemotherapy other than tamoxifen.  Prior exercise treadmill test reassuring. This was performed because of increasing dyspnea on exertion. She is no longer complaining of this symptom.  She will occasionally feel fluttering sensation, fleeting, periodic. No associated chest pain, shortness of breath. No syncope.  08/22/2018- sometimes feels some dizziness at times, some fatigue.  No chest pain, no syncope, no orthopnea, no PND.  09/28/2019- here for follow-up of dilated aortic root.  I have seen Dr. Gustavus Bryant note, bronchiectasis.  Overall doing well with as needed Mucinex.  No chest pain fevers chills nausea vomiting syncope.  Aortic root 4.1 cm on CT scan.   Today she is accompanied with a family member.  Overall she says that she feels fine.   They report that she has worsening shortness of breath for the last six months.This has been noticeable while walking 2 miles every morning.   She endorses scarring from previous radiation therapy which may have contributed to the bronchiectasis.    Sometimes she complains of numbness, tingling, and coldness in her left lower extremity.  She undergoes neurotomies and for management of her back pain.  She denies any palpitations and chest pain. No lightheadedness, headaches,  syncope, orthopnea, PND, or lower extremity edema.  Wt Readings from Last 3 Encounters:  10/30/21 130 lb (59 kg)  10/07/21 126 lb (57.2 kg)  08/01/21 128 lb (58.1 kg)     Past Medical History:  Diagnosis Date   Aneurysm of aorta ([place]) 8/09   4.1cm ascending aortic aneurysm on breast MRI   Basal cell cancer    Cancer ([place]) 07/2008   breast cancer(rt)   Degenerative disc disease    neck   Elevated cholesterol    Fibroid    Hypertension    Infertility, female    Spinal arthritis     Past Surgical History:  Procedure Laterality Date   ANKLE SURGERY     BREAST SURGERY  8/09   (Rt)Lumpectomy,sentinel node   EYE SURGERY     MASTECTOMY, PARTIAL Right 07/31/08   right node Bx (neg)   RETINAL DETACHMENT SURGERY  june 28th 2012   TONSILLECTOMY AND ADENOIDECTOMY      Current Outpatient Medications  Medication Sig Dispense Refill   ALPHAGAN P 0.1 % SOLN Place 1 drop into the left eye 2 (two) times daily.      amLODipine (NORVASC) 5 MG tablet Take 5 mg by mouth daily.     atorvastatin (LIPITOR) 40 MG tablet Take 40 mg by mouth daily.     Azelastine HCl 0.15 % SOLN SMARTSIG:1 Spray(s) Both Nares Twice Daily PRN     Azelastine-Fluticasone (DYMISTA) 137-50 MCG/ACT SUSP Place 1 puff into the nose 2 (two) times daily. 23 g 11   Biotin 10 MG CAPS Take  1 tablet by mouth daily. 1 tablet     Calcium Carbonate Antacid (TUMS ULTRA 1000 PO) Take 3,000 mg by mouth daily.      cetirizine (ZYRTEC) 10 MG tablet Take 10 mg by mouth daily.     diclofenac (VOLTAREN) 75 MG EC tablet Take 1 tablet (75 mg total) by mouth 2 (two) times daily. 60 tablet 2   dorzolamide-timolol (COSOPT) 22.3-6.8 MG/ML ophthalmic solution Apply to eye.     hydrochlorothiazide (HYDRODIURIL) 12.5 MG tablet Take 12.5 mg by mouth daily.     hydrocortisone (CORTENEMA) 100 MG/60ML enema Place 1 enema rectally at bedtime.     HYDROCORTISONE ACE, RECTAL, 30 MG SUPP Place rectally.     ketorolac (ACULAR) 0.5 % ophthalmic solution  Apply to eye.     Multiple Vitamin (MULTIVITAMIN) tablet Take 1 tablet by mouth daily.     omeprazole (PRILOSEC) 40 MG capsule Take 40 mg by mouth every morning.     pilocarpine (PILOCAR) 2 % ophthalmic solution Apply to eye.     traMADol (ULTRAM) 50 MG tablet Take 1 tablet (50 mg total) by mouth 2 (two) times daily. 60 tablet 5   No current facility-administered medications for this visit.    Allergies:    Allergies  Allergen Reactions   Penicillins     Nausea & stomach pains   Adhesive [Tape] Rash    Gel grounding pads    Social History:  The patient  reports that she has never smoked. She has never used smokeless tobacco. She reports that she does not currently use alcohol after a past usage of about 7.0 standard drinks per week. She reports that she does not use drugs.   Family History  Problem Relation Age of Onset   Hypertension Father    Macular degeneration Father     ROS:  Please see the history of present illness. (+)Shortness of breath (+) numbness, tingling, and coldness in LLE (+) Back Pain All other systems are reviewed and negative.     PHYSICAL EXAM: VS:  BP 130/70 (BP Location: Left Arm, Patient Position: Sitting, Cuff Size: Normal)   Pulse (!) 53   Ht 5\' 3"  (1.6 m)   Wt 130 lb (59 kg)   SpO2 98%   BMI 23.03 kg/m  GEN: Thin, in no acute distress  HEENT: normal  Neck: no JVD, slightly accentuated pulsatility to her carotids, no bruits, or masses Cardiac: RRR; soft systolic murmurs, no rubs, or gallops,no edema  Respiratory:  Crackles, normal work of breathing GI: soft, nontender, nondistended, + BS MS: no deformity or atrophy  Skin: warm and dry, no rash Neuro:  Alert and Oriented x 3, Strength and sensation are intact Psych: euthymic mood, full affect   EKG: EKG is personally reviewed and interpreted.    10/30/2021: Sinus bradycardia, Rate 53 bpm. 09/28/2019-sinus bradycardia 54 with no other abnormalities personally reviewed  08/22/2018-sinus  bradycardia 58 with no other abnormalities.   08/18/16-sinus rhythm 63 with nonspecific ST-T wave changes, no change from prior personally viewed-,  08/19/15-sinus rhythm, 64, nonspecific ST-T wave changes- 08/17/14-sinus rhythm, no other abnormalities    ECHO: 08/18/16 . - Aorta: Ascending aortic diameter: 44mm (S). - Ascending aorta: The ascending aorta was mildly dilated.  Echocardiogram 08/22/2018  -Ascending aortic root 44 mm  -Mild to moderate aortic regurgitation.  -Normal EF  CT chest 01/24/19: 1. Spectrum of findings within the right lung including bronchiectasis airspace consolidation and tree-in-bud nodularity identified. Imaging findings are favored to represent sequelae  of chronic, atypical infection such as MAI. Recurrent aspiration is also a differential consideration. Recommend follow-up imaging after appropriate therapy with repeat CT of the chest to assess for any temporal change in the appearance of the lungs. 2. Aortic atherosclerosis and mild aneurysmal dilatation of the ascending thoracic aorta 4.1cm. Recommend annual imaging followup by CTA or MRA. This recommendation follows 2010 ACCF/AHA/AATS/ACR/ASA/SCA/SCAI/SIR/STS/SVM Guidelines for the Diagnosis and Management of Patients with Thoracic Aortic Disease. Circulation. 2010; 121: T017-B939. Aortic aneurysm NOS (ICD10-I71.9). Aortic Atherosclerosis (ICD10-I70.0).  ASSESSMENT AND PLAN:  Dilated aortic root Echocardiogram preliminary look stable from before.  Range has been between 42 and 44 mm.  Discussed with them in clinic.  Bronchiectasis without complication ([place]) Right basilar crackles heard on exam.  Bronchiectasis.  Certainly this is also contributing to some of the shortness of breath that she is noticing during walks or her husband is noticing.  It is not severe.  Continue to monitor this closely however given her valvular abnormalities.  Essential hypertension Excellent control.  No changes made.   Continue with HCTZ and amlodipine.   Once formal results are in on echocardiogram that was done today, we will report to patient.  F/U in one year.  I,Mathew Stumpf,acting as a Education administrator for [employer], MD.,have documented all relevant documentation on the behalf of Candee Furbish, MD,as directed by  Candee Furbish, MD while in the presence of Candee Furbish, MD.   I, Candee Furbish, MD, have reviewed all documentation for this visit. The documentation on 10/30/21 for the exam, diagnosis, procedures, and orders are all accurate and complete.   Signed, Candee Furbish, MD [facility]  10/30/2021 5:25 PM

## 2021-10-31 LAB — ECHOCARDIOGRAM COMPLETE
Area-P 1/2: 2.31 cm2
MV M vel: 5.7 m/s
MV Peak grad: 130 mmHg
P 1/2 time: 770 msec
Radius: 0.6 cm
S' Lateral: 3.1 cm

## 2021-11-03 DIAGNOSIS — E782 Mixed hyperlipidemia: Secondary | ICD-10-CM | POA: Diagnosis not present

## 2021-11-03 DIAGNOSIS — G8929 Other chronic pain: Secondary | ICD-10-CM | POA: Diagnosis not present

## 2021-11-03 DIAGNOSIS — I10 Essential (primary) hypertension: Secondary | ICD-10-CM | POA: Diagnosis not present

## 2021-11-03 DIAGNOSIS — H409 Unspecified glaucoma: Secondary | ICD-10-CM | POA: Diagnosis not present

## 2021-11-03 DIAGNOSIS — K219 Gastro-esophageal reflux disease without esophagitis: Secondary | ICD-10-CM | POA: Diagnosis not present

## 2021-11-03 DIAGNOSIS — M150 Primary generalized (osteo)arthritis: Secondary | ICD-10-CM | POA: Diagnosis not present

## 2021-11-03 DIAGNOSIS — J479 Bronchiectasis, uncomplicated: Secondary | ICD-10-CM | POA: Diagnosis not present

## 2021-11-03 DIAGNOSIS — Z853 Personal history of malignant neoplasm of breast: Secondary | ICD-10-CM | POA: Diagnosis not present

## 2021-11-06 DIAGNOSIS — Z1231 Encounter for screening mammogram for malignant neoplasm of breast: Secondary | ICD-10-CM | POA: Diagnosis not present

## 2021-11-27 ENCOUNTER — Other Ambulatory Visit: Payer: Self-pay

## 2021-11-27 ENCOUNTER — Encounter: Payer: Self-pay | Admitting: Physical Medicine & Rehabilitation

## 2021-11-27 ENCOUNTER — Encounter: Payer: PPO | Attending: Physical Medicine & Rehabilitation | Admitting: Physical Medicine & Rehabilitation

## 2021-11-27 VITALS — BP 154/79 | HR 64 | Ht 63.0 in | Wt 127.4 lb

## 2021-11-27 DIAGNOSIS — G894 Chronic pain syndrome: Secondary | ICD-10-CM | POA: Diagnosis not present

## 2021-11-27 DIAGNOSIS — Z79891 Long term (current) use of opiate analgesic: Secondary | ICD-10-CM | POA: Diagnosis not present

## 2021-11-27 DIAGNOSIS — Z5181 Encounter for therapeutic drug level monitoring: Secondary | ICD-10-CM | POA: Diagnosis not present

## 2021-11-27 NOTE — Progress Notes (Signed)
Subjective:    Patient ID: Sandra Tapia, female    DOB: 1946-01-05, 75 y.o.   MRN: [id number]  HPI CC: Left > Right hip pain 75 year old female with history of lumbar spondylosis without myelopathy who has had good relief with lumbar medial branch blocks followed by lumbar radiofrequency neurotomy returns today with left posterior hip pain she has had this pain before but feels a little bit more intense.  She has had no recent trauma. Pain mainly with sitting, relief with standing and walking , has used heat  Bilateral sacroiliac injections under fluoroscopic guidance last performed 10/29/2020   RightL5 dorsal ramus., Right L4  radio frequency neurotomy under fluoroscopic guidance last performed 10/07/2021 Pain Inventory Average Pain 7 Pain Right Now 6 My pain is sharp, burning, dull, and aching  In the last 24 hours, has pain interfered with the following? General activity 7 Relation with others 6 Enjoyment of life 5 What TIME of day is your pain at its worst? daytime Sleep (in general) Good  Pain is worse with: sitting, inactivity, standing, and some activites Pain improves with: rest, therapy/exercise, pacing activities, medication, and injections Relief from Meds: 7  Family History  Problem Relation Age of Onset   Hypertension Father    Macular degeneration Father    Social History   Socioeconomic History   Marital status: Married    Spouse name: Not on file   Number of children: Not on file   Years of education: Not on file   Highest education level: Not on file  Occupational History   Not on file  Tobacco Use   Smoking status: Never   Smokeless tobacco: Never  Vaping Use   Vaping Use: Never used  Substance and Sexual Activity   Alcohol use: Not Currently    Alcohol/week: 7.0 standard drinks    Types: 7 drink(s) per week    Comment: 6-7 glasses of wine a week   Drug use: No   Sexual activity: Yes    Partners: Male    Birth control/protection:  Post-menopausal  Other Topics Concern   Not on file  Social History Narrative   Not on file   Social Determinants of Health   Financial Resource Strain: Not on file  Food Insecurity: Not on file  Transportation Needs: Not on file  Physical Activity: Not on file  Stress: Not on file  Social Connections: Not on file   Past Surgical History:  Procedure Laterality Date   ANKLE SURGERY     BREAST SURGERY  8/09   (Rt)Lumpectomy,sentinel node   EYE SURGERY     MASTECTOMY, PARTIAL Right 07/31/08   right node Bx (neg)   RETINAL DETACHMENT SURGERY  june 28th 2012   TONSILLECTOMY AND ADENOIDECTOMY     Past Surgical History:  Procedure Laterality Date   ANKLE SURGERY     BREAST SURGERY  8/09   (Rt)Lumpectomy,sentinel node   EYE SURGERY     MASTECTOMY, PARTIAL Right 07/31/08   right node Bx (neg)   RETINAL DETACHMENT SURGERY  june 28th 2012   TONSILLECTOMY AND ADENOIDECTOMY     Past Medical History:  Diagnosis Date   Aneurysm of aorta ([place]) 8/09   4.1cm ascending aortic aneurysm on breast MRI   Basal cell cancer    Cancer ([place]) 07/2008   breast cancer(rt)   Degenerative disc disease    neck   Elevated cholesterol    Fibroid    Hypertension    Infertility, female  Spinal arthritis    BP (!) 154/79   Pulse 64   Ht 5\' 3"  (1.6 m)   Wt 127 lb 6.4 oz (57.8 kg)   SpO2 98%   BMI 22.57 kg/m   Opioid Risk Score:   Fall Risk Score:  `1  Depression screen PHQ 2/9  Depression screen [facility] 2/9 11/27/2021 07/16/2020 05/02/2018 03/12/2016 02/13/2016 07/22/2015  Decreased Interest 0 0 0 0 0 0  Down, Depressed, Hopeless 0 0 0 0 0 0  PHQ - 2 Score 0 0 0 0 0 0     Review of Systems  Constitutional: Negative.   HENT: Negative.    Eyes: Negative.   Respiratory: Negative.    Cardiovascular: Negative.   Gastrointestinal: Negative.   Endocrine: Negative.   Genitourinary: Negative.   Musculoskeletal:  Positive for back pain.  Skin: Negative.   Allergic/Immunologic: Negative.    Neurological: Negative.   Hematological: Negative.   Psychiatric/Behavioral: Negative.    All other systems reviewed and are negative.     Objective:   Physical Exam Vitals and nursing note reviewed.  Constitutional:      Appearance: She is normal weight.  HENT:     Head: Normocephalic and atraumatic.  Eyes:     Extraocular Movements: Extraocular movements intact.     Conjunctiva/sclera: Conjunctivae normal.     Pupils: Pupils are equal, round, and reactive to light.  Musculoskeletal:     Comments: Lumbar scoliosis No tenderness over the lumbar paraspinal area Positive tenderness over PSIS area left greater than right No tenderness over the greater trochanter Faber's positive SI (right side negative at the hip joint Positive thigh thrust test left greater than right Positive sacral thrust  Skin:    General: Skin is warm and dry.  Neurological:     Mental Status: She is alert and oriented to person, place, and time.  Psychiatric:        Mood and Affect: Mood normal.        Behavior: Behavior normal.   Motor strength is 5/5 bilateral hip flexor knee extensor ankle dorsiflexor Gait without evidence of toe drag or knee instability       Assessment & Plan:  #1.  History lumbar spondylosis without myelopathy she also has a history of significant lumbar scoliosis.  She has done well with radiofrequency neurotomies last performed in the October 2022.  Will monitor her for pain recurrence  2.  Left hip pain this is most consistent with sacroiliac pain that has recurred likely has been subclinical and now more noticeable after radiofrequency neurotomy which has relieved her lumbar pain. Continue tramadol 50 mg 2 times daily  Repeat sacroiliac injections under fluoroscopic guidance next available appointment

## 2021-12-01 DIAGNOSIS — L820 Inflamed seborrheic keratosis: Secondary | ICD-10-CM | POA: Diagnosis not present

## 2021-12-04 ENCOUNTER — Telehealth: Payer: Self-pay | Admitting: *Deleted

## 2021-12-04 LAB — TOXASSURE SELECT,+ANTIDEPR,UR

## 2021-12-04 NOTE — Telephone Encounter (Signed)
Urine drug screen for this encounter is consistent for prescribed medication 

## 2022-01-20 ENCOUNTER — Encounter: Payer: Self-pay | Admitting: Physical Medicine & Rehabilitation

## 2022-01-20 ENCOUNTER — Encounter: Payer: PPO | Attending: Physical Medicine & Rehabilitation | Admitting: Physical Medicine & Rehabilitation

## 2022-01-20 ENCOUNTER — Other Ambulatory Visit: Payer: Self-pay

## 2022-01-20 VITALS — BP 142/76 | HR 61 | Temp 97.9°F | Ht 63.0 in | Wt 127.0 lb

## 2022-01-20 DIAGNOSIS — M533 Sacrococcygeal disorders, not elsewhere classified: Secondary | ICD-10-CM | POA: Insufficient documentation

## 2022-01-20 NOTE — Progress Notes (Signed)
°  PROCEDURE RECORD [facility]   Name: Sandra Tapia DOB:[birth year] MRN: [id number]  Date:01/20/2022  Physician: Alysia Penna, MD    Nurse/CMA: Jorja Loa MA  Allergies:  Allergies  Allergen Reactions   Lisinopril     Other reaction(s): cough   Penicillins     Nausea & stomach pains Other reaction(s): severe stomach pain   Telmisartan     Other reaction(s): angioedema   Adhesive [Tape] Rash    Gel grounding pads    Consent Signed: Yes.    Is patient diabetic? No.  CBG today? N/A  Pregnant: No. LMP: No LMP recorded. Patient is postmenopausal. (age 20-55)  Anticoagulants: no Anti-inflammatory: no Antibiotics: no  Procedure:  Bilateral sacroiliac injections  Position: Prone Start Time: 1:51 pm  End Time:1:57 pm   Fluoro Time: 25  RN/CMA Jaymie Misch MA Taray Normoyle MA    Time 1:18 PM 2:00 PM    BP 142/76 139/74    Pulse 61 61    Respirations 16 16    O2 Sat 96 98    S/S 6 6    Pain Level 7/10 0/10     D/C home with Spouse, [name], D/C instructions reviewed, and sits independently.         Subjective:    Patient ID: Sandra Tapia, female    DOB: November 10, 1946, 76 y.o.   MRN: [id number]  HPI    Review of Systems     Objective:   Physical Exam        Assessment & Plan:

## 2022-01-20 NOTE — Progress Notes (Signed)

## 2022-01-20 NOTE — Patient Instructions (Signed)
Sacroiliac injection was performed today. A combination of numbing medicine (lidocaine) plus a cortisone medicine (betamethasone) was injected. The injection was done under x-ray guidance. This procedure has been performed to help reduce low back and buttocks pain as well as potentially hip pain. The duration of this injection is variable lasting from hours to  Months. It may repeated if needed. 

## 2022-01-23 DIAGNOSIS — Z961 Presence of intraocular lens: Secondary | ICD-10-CM | POA: Diagnosis not present

## 2022-01-23 DIAGNOSIS — H35352 Cystoid macular degeneration, left eye: Secondary | ICD-10-CM | POA: Diagnosis not present

## 2022-01-23 DIAGNOSIS — H35431 Paving stone degeneration of retina, right eye: Secondary | ICD-10-CM | POA: Diagnosis not present

## 2022-01-23 DIAGNOSIS — H33002 Unspecified retinal detachment with retinal break, left eye: Secondary | ICD-10-CM | POA: Diagnosis not present

## 2022-01-23 DIAGNOSIS — H40052 Ocular hypertension, left eye: Secondary | ICD-10-CM | POA: Diagnosis not present

## 2022-01-23 DIAGNOSIS — H353132 Nonexudative age-related macular degeneration, bilateral, intermediate dry stage: Secondary | ICD-10-CM | POA: Diagnosis not present

## 2022-02-26 DIAGNOSIS — L820 Inflamed seborrheic keratosis: Secondary | ICD-10-CM | POA: Diagnosis not present

## 2022-03-24 ENCOUNTER — Encounter: Payer: PPO | Attending: Physical Medicine & Rehabilitation | Admitting: Physical Medicine & Rehabilitation

## 2022-03-24 ENCOUNTER — Encounter: Payer: Self-pay | Admitting: Physical Medicine & Rehabilitation

## 2022-03-24 ENCOUNTER — Other Ambulatory Visit: Payer: Self-pay

## 2022-03-24 VITALS — BP 141/83 | HR 63 | Ht 63.0 in | Wt 127.2 lb

## 2022-03-24 DIAGNOSIS — M47817 Spondylosis without myelopathy or radiculopathy, lumbosacral region: Secondary | ICD-10-CM | POA: Diagnosis not present

## 2022-03-24 DIAGNOSIS — M47812 Spondylosis without myelopathy or radiculopathy, cervical region: Secondary | ICD-10-CM | POA: Diagnosis not present

## 2022-03-24 NOTE — Patient Instructions (Signed)
Trigger Point Injection Trigger points are areas where you have pain. A trigger point injection is a shot given in the trigger point to help relieve pain for a few days to a few months. Common places for trigger points include the neck, shoulders, upper back, or lower back. A trigger point injection will not cure long-term (chronic) pain permanently. These injections do not always work for every person. For some people, they can help to relieve pain for a few days to a few months. Tell a health care provider about: Any allergies you have. All medicines you are taking, including vitamins, herbs, eye drops, creams, and over-the-counter medicines. Any problems you or family members have had with anesthetic medicines. Any bleeding problems you have. Any surgeries you have had. Any medical conditions you have. Whether you are pregnant or may be pregnant. What are the risks? Generally, this is a safe procedure. However, problems may occur, including: Infection. Bleeding or bruising. Allergic reaction to the injected medicine. Irritation of the skin around the injection site. What happens before the procedure? Ask your health care provider about: Changing or stopping your regular medicines. This is especially important if you are taking diabetes medicines or blood thinners. Taking medicines such as aspirin and ibuprofen. These medicines can thin your blood. Do not take these medicines unless your health care provider tells you to take them. Taking over-the-counter medicines, vitamins, herbs, and supplements. What happens during the procedure?  Your health care provider will feel for trigger points. A marker may be used to circle the area for the injection. The skin over the trigger point will be washed with a germ-killing soap. You may be given a medicine to help you relax (sedative). A thin needle is used for the injection. You may feel pain or a twitching feeling when the needle enters your  skin. A numbing solution may be injected into the trigger point. Sometimes a medicine to keep down inflammation is also injected. Your health care provider may move the needle around the area where the trigger point is located until the tightness and twitching goes away. After the injection, your health care provider may put gentle pressure over the injection site. The injection site will be covered with a bandage (dressing). The procedure may vary among health care providers and hospitals. What can I expect after treatment? After treatment, you may have soreness and stiffness for 1-2 days. Follow these instructions at home: Injection site care Remove your dressing in a few hours, or as told by your health care provider. Check your injection site every day for signs of infection. Check for: Redness, swelling, or pain. Fluid or blood. Warmth. Pus or a bad smell. Managing pain, stiffness, and swelling If directed, put ice on the affected area. To do this: Put ice in a plastic bag. Place a towel between your skin and the bag. Leave the ice on for 20 minutes, 2-3 times a day. Remove the ice if your skin turns bright red. This is very important. If you cannot feel pain, heat, or cold, you have a greater risk of damage to the area. Activity If you were given a sedative during the procedure, it can affect you for several hours. Do not drive or operate machinery until your health care provider says that it is safe. Do not take baths, swim, or use a hot tub until your health care provider approves. Return to your normal activities as told by your health care provider. Ask your health care provider what   activities are safe for you. ?General instructions ?If you were asked to stop your regular medicines, ask your health care provider when you may start taking them again. ?You may be asked to see an occupational or physical therapist for exercises that reduce muscle strain and stretch the area of the  trigger point. ?Keep all follow-up visits. This is important. ?Contact a health care provider if: ?Your pain comes back, and it is worse than before the injection. You may need more injections. ?You have chills or a fever. ?The injection site becomes more painful, red, swollen, or warm to the touch. ?Summary ?A trigger point injection is a shot given in the trigger point to help relieve pain. ?Common places for trigger point injections are the neck, shoulders, upper back, and lower back. ?These injections do not always work for every person, but for some people, the injections can help to relieve pain for a few days to a few months. ?Contact a health care provider if symptoms come back or if they are worse than before treatment. Also, get help if the injection site becomes more painful, red, swollen, or warm to the touch. ?This information is not intended to replace advice given to you by your health care provider. Make sure you discuss any questions you have with your health care provider. ?Document Revised: 03/25/2021 Document Reviewed: 03/25/2021 ?Elsevier Patient Education ? [place]. ? ?

## 2022-03-24 NOTE — Progress Notes (Signed)
? ?Subjective:  ? ? Patient ID: Sandra Tapia, female    DOB: [birth year], 76 y.o.   MRN: [id number] ? ?HPI ?76 year old female with history of chronic back pain as well as chronic neck pain.  She has been treated in the past with cervical medial branch blocks on the right side at C4-5-6 in 2015.  She had long-term improvement in her pain.  She now complains of left-sided neck pain of 2 to 36-monthduration.  There is no radiating pain down the left upper extremity.  She denies any falls or trauma.  She has no right-sided neck pain. ?Her back is doing overall better she still has some pain in the left upper buttock area.  She did had sacroiliac joint injections performed on 01/20/2022 which helped with the right-sided buttock pain but not so much with the left. ?RightL5 dorsal ramus., Right L4  radio frequency neurotomy under fluoroscopic guidance last performed 10/07/2021 ? ?Left L5 dorsal ramus., left L4 and left L3 medial branch radio frequency neurotomy under fluoroscopic guidance 08/01/2021 ?Pain Inventory ?Average Pain 7 ?Pain Right Now 6 ?My pain is burning, dull, and aching ? ?In the last 24 hours, has pain interfered with the following? ?General activity 5 ?Relation with others 6 ?Enjoyment of life 6 ?What TIME of day is your pain at its worst? morning  ?Sleep (in general) Good ? ?Pain is worse with: sitting, standing, and some activites ?Pain improves with: rest, heat/ice, therapy/exercise, pacing activities, medication, and injections ?Relief from Meds:  na ? ?Family History  ?Problem Relation Age of Onset  ? Hypertension Father   ? Macular degeneration Father   ? ?Social History  ? ?Socioeconomic History  ? Marital status: Married  ?  Spouse name: Not on file  ? Number of children: Not on file  ? Years of education: Not on file  ? Highest education level: Not on file  ?Occupational History  ? Not on file  ?Tobacco Use  ? Smoking status: Never  ? Smokeless tobacco: Never  ?Vaping Use  ? Vaping Use: Never  used  ?Substance and Sexual Activity  ? Alcohol use: Not Currently  ?  Alcohol/week: 7.0 standard drinks  ?  Types: 7 drink(s) per week  ?  Comment: 6-7 glasses of wine a week  ? Drug use: No  ? Sexual activity: Yes  ?  Partners: Male  ?  Birth control/protection: Post-menopausal  ?Other Topics Concern  ? Not on file  ?Social History Narrative  ? Not on file  ? ?Social Determinants of Health  ? ?Financial Resource Strain: Not on file  ?Food Insecurity: Not on file  ?Transportation Needs: Not on file  ?Physical Activity: Not on file  ?Stress: Not on file  ?Social Connections: Not on file  ? ?Past Surgical History:  ?Procedure Laterality Date  ? ANKLE SURGERY    ? BREAST SURGERY  8/09  ? (Rt)Lumpectomy,sentinel node  ? EYE SURGERY    ? MASTECTOMY, PARTIAL Right 07/31/08  ? right node Bx (neg)  ? RETINAL DETACHMENT SURGERY  june 28th 2012  ? TONSILLECTOMY AND ADENOIDECTOMY    ? ?Past Surgical History:  ?Procedure Laterality Date  ? ANKLE SURGERY    ? BREAST SURGERY  8/09  ? (Rt)Lumpectomy,sentinel node  ? EYE SURGERY    ? MASTECTOMY, PARTIAL Right 07/31/08  ? right node Bx (neg)  ? RETINAL DETACHMENT SURGERY  june 28th 2012  ? TONSILLECTOMY AND ADENOIDECTOMY    ? ?Past Medical History:  ?Diagnosis  Date  ? Aneurysm of aorta ([place]) 8/09  ? 4.1cm ascending aortic aneurysm on breast MRI  ? Basal cell cancer   ? Cancer [facility]) 07/2008  ? breast cancer(rt)  ? Degenerative disc disease   ? neck  ? Elevated cholesterol   ? Fibroid   ? Hypertension   ? Infertility, female   ? Spinal arthritis   ? ?There were no vitals taken for this visit. ? ?Opioid Risk Score:   ?Fall Risk Score:  `1 ? ?Depression screen PHQ 2/9 ? ? ?  01/20/2022  ?  1:18 PM 11/27/2021  ? 12:47 PM 07/16/2020  ? 12:12 PM 05/02/2018  ? 11:11 AM 03/12/2016  ? 11:28 AM 02/13/2016  ? 12:30 PM 07/22/2015  ?  8:44 AM  ?Depression screen PHQ 2/9  ?Decreased Interest 0 0 0 0 0 0 0  ?Down, Depressed, Hopeless 0 0 0 0 0 0 0  ?PHQ - 2 Score 0 0 0 0 0 0 0  ?  ? ?Review of Systems   ?Constitutional: Negative.   ?HENT: Negative.    ?Eyes: Negative.   ?Respiratory: Negative.    ?Cardiovascular: Negative.   ?Gastrointestinal: Negative.   ?Endocrine: Negative.   ?Genitourinary: Negative.   ?Musculoskeletal:  Positive for back pain.  ?Skin: Negative.   ?Allergic/Immunologic: Negative.   ?Neurological: Negative.   ?Hematological: Negative.   ?Psychiatric/Behavioral: Negative.    ?All other systems reviewed and are negative. ? ?   ?Objective:  ? Physical Exam ?Vitals and nursing note reviewed.  ?Constitutional:   ?   Appearance: She is normal weight.  ?HENT:  ?   Head: Normocephalic and atraumatic.  ?Eyes:  ?   Extraocular Movements: Extraocular movements intact.  ?   Conjunctiva/sclera: Conjunctivae normal.  ?   Pupils: Pupils are equal, round, and reactive to light.  ?Neck:  ?   Comments: Patient has tenderness over the left suboccipital area.  No tenderness over the upper trap no tenderness along the cervical paraspinal area. ? ?Musculoskeletal:  ?   Cervical back: Normal range of motion. Tenderness present. No rigidity.  ?   Comments: Mild tenderness left gluteal region no pain with hip range of motion bilaterally.  Negative Faber's bilaterally.  ?Neurological:  ?   Mental Status: She is alert and oriented to person, place, and time.  ?   Sensory: Sensation is intact.  ?   Motor: Motor function is intact.  ?   Coordination: Coordination is intact.  ?   Gait: Gait is intact.  ?   Comments: Motor strength is 5/5 bilateral deltoid bicep tricep grip hip flexor knee extensor ankle dorsiflexor and plantar flexor ?Sensation normal in bilateral upper and lower limbs ?Tone is normal bilateral upper and lower limbs  ? ? ? ? ? ?   ?Assessment & Plan:  ? ?1.  Lumbar spondylosis without myelopathy improved after radiofrequency neurotomy to denervate L4-5 L5-S1 levels. ?2.  Sacroiliac pain relieved by sacroiliac injection on the right side and only partially relieved on the left side.  She may have more of a  muscular component to her pain on the left side consider gluteus medius syndrome. ?3.  Cervical pain appears to be mainly muscular suboccipital alternatively could be upper cervical facet arthropathy.  We will repeat x-rays check in 1 month if no better consider trigger point injections versus PT referral.  At this point the patient would like to just hold off on further treatment other than just x-rays ?

## 2022-04-10 ENCOUNTER — Telehealth: Payer: Self-pay

## 2022-04-10 MED ORDER — TRAMADOL HCL 50 MG PO TABS: 50.0000 mg | ORAL_TABLET | Freq: Two times a day (BID) | ORAL | 5 refills | Status: DC

## 2022-04-10 NOTE — Telephone Encounter (Signed)
Patient is calling stating when she was seen in the clinic on 03/24/22 a prescription for Tramadol was supposed to be sent in. The pharmacy does not have the prescription.  ?

## 2022-04-10 NOTE — Addendum Note (Signed)
Addended by: Charlett Blake on: 04/10/2022 04:52 PM ? ? Modules accepted: Orders ? ?

## 2022-04-20 DIAGNOSIS — Z79899 Other long term (current) drug therapy: Secondary | ICD-10-CM | POA: Diagnosis not present

## 2022-04-20 DIAGNOSIS — R7309 Other abnormal glucose: Secondary | ICD-10-CM | POA: Diagnosis not present

## 2022-04-22 DIAGNOSIS — K5120 Ulcerative (chronic) proctitis without complications: Secondary | ICD-10-CM | POA: Diagnosis not present

## 2022-04-22 DIAGNOSIS — K120 Recurrent oral aphthae: Secondary | ICD-10-CM | POA: Diagnosis not present

## 2022-04-22 DIAGNOSIS — E782 Mixed hyperlipidemia: Secondary | ICD-10-CM | POA: Diagnosis not present

## 2022-04-22 DIAGNOSIS — K219 Gastro-esophageal reflux disease without esophagitis: Secondary | ICD-10-CM | POA: Diagnosis not present

## 2022-04-22 DIAGNOSIS — E559 Vitamin D deficiency, unspecified: Secondary | ICD-10-CM | POA: Diagnosis not present

## 2022-04-22 DIAGNOSIS — M85852 Other specified disorders of bone density and structure, left thigh: Secondary | ICD-10-CM | POA: Diagnosis not present

## 2022-04-22 DIAGNOSIS — Z23 Encounter for immunization: Secondary | ICD-10-CM | POA: Diagnosis not present

## 2022-04-22 DIAGNOSIS — I10 Essential (primary) hypertension: Secondary | ICD-10-CM | POA: Diagnosis not present

## 2022-04-22 DIAGNOSIS — J301 Allergic rhinitis due to pollen: Secondary | ICD-10-CM | POA: Diagnosis not present

## 2022-04-22 DIAGNOSIS — H409 Unspecified glaucoma: Secondary | ICD-10-CM | POA: Diagnosis not present

## 2022-04-22 DIAGNOSIS — R7309 Other abnormal glucose: Secondary | ICD-10-CM | POA: Diagnosis not present

## 2022-04-22 DIAGNOSIS — M545 Low back pain, unspecified: Secondary | ICD-10-CM | POA: Diagnosis not present

## 2022-04-27 DIAGNOSIS — L820 Inflamed seborrheic keratosis: Secondary | ICD-10-CM | POA: Diagnosis not present

## 2022-05-07 DIAGNOSIS — K51311 Ulcerative (chronic) rectosigmoiditis with rectal bleeding: Secondary | ICD-10-CM | POA: Diagnosis not present

## 2022-05-29 DIAGNOSIS — H40052 Ocular hypertension, left eye: Secondary | ICD-10-CM | POA: Diagnosis not present

## 2022-05-29 DIAGNOSIS — H353132 Nonexudative age-related macular degeneration, bilateral, intermediate dry stage: Secondary | ICD-10-CM | POA: Diagnosis not present

## 2022-05-29 DIAGNOSIS — H35431 Paving stone degeneration of retina, right eye: Secondary | ICD-10-CM | POA: Diagnosis not present

## 2022-05-29 DIAGNOSIS — H35352 Cystoid macular degeneration, left eye: Secondary | ICD-10-CM | POA: Diagnosis not present

## 2022-05-29 DIAGNOSIS — H33002 Unspecified retinal detachment with retinal break, left eye: Secondary | ICD-10-CM | POA: Diagnosis not present

## 2022-05-29 DIAGNOSIS — Z961 Presence of intraocular lens: Secondary | ICD-10-CM | POA: Diagnosis not present

## 2022-06-02 DIAGNOSIS — K644 Residual hemorrhoidal skin tags: Secondary | ICD-10-CM | POA: Diagnosis not present

## 2022-06-25 DIAGNOSIS — K51311 Ulcerative (chronic) rectosigmoiditis with rectal bleeding: Secondary | ICD-10-CM | POA: Diagnosis not present

## 2022-06-25 DIAGNOSIS — D509 Iron deficiency anemia, unspecified: Secondary | ICD-10-CM | POA: Diagnosis not present

## 2022-07-10 DIAGNOSIS — H40052 Ocular hypertension, left eye: Secondary | ICD-10-CM | POA: Diagnosis not present

## 2022-07-29 ENCOUNTER — Ambulatory Visit: Payer: PPO | Admitting: Internal Medicine

## 2022-07-30 ENCOUNTER — Ambulatory Visit: Payer: PPO | Admitting: Internal Medicine

## 2022-08-02 NOTE — Progress Notes (Signed)
Sandra Tapia, female    DOB: July 24, 1946    MRN: [id number]     Brief patient profile:  67 yowf never smoker  MZ phenotype with  "bronchitis" as child and did track in HS with onset of hoarseness, runny nose in springtime around early 2000  some itching / sneezing evolving to year round some better on allegra / flonase then onset abruptly Nov 28 2017 hoarseness and coughing and wheezing rx omeprazole by Dr Sandra Tapia and seemed to improve on zantac in am and ppi otc in pm and then gradually worse since May 2019 attributed to zantac going off the market and since then cough worsened since then and changed to omeprazole 20 mg bid ac and better but cough persisted so referred to pulmonary clinic 12/26/2018 by Dr  Sandra Tapia.     History of Present Illness   12/26/2018  Pulmonary/ 1st office eval/Sandra Tapia  Chief Complaint  Patient presents with   Pulmonary Consult    Referred by Dr Sandra Tapia.  Pt c/o cough since Dec 2018. Cough is occ prod with yellow sputum.    Dyspnea:  Not limited by breathing from desired activities   Cough: worse in late am's, better with cough drops / min slt yellow mucus Sleep: able to lie flat, one pillow  SABA use: none  rec Stop lisinopril and start micardis 40-12.5 one daily (take a half if too strong, take two if too weak Omeprazole 20 mg Take 30- 60 min before your first and last meals of the day (until 100% better, then just once a day should do. GERD diet   01/25/2019  f/u ov/Sandra Tapia re: acei cough/ bronchiectasis/ hbp over treated on micardis 40-12.5 just taking a quarter Chief Complaint  Patient presents with   Follow-up    Cough is much improved. She states she has some congestion in her throat in the am.   Dyspnea:  Not limited by breathing from desired activities   Cough: sense of globus day not noct Sleeping: fine flat SABA use: none  02: none rec Bronchiectasis =   you have scarring of your bronchial tubes which means that they don't function perfectly  normally and mucus tends to pool in certain areas of your lung which can cause pneumonia and further scarring of your lung and bronchial tubes(think of a broken escalator) Whenever you develop cough congestion take mucinex or mucinex dm        07/26/2019  f/u ov/Sandra Tapia re:  Bronchiectasis with MZ phenotype, acei case Chief Complaint  Patient presents with   Follow-up    PFT's done today. She states her cough has been better. No new co's.   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  - the Z gene risks emphysema and bronchiectasis especially if you smoke  Dymista has astelin plus flonase would be a good option for you if not doing well with just the astelin pointer toward you ear on same side    07/29/2020  f/u ov/Sandra Tapia re: MZ/bronchiectasis  /rhinitis has not tried dymista yet Chief Complaint  Patient presents with   Follow-up    Denies any problems   Dyspnea:  Walking daily up to 40 min some hills no  sob  Cough: worse first thing in am x sev months, not much production/ occ  light green assoc with nasal drainage  Sleeping: flat  bed on side one pillow  SABA use: none  02: none  Rec Try prilosec (omeprazole) otc '20mg'$   Take 30-60 min before first meal of the day and Pepcid ac (famotidine) 20 mg one an hour or so before  bedtime until cough is completely gone for at least a week without the need for cough suppression GERD diet reviewed, bed blocks rec   Late add:  Change nasal spray to dymista 1-2 twice daily each nostril    07/29/2021  f/u ov/Sandra Tapia re: bronchiectasis /rhinitis Chief Complaint  Patient presents with   Follow-up    Breathing is overall doing well. She has occ cough with minimal yellow sputum first thing in the am.     Dyspnea:  still  walking up to 40 min daily  Cough: worse first thing in am x one hour clearing throat but min mucoid  sputum without obvious pnds/ nasal discharge now  Sleeping: bunch of pillows  SABA use: none  02: none  Covid status:   vax x 4   Rec GERD diet reviewed, bed blocks rec  Make sure you check your oxygen saturation at your highest level of activity to be sure it stays over 90%  Please schedule a follow up visit in 12 months but call sooner if needed    08/03/2022  f/u ov/Sandra Tapia re: bronchiectasis/ MZ maint on no rx   Chief Complaint  Patient presents with   Follow-up    Breathing is overall doing well and she is not coughing much. No new co's.   Dyspnea:  walk for 30 min most days some hills s sob  Cough: no am cough  Sleeping: bunch of pillows flat bed  SABA use: none  02: none      No obvious day to day or daytime variability or assoc excess/ purulent sputum or mucus plugs or hemoptysis or cp or chest tightness, subjective wheeze or overt sinus or hb symptoms.   Sleeping  without nocturnal  or early am exacerbation  of respiratory  c/o's or need for noct saba. Also denies any obvious fluctuation of symptoms with weather or environmental changes or other aggravating or alleviating factors except as outlined above   No unusual exposure hx or h/o childhood pna/ asthma or knowledge of premature birth.  Current Allergies, Complete Past Medical History, Past Surgical History, Family History, and Social History were reviewed in [employer] record.  ROS  The following are not active complaints unless bolded Hoarseness, sore throat, dysphagia, dental problems, itching, sneezing,  nasal congestion or discharge of excess mucus or purulent secretions, ear ache,   fever, chills, sweats, unintended wt loss or wt gain, classically pleuritic or exertional cp,  orthopnea pnd or arm/hand swelling  or leg swelling, presyncope, palpitations, abdominal pain, anorexia, nausea,  vomiting, diarrhea  or change in bowel habits or change in bladder habits, change in stools or change in urine, dysuria, hematuria,  rash, arthralgias, visual complaints, headache, numbness, weakness or ataxia or problems with walking or coordination,  change in mood or  memory.        Current Meds  Medication Sig   Calcium Carb-Cholecalciferol (CALCIUM 600 + D PO) Take 2 tablets by mouth daily.             Objective:     wts  08/03/2022  131  07/29/2021          128  07/29/2020          125   01/25/19 126 lb 9.6 oz (57.4 kg)  12/26/18 126 lb 12.8 oz (57.5 kg)  10/07/18 125 lb 6.4 oz (56.9 kg)     Vital signs reviewed  08/03/2022  - Note at rest 02 sats  97% on RA   General appearance:    pleasant amb wf nad       HEENT : Oropharynx  clear      Nasal turbinates nl    NECK :  without  apparent JVD/ palpable Nodes/TM    LUNGS: no acc muscle use,  kyphoscoliotic chest  with insp coarse crackles R > L base   without cough on insp or exp maneuvers   CV:  RRR  no s3   2/6 HSM s increase in P2, and no edema   ABD:  soft and nontender with nl inspiratory excursion in the supine position. No bruits or organomegaly appreciated   MS:  Nl gait/ ext warm without deformities Or obvious joint restrictions  calf tenderness, cyanosis or clubbing    SKIN: warm and dry without lesions    NEURO:  alert, approp, nl sensorium with  no motor or cerebellar deficits apparent.    CXR PA and Lateral:   08/03/2022 :    I personally reviewed images and impression is as follows:     Hyperinflated with non specific increased markings/ kyphoscoliosis s acute findings         Assessment

## 2022-08-03 ENCOUNTER — Encounter: Payer: Self-pay | Admitting: Internal Medicine

## 2022-08-03 ENCOUNTER — Ambulatory Visit (INDEPENDENT_AMBULATORY_CARE_PROVIDER_SITE_OTHER): Payer: PPO | Admitting: Internal Medicine

## 2022-08-03 ENCOUNTER — Ambulatory Visit (INDEPENDENT_AMBULATORY_CARE_PROVIDER_SITE_OTHER): Payer: PPO

## 2022-08-03 DIAGNOSIS — J479 Bronchiectasis, uncomplicated: Secondary | ICD-10-CM

## 2022-08-03 NOTE — Assessment & Plan Note (Signed)
Assoc with scoliosis/ S/p R breast RT 2009 with nl baseline cxr 01/27/18 and new plain cxr changes RLL post basal segment 08/26/18  - CT chest with contrast 01/24/2019  1. Spectrum of findings within the right lung including bronchiectasis airspace consolidation and tree-in-bud nodularity identified. Imaging findings are favored to represent sequelae of chronic, atypical infection such as MAI. Recurrent aspiration is also a differential consideration. 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  Total time for H and P, chart review, counseling,   and generating customized AVS unique to this office visit / same day charting = 25 min

## 2022-08-03 NOTE — Patient Instructions (Addendum)
No change in recommendations  Please remember to go to the  x-ray department  for your tests - we will call you with the results when they are available     Please schedule a follow up visit in 12  months but call sooner if needed

## 2022-08-27 DIAGNOSIS — L820 Inflamed seborrheic keratosis: Secondary | ICD-10-CM | POA: Diagnosis not present

## 2022-09-10 DIAGNOSIS — K6289 Other specified diseases of anus and rectum: Secondary | ICD-10-CM | POA: Diagnosis not present

## 2022-09-10 DIAGNOSIS — K626 Ulcer of anus and rectum: Secondary | ICD-10-CM | POA: Diagnosis not present

## 2022-09-10 DIAGNOSIS — D124 Benign neoplasm of descending colon: Secondary | ICD-10-CM | POA: Diagnosis not present

## 2022-09-10 DIAGNOSIS — K5289 Other specified noninfective gastroenteritis and colitis: Secondary | ICD-10-CM | POA: Diagnosis not present

## 2022-09-10 DIAGNOSIS — K633 Ulcer of intestine: Secondary | ICD-10-CM | POA: Diagnosis not present

## 2022-09-10 DIAGNOSIS — D128 Benign neoplasm of rectum: Secondary | ICD-10-CM | POA: Diagnosis not present

## 2022-09-10 DIAGNOSIS — K6389 Other specified diseases of intestine: Secondary | ICD-10-CM | POA: Diagnosis not present

## 2022-09-10 DIAGNOSIS — K5730 Diverticulosis of large intestine without perforation or abscess without bleeding: Secondary | ICD-10-CM | POA: Diagnosis not present

## 2022-09-10 DIAGNOSIS — K625 Hemorrhage of anus and rectum: Secondary | ICD-10-CM | POA: Diagnosis not present

## 2022-09-10 DIAGNOSIS — K648 Other hemorrhoids: Secondary | ICD-10-CM | POA: Diagnosis not present

## 2022-09-14 DIAGNOSIS — K6389 Other specified diseases of intestine: Secondary | ICD-10-CM | POA: Diagnosis not present

## 2022-09-14 DIAGNOSIS — K5289 Other specified noninfective gastroenteritis and colitis: Secondary | ICD-10-CM | POA: Diagnosis not present

## 2022-09-14 DIAGNOSIS — D124 Benign neoplasm of descending colon: Secondary | ICD-10-CM | POA: Diagnosis not present

## 2022-10-02 DIAGNOSIS — H40052 Ocular hypertension, left eye: Secondary | ICD-10-CM | POA: Diagnosis not present

## 2022-10-02 DIAGNOSIS — H353132 Nonexudative age-related macular degeneration, bilateral, intermediate dry stage: Secondary | ICD-10-CM | POA: Diagnosis not present

## 2022-10-02 DIAGNOSIS — H33002 Unspecified retinal detachment with retinal break, left eye: Secondary | ICD-10-CM | POA: Diagnosis not present

## 2022-10-02 DIAGNOSIS — H35352 Cystoid macular degeneration, left eye: Secondary | ICD-10-CM | POA: Diagnosis not present

## 2022-10-02 DIAGNOSIS — Z961 Presence of intraocular lens: Secondary | ICD-10-CM | POA: Diagnosis not present

## 2022-10-02 DIAGNOSIS — H35431 Paving stone degeneration of retina, right eye: Secondary | ICD-10-CM | POA: Diagnosis not present

## 2022-10-21 DIAGNOSIS — E782 Mixed hyperlipidemia: Secondary | ICD-10-CM | POA: Diagnosis not present

## 2022-10-21 DIAGNOSIS — Z79899 Other long term (current) drug therapy: Secondary | ICD-10-CM | POA: Diagnosis not present

## 2022-10-21 DIAGNOSIS — Z1389 Encounter for screening for other disorder: Secondary | ICD-10-CM | POA: Diagnosis not present

## 2022-10-21 DIAGNOSIS — Z0000 Encounter for general adult medical examination without abnormal findings: Secondary | ICD-10-CM | POA: Diagnosis not present

## 2022-10-21 DIAGNOSIS — R7309 Other abnormal glucose: Secondary | ICD-10-CM | POA: Diagnosis not present

## 2022-10-23 DIAGNOSIS — K5130 Ulcerative (chronic) rectosigmoiditis without complications: Secondary | ICD-10-CM | POA: Diagnosis not present

## 2022-10-23 DIAGNOSIS — Z79899 Other long term (current) drug therapy: Secondary | ICD-10-CM | POA: Diagnosis not present

## 2022-10-27 DIAGNOSIS — Z79899 Other long term (current) drug therapy: Secondary | ICD-10-CM | POA: Diagnosis not present

## 2022-10-27 DIAGNOSIS — K219 Gastro-esophageal reflux disease without esophagitis: Secondary | ICD-10-CM | POA: Diagnosis not present

## 2022-10-27 DIAGNOSIS — K5130 Ulcerative (chronic) rectosigmoiditis without complications: Secondary | ICD-10-CM | POA: Diagnosis not present

## 2022-10-27 DIAGNOSIS — M545 Low back pain, unspecified: Secondary | ICD-10-CM | POA: Diagnosis not present

## 2022-10-27 DIAGNOSIS — G629 Polyneuropathy, unspecified: Secondary | ICD-10-CM | POA: Diagnosis not present

## 2022-10-27 DIAGNOSIS — K120 Recurrent oral aphthae: Secondary | ICD-10-CM | POA: Diagnosis not present

## 2022-10-27 DIAGNOSIS — I872 Venous insufficiency (chronic) (peripheral): Secondary | ICD-10-CM | POA: Diagnosis not present

## 2022-10-27 DIAGNOSIS — M85852 Other specified disorders of bone density and structure, left thigh: Secondary | ICD-10-CM | POA: Diagnosis not present

## 2022-10-27 DIAGNOSIS — H409 Unspecified glaucoma: Secondary | ICD-10-CM | POA: Diagnosis not present

## 2022-10-27 DIAGNOSIS — E782 Mixed hyperlipidemia: Secondary | ICD-10-CM | POA: Diagnosis not present

## 2022-10-27 DIAGNOSIS — I10 Essential (primary) hypertension: Secondary | ICD-10-CM | POA: Diagnosis not present

## 2022-10-27 DIAGNOSIS — R7303 Prediabetes: Secondary | ICD-10-CM | POA: Diagnosis not present

## 2022-11-08 DIAGNOSIS — J029 Acute pharyngitis, unspecified: Secondary | ICD-10-CM | POA: Diagnosis not present

## 2022-11-08 DIAGNOSIS — R059 Cough, unspecified: Secondary | ICD-10-CM | POA: Diagnosis not present

## 2022-11-08 DIAGNOSIS — R0981 Nasal congestion: Secondary | ICD-10-CM | POA: Diagnosis not present

## 2022-12-25 DIAGNOSIS — H26491 Other secondary cataract, right eye: Secondary | ICD-10-CM | POA: Diagnosis not present

## 2022-12-25 DIAGNOSIS — H353132 Nonexudative age-related macular degeneration, bilateral, intermediate dry stage: Secondary | ICD-10-CM | POA: Diagnosis not present

## 2022-12-25 DIAGNOSIS — H35431 Paving stone degeneration of retina, right eye: Secondary | ICD-10-CM | POA: Diagnosis not present

## 2022-12-25 DIAGNOSIS — H43391 Other vitreous opacities, right eye: Secondary | ICD-10-CM | POA: Diagnosis not present

## 2022-12-25 DIAGNOSIS — H35352 Cystoid macular degeneration, left eye: Secondary | ICD-10-CM | POA: Diagnosis not present

## 2022-12-25 DIAGNOSIS — H33002 Unspecified retinal detachment with retinal break, left eye: Secondary | ICD-10-CM | POA: Diagnosis not present

## 2022-12-25 DIAGNOSIS — Z961 Presence of intraocular lens: Secondary | ICD-10-CM | POA: Diagnosis not present

## 2022-12-25 DIAGNOSIS — H40052 Ocular hypertension, left eye: Secondary | ICD-10-CM | POA: Diagnosis not present

## 2023-01-04 DIAGNOSIS — M810 Age-related osteoporosis without current pathological fracture: Secondary | ICD-10-CM | POA: Diagnosis not present

## 2023-01-04 DIAGNOSIS — M85852 Other specified disorders of bone density and structure, left thigh: Secondary | ICD-10-CM | POA: Diagnosis not present

## 2023-01-04 DIAGNOSIS — Z1231 Encounter for screening mammogram for malignant neoplasm of breast: Secondary | ICD-10-CM | POA: Diagnosis not present

## 2023-01-04 DIAGNOSIS — Z780 Asymptomatic menopausal state: Secondary | ICD-10-CM | POA: Diagnosis not present

## 2023-01-04 DIAGNOSIS — M85851 Other specified disorders of bone density and structure, right thigh: Secondary | ICD-10-CM | POA: Diagnosis not present

## 2023-01-18 DIAGNOSIS — L820 Inflamed seborrheic keratosis: Secondary | ICD-10-CM | POA: Diagnosis not present

## 2023-01-25 ENCOUNTER — Other Ambulatory Visit: Payer: Self-pay | Admitting: Physical Medicine & Rehabilitation

## 2023-01-27 ENCOUNTER — Telehealth: Payer: Self-pay | Admitting: Physical Medicine & Rehabilitation

## 2023-01-27 NOTE — Telephone Encounter (Signed)
Requesting refill for tramadol

## 2023-01-28 NOTE — Telephone Encounter (Signed)
The patient called back this morning and said the pharmacy told her the date has expired on the refill she had.

## 2023-01-29 DIAGNOSIS — K5130 Ulcerative (chronic) rectosigmoiditis without complications: Secondary | ICD-10-CM | POA: Diagnosis not present

## 2023-01-29 MED ORDER — TRAMADOL HCL 50 MG PO TABS: 50.0000 mg | ORAL_TABLET | Freq: Two times a day (BID) | ORAL | 5 refills | Status: DC

## 2023-02-12 ENCOUNTER — Encounter: Payer: Self-pay | Admitting: Cardiology

## 2023-02-12 ENCOUNTER — Ambulatory Visit: Payer: PPO | Attending: Cardiology | Admitting: Cardiology

## 2023-02-12 VITALS — BP 110/62 | HR 47 | Ht 63.0 in | Wt 128.6 lb

## 2023-02-12 DIAGNOSIS — I77810 Thoracic aortic ectasia: Secondary | ICD-10-CM | POA: Diagnosis not present

## 2023-02-12 DIAGNOSIS — I10 Essential (primary) hypertension: Secondary | ICD-10-CM

## 2023-02-12 NOTE — Patient Instructions (Signed)
Medication Instructions:   Your physician recommends that you continue on your current medications as directed. Please refer to the Current Medication list given to you today.   *If you need a refill on your cardiac medications before your next appointment, please call your pharmacy*   Lab Work:  None ordered.  If you have labs (blood work) drawn today and your tests are completely normal, you will receive your results only by: [place] (if you have MyChart) OR A paper copy in the mail If you have any lab test that is abnormal or we need to change your treatment, we will call you to review the results.   Testing/Procedures:  Your physician has requested that you have an echocardiogram. Echocardiography is a painless test that uses sound waves to create images of your heart. It provides your doctor with information about the size and shape of your heart and how well your heart's chambers and valves are working. This procedure takes approximately one hour. There are no restrictions for this procedure. Please do NOT wear perfume or lotions (deodorant is allowed). Please arrive 15 minutes prior to your appointment time.    Follow-Up: At [facility], you and your health needs are our priority.  As part of our continuing mission to provide you with exceptional heart care, we have created designated Provider Care Teams.  These Care Teams include your primary Cardiologist (physician) and Advanced Practice Providers (APPs -  Physician Assistants and Nurse Practitioners) who all work together to provide you with the care you need, when you need it.  We recommend signing up for the patient portal called "MyChart".  Sign up information is provided on this After Visit Summary.  MyChart is used to connect with patients for Virtual Visits (Telemedicine).  Patients are able to view lab/test results, encounter notes, upcoming appointments, etc.  Non-urgent messages can be sent to your  provider as well.   To learn more about what you can do with MyChart, go to [email or web address].    Your next appointment:   1 year(s)  Provider:   Candee Furbish, MD     Other Instructions  Your physician wants you to follow-up in: 1 year with Dr. Marlou Porch.  You will receive a reminder letter in the mail two months in advance. If you don't receive a letter, please call our office to schedule the follow-up appointment.

## 2023-02-12 NOTE — Progress Notes (Signed)
[place]. [address], [address] [place], [place]  [address] Phone: [phone] Fax:  [phone]  Date:  02/12/2023   ID:  Sandra Tapia, Sandra Tapia [birth year], MRN [id number]  PCP:  Cari Caraway, MD   History of Present Illness: Sandra Tapia is a 77 y.o. female here for follow up for dilated aortic root.  with aortic root of 4.1 -> 4.3 -> 4.35-->4.2-->4,4 cm ascending aorta. She also has mild to moderate valvular disease. Prior MRI of chest demonstrated no evidence of coarctation. Normal ejection fraction.   Prior LDL cholesterol, she is on atorvastatin, under good control LDL 75. She is also breast cancer survivor with XRT/lumpectomy. No chemotherapy other than tamoxifen.  Prior exercise treadmill test reassuring. This was performed because of increasing dyspnea on exertion. She is no longer complaining of this symptom.  She will occasionally feel fluttering sensation, fleeting, periodic.  08/22/2018- sometimes feels some dizziness at times, some fatigue.  No chest pain, no syncope, no orthopnea, no PND.  09/28/2019- here for follow-up of dilated aortic root.  I have seen Dr. Gustavus Bryant note, bronchiectasis.  Overall doing well with as needed Mucinex.  No chest pain fevers chills nausea vomiting syncope.  Aortic root 4.1 cm on CT scan.  Father had AAA repair.  Overall she says that she feels fine.  Occasional numbness, tingling, and coldness in her left lower extremity (prior ankle injury).  No fevers chills nausea vomiting syncope bleeding.  Wt Readings from Last 3 Encounters:  02/12/23 128 lb 9.6 oz (58.3 kg)  08/03/22 131 lb (59.4 kg)  03/24/22 127 lb 3.2 oz (57.7 kg)     Past Medical History:  Diagnosis Date   Aneurysm of aorta ([facility]) 8/09   4.1cm ascending aortic aneurysm on breast MRI   Basal cell cancer    Cancer ([facility]) 07/2008   breast cancer(rt)   Degenerative disc disease    neck   Elevated cholesterol    Fibroid    Hypertension    Infertility, female     Spinal arthritis     Past Surgical History:  Procedure Laterality Date   ANKLE SURGERY     BREAST SURGERY  8/09   (Rt)Lumpectomy,sentinel node   EYE SURGERY     MASTECTOMY, PARTIAL Right 07/31/08   right node Bx (neg)   RETINAL DETACHMENT SURGERY  june 28th 2012   TONSILLECTOMY AND ADENOIDECTOMY      Current Outpatient Medications  Medication Sig Dispense Refill   ALPHAGAN P 0.1 % SOLN Place 1 drop into the left eye 2 (two) times daily.      amLODipine (NORVASC) 5 MG tablet Take 5 mg by mouth daily.     atorvastatin (LIPITOR) 40 MG tablet Take 40 mg by mouth daily.     Biotin 10 MG CAPS Take 1 tablet by mouth daily. 1 tablet     Calcium Carb-Cholecalciferol (CALCIUM 600 + D PO) Take 2 tablets by mouth daily.     cetirizine (ZYRTEC) 10 MG tablet Take 10 mg by mouth daily.     diclofenac (VOLTAREN) 75 MG EC tablet Take 1 tablet (75 mg total) by mouth 2 (two) times daily. 60 tablet 2   hydrochlorothiazide (HYDRODIURIL) 12.5 MG tablet Take 12.5 mg by mouth daily.     ketorolac (ACULAR) 0.5 % ophthalmic solution Apply to eye.     mesalamine (CANASA) 1000 MG suppository Place 1,000 mg rectally as directed.     Multiple Vitamin (MULTIVITAMIN) tablet Take 1  tablet by mouth daily.     omeprazole (PRILOSEC) 40 MG capsule Take 40 mg by mouth every morning.     pilocarpine (PILOCAR) 2 % ophthalmic solution Apply to eye.     traMADol (ULTRAM) 50 MG tablet Take 1 tablet (50 mg total) by mouth 2 (two) times daily. 60 tablet 5   No current facility-administered medications for this visit.    Allergies:    Allergies  Allergen Reactions   Lisinopril     Other reaction(s): cough   Penicillins     Nausea & stomach pains Other reaction(s): severe stomach pain   Telmisartan     Other reaction(s): angioedema   Adhesive [Tape] Rash    Gel grounding pads    Social History:  The patient  reports that she has never smoked. She has never used smokeless tobacco. She reports that she does not  currently use alcohol after a past usage of about 7.0 standard drinks of alcohol per week. She reports that she does not use drugs.   Family History  Problem Relation Age of Onset   Hypertension Father    Macular degeneration Father     ROS:  Please see the history of present illness. All other systems are reviewed and negative.     PHYSICAL EXAM: VS:  BP 110/62   Pulse (!) 47   Ht 5' 3"$  (1.6 m)   Wt 128 lb 9.6 oz (58.3 kg)   SpO2 96%   BMI 22.78 kg/m  GEN: Well nourished, well developed, in no acute distress HEENT: normal Neck: no JVD, carotid bruits, or masses Cardiac: RRR; no murmurs, rubs, or gallops,no edema  Respiratory:  clear to auscultation bilaterally, normal work of breathing GI: soft, nontender, nondistended, + BS MS: no deformity or atrophy Skin: warm and dry, no rash Neuro:  Alert and Oriented x 3, Strength and sensation are intact Psych: euthymic mood, full affect    EKG: EKG is personally reviewed and interpreted.    02/12/2023: Sinus bradycardia 47 sinus arrhythmia noted nonspecific T wave changes. 10/30/2021: Sinus bradycardia, Rate 53 bpm. 09/28/2019-sinus bradycardia 54 with no other abnormalities personally reviewed  08/22/2018-sinus bradycardia 58 with no other abnormalities.   08/18/16-sinus rhythm 63 with nonspecific ST-T wave changes, no change from prior personally viewed-,  08/19/15-sinus rhythm, 64, nonspecific ST-T wave changes- 08/17/14-sinus rhythm, no other abnormalities    ECHO 10/30/21:   1. Left ventricular ejection fraction, by estimation, is 60 to 65%. The  left ventricle has normal function. The left ventricle has no regional  wall motion abnormalities. There is mild left ventricular hypertrophy of  the basal-septal segment. Left  ventricular diastolic parameters are consistent with Grade I diastolic  dysfunction (impaired relaxation).   2. Right ventricular systolic function is normal. The right ventricular  size is normal. There is  normal pulmonary artery systolic pressure.   3. The mitral valve is normal in structure. Mild mitral valve  regurgitation. No evidence of mitral stenosis.   4. The aortic valve is tricuspid. Aortic valve regurgitation is mild to  moderate. No aortic stenosis is present. Aortic regurgitation PHT measures  770 msec.   5. Aortic dilatation noted. There is moderate dilatation of the ascending  aorta, measuring 44 mm.   6. The inferior vena cava is normal in size with greater than 50%  respiratory variability, suggesting right atrial pressure of 3 mmHg.   ECHO: 08/18/16 . - Aorta: Ascending aortic diameter: 75m (S). - Ascending aorta: The ascending aorta was mildly  dilated.  Echocardiogram 08/22/2018  -Ascending aortic root 44 mm  -Mild to moderate aortic regurgitation.  -Normal EF  CT chest 01/24/19: 1. Spectrum of findings within the right lung including bronchiectasis airspace consolidation and tree-in-bud nodularity identified. Imaging findings are favored to represent sequelae of chronic, atypical infection such as MAI. Recurrent aspiration is also a differential consideration. Recommend follow-up imaging after appropriate therapy with repeat CT of the chest to assess for any temporal change in the appearance of the lungs. 2. Aortic atherosclerosis and mild aneurysmal dilatation of the ascending thoracic aorta 4.1cm.   ASSESSMENT AND PLAN:   Dilated aortic root Echocardiogram preliminary look stable from before.  Range has been between 42 and 44 mm.  Discussed with them in clinic.  We are checking an echocardiogram again  Avoid abrupt heavy lifting.  Continue with good blood pressure control. We talked about threshold for surgical referral.  Bronchiectasis without complication ([facility]) Right basilar crackles heard on exam.  Bronchiectasis.  Certainly this is also contributing to some of the shortness of breath that she is noticing during walks or her husband is noticing.  This  seems to be better.  It is not severe.  Continue to monitor this closely however given her valvular abnormalities.  Essential hypertension Excellent control.  No changes made.  Continue with HCTZ and amlodipine.  Allergic to telmisartan, lisinopril   F/U in one year.   Signed, Candee Furbish, MD [facility]  02/12/2023 10:22 AM

## 2023-03-09 DIAGNOSIS — L820 Inflamed seborrheic keratosis: Secondary | ICD-10-CM | POA: Diagnosis not present

## 2023-03-16 ENCOUNTER — Ambulatory Visit (HOSPITAL_COMMUNITY): Payer: PPO | Attending: Internal Medicine

## 2023-03-16 DIAGNOSIS — I10 Essential (primary) hypertension: Secondary | ICD-10-CM

## 2023-03-16 DIAGNOSIS — I77810 Thoracic aortic ectasia: Secondary | ICD-10-CM | POA: Insufficient documentation

## 2023-03-16 LAB — ECHOCARDIOGRAM COMPLETE
Area-P 1/2: 2.91 cm2
P 1/2 time: 644 msec
S' Lateral: 2.9 cm

## 2023-03-18 ENCOUNTER — Other Ambulatory Visit: Payer: Self-pay | Admitting: *Deleted

## 2023-03-18 DIAGNOSIS — I77810 Thoracic aortic ectasia: Secondary | ICD-10-CM

## 2023-03-29 DIAGNOSIS — H1031 Unspecified acute conjunctivitis, right eye: Secondary | ICD-10-CM | POA: Diagnosis not present

## 2023-04-09 DIAGNOSIS — H43391 Other vitreous opacities, right eye: Secondary | ICD-10-CM | POA: Diagnosis not present

## 2023-04-09 DIAGNOSIS — Z961 Presence of intraocular lens: Secondary | ICD-10-CM | POA: Diagnosis not present

## 2023-04-09 DIAGNOSIS — H353131 Nonexudative age-related macular degeneration, bilateral, early dry stage: Secondary | ICD-10-CM | POA: Diagnosis not present

## 2023-04-09 DIAGNOSIS — H26491 Other secondary cataract, right eye: Secondary | ICD-10-CM | POA: Diagnosis not present

## 2023-04-09 DIAGNOSIS — H353132 Nonexudative age-related macular degeneration, bilateral, intermediate dry stage: Secondary | ICD-10-CM | POA: Diagnosis not present

## 2023-04-09 DIAGNOSIS — H35352 Cystoid macular degeneration, left eye: Secondary | ICD-10-CM | POA: Diagnosis not present

## 2023-04-09 DIAGNOSIS — H40052 Ocular hypertension, left eye: Secondary | ICD-10-CM | POA: Diagnosis not present

## 2023-04-09 DIAGNOSIS — H35431 Paving stone degeneration of retina, right eye: Secondary | ICD-10-CM | POA: Diagnosis not present

## 2023-04-09 DIAGNOSIS — H33002 Unspecified retinal detachment with retinal break, left eye: Secondary | ICD-10-CM | POA: Diagnosis not present

## 2023-05-06 DIAGNOSIS — Z79899 Other long term (current) drug therapy: Secondary | ICD-10-CM | POA: Diagnosis not present

## 2023-05-06 DIAGNOSIS — R7309 Other abnormal glucose: Secondary | ICD-10-CM | POA: Diagnosis not present

## 2023-05-06 DIAGNOSIS — R7303 Prediabetes: Secondary | ICD-10-CM | POA: Diagnosis not present

## 2023-05-06 DIAGNOSIS — M85852 Other specified disorders of bone density and structure, left thigh: Secondary | ICD-10-CM | POA: Diagnosis not present

## 2023-05-10 DIAGNOSIS — I712 Thoracic aortic aneurysm, without rupture, unspecified: Secondary | ICD-10-CM | POA: Diagnosis not present

## 2023-05-10 DIAGNOSIS — M85852 Other specified disorders of bone density and structure, left thigh: Secondary | ICD-10-CM | POA: Diagnosis not present

## 2023-05-10 DIAGNOSIS — R7303 Prediabetes: Secondary | ICD-10-CM | POA: Diagnosis not present

## 2023-05-10 DIAGNOSIS — R5383 Other fatigue: Secondary | ICD-10-CM | POA: Diagnosis not present

## 2023-05-10 DIAGNOSIS — R001 Bradycardia, unspecified: Secondary | ICD-10-CM | POA: Diagnosis not present

## 2023-05-10 DIAGNOSIS — E782 Mixed hyperlipidemia: Secondary | ICD-10-CM | POA: Diagnosis not present

## 2023-05-10 DIAGNOSIS — J479 Bronchiectasis, uncomplicated: Secondary | ICD-10-CM | POA: Diagnosis not present

## 2023-05-10 DIAGNOSIS — H409 Unspecified glaucoma: Secondary | ICD-10-CM | POA: Diagnosis not present

## 2023-05-10 DIAGNOSIS — I10 Essential (primary) hypertension: Secondary | ICD-10-CM | POA: Diagnosis not present

## 2023-05-10 DIAGNOSIS — K5120 Ulcerative (chronic) proctitis without complications: Secondary | ICD-10-CM | POA: Diagnosis not present

## 2023-05-10 DIAGNOSIS — G629 Polyneuropathy, unspecified: Secondary | ICD-10-CM | POA: Diagnosis not present

## 2023-05-10 DIAGNOSIS — G5702 Lesion of sciatic nerve, left lower limb: Secondary | ICD-10-CM | POA: Diagnosis not present

## 2023-06-07 DIAGNOSIS — B351 Tinea unguium: Secondary | ICD-10-CM | POA: Diagnosis not present

## 2023-06-07 DIAGNOSIS — Z79899 Other long term (current) drug therapy: Secondary | ICD-10-CM | POA: Diagnosis not present

## 2023-06-11 DIAGNOSIS — H353131 Nonexudative age-related macular degeneration, bilateral, early dry stage: Secondary | ICD-10-CM | POA: Diagnosis not present

## 2023-06-11 DIAGNOSIS — H35431 Paving stone degeneration of retina, right eye: Secondary | ICD-10-CM | POA: Diagnosis not present

## 2023-06-11 DIAGNOSIS — H353132 Nonexudative age-related macular degeneration, bilateral, intermediate dry stage: Secondary | ICD-10-CM | POA: Diagnosis not present

## 2023-06-11 DIAGNOSIS — H26491 Other secondary cataract, right eye: Secondary | ICD-10-CM | POA: Diagnosis not present

## 2023-06-11 DIAGNOSIS — H33002 Unspecified retinal detachment with retinal break, left eye: Secondary | ICD-10-CM | POA: Diagnosis not present

## 2023-06-11 DIAGNOSIS — H35352 Cystoid macular degeneration, left eye: Secondary | ICD-10-CM | POA: Diagnosis not present

## 2023-06-11 DIAGNOSIS — H43391 Other vitreous opacities, right eye: Secondary | ICD-10-CM | POA: Diagnosis not present

## 2023-07-16 DIAGNOSIS — H40052 Ocular hypertension, left eye: Secondary | ICD-10-CM | POA: Diagnosis not present

## 2023-08-05 ENCOUNTER — Ambulatory Visit: Payer: PPO | Admitting: Internal Medicine

## 2023-08-09 NOTE — Progress Notes (Unsigned)
Sandra Tapia, female    DOB: 1946/08/08    MRN: [id number]     Brief patient profile:  45 yowf never smoker  MZ phenotype with  "bronchitis" as child (exposed to father's smoking) and did track in HS with onset of hoarseness, runny nose in springtime around early 2000  some itching / sneezing evolving to year round some better on allegra / flonase then onset abruptly Nov 28 2017 hoarseness and coughing and wheezing rx omeprazole by Dr Uvaldo Rising and seemed to improve on zantac in am and ppi otc in pm and then gradually worse since May 2019 attributed to zantac going off the market and since then cough worsened since then and changed to omeprazole 20 mg bid ac and better but cough persisted so referred to pulmonary clinic 12/26/2018 by Dr  Uvaldo Rising.     History of Present Illness   12/26/2018  Pulmonary/ 1st office eval/Sandra Tapia  Chief Complaint  Patient presents with   Pulmonary Consult    Referred by Dr Selena Batten.  Pt c/o cough since Dec 2018. Cough is occ prod with yellow sputum.    Dyspnea:  Not limited by breathing from desired activities   Cough: worse in late am's, better with cough drops / min slt yellow mucus Sleep: able to lie flat, one pillow  SABA use: none  rec Stop lisinopril and start micardis 40-12.5 one daily (take a half if too strong, take two if too weak Omeprazole 20 mg Take 30- 60 min before your first and last meals of the day (until 100% better, then just once a day should do. GERD diet   07/26/2019  f/u ov/Sandra Tapia re:  Bronchiectasis with MZ phenotype, acei case Chief Complaint  Patient presents with   Follow-up    PFT's done today. She states her cough has been better. No new co's.   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 No new co's.   Dyspnea:  walk for 30 min most days some hills s sob  Cough: no am cough  Sleeping: bunch of pillows flat bed  SABA use: none  02: none  Rec No change in recommendations    08/10/2023  yearly f/u ov/Sandra Tapia re: bronchiectasis, MZ  maint on no resp rx   Chief Complaint  Patient presents with   Follow-up    1 YEAR follow up   Dyspnea:  walking most days x 30 min / yardwork  Cough: none  Sleeping: flat bed/ bunch of pillows s resp cc  SABA use: none  02: none    No obvious day to day or daytime variability or assoc excess/ purulent sputum or mucus plugs or hemoptysis or cp or chest tightness, subjective wheeze or overt sinus or  hb symptoms.     Also denies any obvious fluctuation of symptoms with weather or environmental changes or other aggravating or alleviating factors except as outlined above   No unusual exposure hx or h/o childhood pna/ asthma or knowledge of premature birth.  Current Allergies, Complete Past Medical History, Past Surgical History, Family History, and Social History were reviewed in [employer] record.  ROS  The following are not active complaints unless bolded Hoarseness, sore throat, dysphagia, dental problems, itching, sneezing,  nasal congestion or discharge of excess mucus or purulent secretions, ear ache,   fever, chills, sweats, unintended wt loss or wt gain, classically pleuritic or exertional cp,  orthopnea pnd or arm/hand swelling  or leg swelling, presyncope, palpitations, abdominal pain, anorexia, nausea, vomiting, diarrhea   or change in bowel habits or change in bladder habits, change in stools or change in urine, dysuria, hematuria,  rash, arthralgias, visual complaints, headache, numbness, weakness or ataxia or problems with walking or coordination,  change in mood or  memory.        Current Meds  Medication Sig   ALPHAGAN P 0.1 % SOLN Place 1 drop into the left eye 2 (two) times daily.    amLODipine (NORVASC) 5 MG tablet Take 5 mg by mouth daily.   atorvastatin (LIPITOR) 40 MG tablet Take 40 mg by mouth daily.   Biotin 10 MG CAPS Take 1 tablet by mouth daily. 1 tablet   Calcium Carb-Cholecalciferol (CALCIUM 600 + D PO) Take 2 tablets by mouth daily.   cetirizine (ZYRTEC) 10 MG tablet Take 10 mg by mouth daily.   diclofenac (VOLTAREN) 75 MG EC tablet Take 1 tablet (75 mg total) by mouth 2 (two) times daily.   hydrochlorothiazide (HYDRODIURIL) 12.5 MG tablet Take 12.5 mg by mouth daily.   ketorolac (ACULAR) 0.5 % ophthalmic solution Apply to eye.   mesalamine (CANASA) 1000 MG suppository Place 1,000 mg rectally as directed.   Multiple Vitamin (MULTIVITAMIN) tablet Take 1 tablet by mouth daily.   omeprazole (PRILOSEC) 40 MG capsule Take 40 mg by mouth every morning.   pilocarpine (PILOCAR) 2 % ophthalmic solution Apply to eye.   traMADol (ULTRAM) 50 MG tablet Take 1 tablet (50 mg total) by mouth 2 (two) times daily.                Objective:     wts  08/10/2023        126   08/03/2022          131  07/29/2021          128  07/29/2020          125   01/25/19 126 lb 9.6 oz (57.4 kg)  12/26/18 126 lb 12.8 oz (57.5 kg)  10/07/18 125 lb 6.4 oz (56.9 kg)    Vital signs reviewed  08/10/2023  - Note at rest 02 sats  98% on RA   General appearance:    healthy appearing amb wf nad    HEENT : Oropharynx  clear         NECK :  without  apparent JVD/ palpable Nodes/TM    LUNGS: no acc muscle use,  Kyphoscoliotic  contour chest with insp coarse crackles both bases    CV:  RRR  no s3  2/6 HSM   s increase in  P2, and no edema   ABD:  soft and nontender with nl inspiratory excursion in the supine position. No bruits or organomegaly appreciated  MS:  Nl gait/ ext warm without deformities Or obvious joint restrictions  calf tenderness, cyanosis or clubbing    SKIN: warm and dry without lesions    NEURO:  alert, approp, nl sensorium with  no motor or cerebellar deficits apparent.       CXR PA and Lateral:   08/10/2023 :    I personally reviewed images and impression is as follows:     Kyphoscoliotic chest contour with non-specific lung markings no new or worrisome changes vs study one year ago.      Assessment

## 2023-08-10 ENCOUNTER — Ambulatory Visit (INDEPENDENT_AMBULATORY_CARE_PROVIDER_SITE_OTHER): Payer: PPO | Admitting: Internal Medicine

## 2023-08-10 ENCOUNTER — Encounter: Payer: Self-pay | Admitting: Internal Medicine

## 2023-08-10 ENCOUNTER — Ambulatory Visit: Payer: PPO

## 2023-08-10 VITALS — BP 114/68 | HR 55 | Resp 14 | Ht 63.0 in | Wt 126.0 lb

## 2023-08-10 DIAGNOSIS — J42 Unspecified chronic bronchitis: Secondary | ICD-10-CM | POA: Diagnosis not present

## 2023-08-10 DIAGNOSIS — J479 Bronchiectasis, uncomplicated: Secondary | ICD-10-CM | POA: Diagnosis not present

## 2023-08-10 DIAGNOSIS — K449 Diaphragmatic hernia without obstruction or gangrene: Secondary | ICD-10-CM | POA: Diagnosis not present

## 2023-08-10 NOTE — Patient Instructions (Signed)
Please remember to go to the x-ray department   for your tests - we will call you with the results when they are available.     Please schedule a follow up visit in  12 months but call sooner if needed.    

## 2023-08-10 NOTE — Assessment & Plan Note (Signed)
Assoc with scoliosis/ S/p R breast RT 2009 with nl baseline cxr 01/27/18 and new plain cxr changes RLL post basal segment 08/26/18  - CT chest with contrast 01/24/2019  1. Spectrum of findings within the right lung including bronchiectasis airspace consolidation and tree-in-bud nodularity identified. Imaging findings are favored to represent sequelae of chronic, atypical infection such as MAI. Recurrent aspiration is also a differential consideration. - Quant Ig's  01/25/2019  Ok - Alpha one screen 01/25/2019  MZ Level 97  - PFT's  07/26/2019  FEV1 1.95 (95 % ) ratio 0.76  p 7 % improvement from saba p no prior to study with DLCO  120.26  (110%) corrects to 4.62 (111%)  for alv volume and FV curve nl   Well compensated at present with no evidence of progression clinically or radiographically despite MZ phenotype   F/u can be yearly          Each maintenance medication was reviewed in detail including emphasizing most importantly the difference between maintenance and prns and under what circumstances the prns are to be triggered using an action plan format where appropriate.  Total time for H and P, chart review, counseling, and generating customized AVS unique to this office visit / same day charting = 26 min

## 2023-10-01 DIAGNOSIS — H40052 Ocular hypertension, left eye: Secondary | ICD-10-CM | POA: Diagnosis not present

## 2023-10-01 DIAGNOSIS — H353131 Nonexudative age-related macular degeneration, bilateral, early dry stage: Secondary | ICD-10-CM | POA: Diagnosis not present

## 2023-10-01 DIAGNOSIS — H43391 Other vitreous opacities, right eye: Secondary | ICD-10-CM | POA: Diagnosis not present

## 2023-10-01 DIAGNOSIS — Z8669 Personal history of other diseases of the nervous system and sense organs: Secondary | ICD-10-CM | POA: Diagnosis not present

## 2023-10-01 DIAGNOSIS — H35352 Cystoid macular degeneration, left eye: Secondary | ICD-10-CM | POA: Diagnosis not present

## 2023-10-01 DIAGNOSIS — H35431 Paving stone degeneration of retina, right eye: Secondary | ICD-10-CM | POA: Diagnosis not present

## 2023-10-01 DIAGNOSIS — H26491 Other secondary cataract, right eye: Secondary | ICD-10-CM | POA: Diagnosis not present

## 2023-10-01 DIAGNOSIS — Z961 Presence of intraocular lens: Secondary | ICD-10-CM | POA: Diagnosis not present

## 2023-10-01 DIAGNOSIS — H353132 Nonexudative age-related macular degeneration, bilateral, intermediate dry stage: Secondary | ICD-10-CM | POA: Diagnosis not present

## 2023-10-25 DIAGNOSIS — R7303 Prediabetes: Secondary | ICD-10-CM | POA: Diagnosis not present

## 2023-10-25 DIAGNOSIS — G629 Polyneuropathy, unspecified: Secondary | ICD-10-CM | POA: Diagnosis not present

## 2023-10-25 DIAGNOSIS — I10 Essential (primary) hypertension: Secondary | ICD-10-CM | POA: Diagnosis not present

## 2023-10-25 DIAGNOSIS — Z1331 Encounter for screening for depression: Secondary | ICD-10-CM | POA: Diagnosis not present

## 2023-10-25 DIAGNOSIS — Z6822 Body mass index (BMI) 22.0-22.9, adult: Secondary | ICD-10-CM | POA: Diagnosis not present

## 2023-10-25 DIAGNOSIS — E782 Mixed hyperlipidemia: Secondary | ICD-10-CM | POA: Diagnosis not present

## 2023-10-25 DIAGNOSIS — Z0000 Encounter for general adult medical examination without abnormal findings: Secondary | ICD-10-CM | POA: Diagnosis not present

## 2023-11-08 DIAGNOSIS — K219 Gastro-esophageal reflux disease without esophagitis: Secondary | ICD-10-CM | POA: Diagnosis not present

## 2023-11-08 DIAGNOSIS — M85852 Other specified disorders of bone density and structure, left thigh: Secondary | ICD-10-CM | POA: Diagnosis not present

## 2023-11-08 DIAGNOSIS — E782 Mixed hyperlipidemia: Secondary | ICD-10-CM | POA: Diagnosis not present

## 2023-11-08 DIAGNOSIS — I712 Thoracic aortic aneurysm, without rupture, unspecified: Secondary | ICD-10-CM | POA: Diagnosis not present

## 2023-11-08 DIAGNOSIS — Z6822 Body mass index (BMI) 22.0-22.9, adult: Secondary | ICD-10-CM | POA: Diagnosis not present

## 2023-11-08 DIAGNOSIS — M150 Primary generalized (osteo)arthritis: Secondary | ICD-10-CM | POA: Diagnosis not present

## 2023-11-08 DIAGNOSIS — K120 Recurrent oral aphthae: Secondary | ICD-10-CM | POA: Diagnosis not present

## 2023-11-08 DIAGNOSIS — R7303 Prediabetes: Secondary | ICD-10-CM | POA: Diagnosis not present

## 2023-11-08 DIAGNOSIS — I10 Essential (primary) hypertension: Secondary | ICD-10-CM | POA: Diagnosis not present

## 2023-11-08 DIAGNOSIS — D509 Iron deficiency anemia, unspecified: Secondary | ICD-10-CM | POA: Diagnosis not present

## 2023-11-16 ENCOUNTER — Encounter: Payer: PPO | Attending: Physical Medicine & Rehabilitation | Admitting: Physical Medicine & Rehabilitation

## 2023-11-16 ENCOUNTER — Ambulatory Visit
Admission: RE | Admit: 2023-11-16 | Discharge: 2023-11-16 | Disposition: A | Discharge status: Home / Self Care | Payer: PPO | Source: Ambulatory Visit | Attending: Physical Medicine & Rehabilitation | Admitting: Physical Medicine & Rehabilitation

## 2023-11-16 ENCOUNTER — Encounter: Payer: Self-pay | Admitting: Physical Medicine & Rehabilitation

## 2023-11-16 VITALS — BP 148/76 | HR 63 | Ht 63.0 in | Wt 129.0 lb

## 2023-11-16 DIAGNOSIS — M47817 Spondylosis without myelopathy or radiculopathy, lumbosacral region: Secondary | ICD-10-CM | POA: Diagnosis not present

## 2023-11-16 DIAGNOSIS — M419 Scoliosis, unspecified: Secondary | ICD-10-CM

## 2023-11-16 MED ORDER — TRAMADOL HCL 50 MG PO TABS: 50.0000 mg | ORAL_TABLET | Freq: Two times a day (BID) | ORAL | 5 refills | Status: DC

## 2023-11-16 NOTE — Progress Notes (Signed)
Subjective:    Patient ID: Sandra Tapia, female    DOB: 26-Sep-1946, 77 y.o.   MRN: [id number]  HPI 77 year old female with history of severe levoscoliosis last measured at 36 degrees in 2017 along with lumbar spondylosis which has responded in the past to lumbar radiofrequency neurotomies.  She is approximately 2 years post her last L3-4-5 radiofrequency neurotomy.  She is doing well taking tramadol 50 mg 1 to 2 tablets/day she is remain functionally independent with all self-care and mobility.  She has had no side effects with the tramadol except for some constipation which has been managed with laxatives prescribed by her primary care physician. Doing Pilates and stretching and walking  B[facility]   No falls or injuries   CLINICAL DATA:  Idiopathic scoliosis.   EXAM: THORACOLUMBAR SPINE 1V   COMPARISON:  July 26, 2008.   FINDINGS: There is noted 36 degrees of levoscoliosis of the lower thoracic and upper lumbar spine centered at the L1-2 level. Severe multilevel degenerative disc disease is noted at L2-3, L3-4, L4-5 and L5-S1. No fracture or spondylolisthesis is noted.   IMPRESSION: 36 degrees of levoscoliosis of the lower thoracic and upper lumbar spine which does not appear to be significantly changed compared to prior exam.     Electronically Signed   By: Lupita Raider, M.D.   On: 03/12/2016 14:33    Pain Inventory Average Pain 7 Pain Right Now 6 My pain is sharp and aching  In the last 24 hours, has pain interfered with the following? General activity 7 Relation with others 5 Enjoyment of life 4 What TIME of day is your pain at its worst? daytime Sleep (in general) Good  Pain is worse with: walking, sitting, standing, and some activites Pain improves with: heat/ice and medication Relief from Meds: 6  Family History  Problem Relation Age of Onset   Hypertension Father    Macular degeneration Father    Social History   Socioeconomic History    Marital status: Married    Spouse name: Not on file   Number of children: Not on file   Years of education: Not on file   Highest education level: Not on file  Occupational History   Not on file  Tobacco Use   Smoking status: Never   Smokeless tobacco: Never  Vaping Use   Vaping status: Never Used  Substance and Sexual Activity   Alcohol use: Not Currently    Alcohol/week: 7.0 standard drinks of alcohol    Types: 7 drink(s) per week    Comment: 6-7 glasses of wine a week   Drug use: No   Sexual activity: Yes    Partners: Male    Birth control/protection: Post-menopausal  Other Topics Concern   Not on file  Social History Narrative   Not on file   Social Determinants of Health   Financial Resource Strain: Not on file  Food Insecurity: Not on file  Transportation Needs: Not on file  Physical Activity: Not on file  Stress: Not on file  Social Connections: Not on file   Past Surgical History:  Procedure Laterality Date   ANKLE SURGERY     BREAST SURGERY  8/09   (Rt)Lumpectomy,sentinel node   EYE SURGERY     MASTECTOMY, PARTIAL Right 07/31/08   right node Bx (neg)   RETINAL DETACHMENT SURGERY  june 28th 2012   TONSILLECTOMY AND ADENOIDECTOMY     Past Surgical History:  Procedure Laterality Date   ANKLE  SURGERY     BREAST SURGERY  8/09   (Rt)Lumpectomy,sentinel node   EYE SURGERY     MASTECTOMY, PARTIAL Right 07/31/08   right node Bx (neg)   RETINAL DETACHMENT SURGERY  june 28th 2012   TONSILLECTOMY AND ADENOIDECTOMY     Past Medical History:  Diagnosis Date   Aneurysm of aorta ([facility]) 8/09   4.1cm ascending aortic aneurysm on breast MRI   Basal cell cancer    Cancer ([facility]) 07/2008   breast cancer(rt)   Degenerative disc disease    neck   Elevated cholesterol    Fibroid    Hypertension    Infertility, female    Spinal arthritis    BP (!) 148/76   Pulse 63   Ht 5\' 3"  (1.6 m)   Wt 129 lb (58.5 kg)   SpO2 93%   BMI 22.85 kg/m   Opioid Risk Score:    Fall Risk Score:  `1  Depression screen [facility] 2/9     03/24/2022   10:46 AM 01/20/2022    1:18 PM 11/27/2021   12:47 PM 07/16/2020   12:12 PM 05/02/2018   11:11 AM 03/12/2016   11:28 AM 02/13/2016   12:30 PM  Depression screen PHQ 2/9  Decreased Interest 0 0 0 0 0 0 0  Down, Depressed, Hopeless 0 0 0 0 0 0 0  PHQ - 2 Score 0 0 0 0 0 0 0      Review of Systems  Musculoskeletal:  Positive for back pain.  All other systems reviewed and are negative.     Objective:   Physical Exam Severe levoscoliosis no tenderness along the paraspinal area bilaterally.  No tenderness in the PSIS area bilaterally. Negative straight leg raising bilaterally Lumbar flexion is normal lateral bending is limited in each direction.  Ostomy 25 to 50%.  Lumbar extension limited to neutral. Motor strength is 5/5 bilateral hip flexor knee extensor ankle dorsiflexor Ambulates without assistive device no evidence toe drag or knee instability Mood and affect are appropriate General No acute distress       Assessment & Plan:   1.  Chronic low back pain the patient has been both severe levoscoliosis centered at L1 to as well as concomitant facet arthropathy multilevel in the lumbar spine.  Symptomatically she points to the L1-3 area on the left side. We discussed that it has been a number of years since her last thoracolumbar x-rays. Will review  She has had no respiratory compromise she has had no nerve root impingement.  Will repeat thoracolumbar x-rays.  Referral for  spine surgery consultation was offered but pt did not want to pursue at this time   RTC 6 mo cont TRamadol 50mg  BID UDS next visit

## 2023-11-16 NOTE — Patient Instructions (Signed)
[address] [facility]

## 2023-12-15 DIAGNOSIS — L820 Inflamed seborrheic keratosis: Secondary | ICD-10-CM | POA: Diagnosis not present

## 2023-12-28 ENCOUNTER — Telehealth: Payer: Self-pay | Admitting: Internal Medicine

## 2023-12-28 MED ORDER — AZITHROMYCIN 250 MG PO TABS: ORAL_TABLET | ORAL | 0 refills | Status: AC

## 2023-12-28 NOTE — Telephone Encounter (Signed)
PT calling for appt w/Dr. Sherene Sires. Nothing avail. She has a ongoing cough. Adv I would have a nurse call to see if we could resolve by calling in something.  Her # is [phone]  Pharm: [facility] in [place]

## 2023-12-28 NOTE — Telephone Encounter (Signed)
 Called and spoke to patient.  She is requesting sooner appt with Dr. Darlean then 02/18/2024. She has had an ongoing cough for 2 weeks, however it has worsened over the past 3 days. Cough is prod with green sputum. Some wheezing. Denied f/c/s or additional sx. She does have any inhalers.  Dr. Darlean, please advise. Thanks

## 2023-12-28 NOTE — Telephone Encounter (Signed)
 Zpak Mucinex dm 1200 mg bid  F/u next available with me or NP

## 2023-12-28 NOTE — Telephone Encounter (Signed)
Pt is aware of below message/recommendations and voiced her understanding. Zpak sent to preferred pharmacy.  She will keep scheduled visit for 02/18/2024. Nothing further needed.

## 2024-01-28 ENCOUNTER — Ambulatory Visit: Payer: PPO | Admitting: Internal Medicine

## 2024-02-17 NOTE — Progress Notes (Signed)
 Sandra Tapia, female    DOB: May 23, 1946    MRN: [id number]     Brief patient profile:  36 yowf never smoker  MZ phenotype with  "bronchitis" as child (exposed to father's smoking) and did track in HS with onset of hoarseness, runny nose in springtime around early 2000  some itching / sneezing evolving to year round some better on allegra / flonase then onset abruptly Nov 28 2017 hoarseness and coughing and wheezing rx omeprazole by Dr Uvaldo Rising and seemed to improve on zantac in am and ppi otc in pm and then gradually worse since May 2019 attributed to zantac going off the market and since then cough worsened since then and changed to omeprazole 20 mg bid ac and better but cough persisted so referred to pulmonary clinic 12/26/2018 by Dr  Uvaldo Rising.     History of Present Illness   12/26/2018  Pulmonary/ 1st office eval/Sandra Tapia  Chief Complaint  Patient presents with   Pulmonary Consult    Referred by Dr Selena Batten.  Pt c/o cough since Dec 2018. Cough is occ prod with yellow sputum.    Dyspnea:  Not limited by breathing from desired activities   Cough: worse in late am's, better with cough drops / min slt yellow mucus Sleep: able to lie flat, one pillow  SABA use: none  rec Stop lisinopril and start micardis 40-12.5 one daily (take a half if too strong, take two if too weak Omeprazole 20 mg Take 30- 60 min before your first and last meals of the day (until 100% better, then just once a day should do. GERD diet   07/26/2019  f/u ov/Sandra Tapia re:  Bronchiectasis with MZ phenotype, acei case Chief Complaint  Patient presents with   Follow-up    PFT's done today. She states her cough has been better. No new co's.   Dyspnea:  Not limited by breathing from desired activities  / walks daily up some hill, good pace no problem Cough: no but constant sense of pnds some  better nose burns from the astelin   Sleeping: ok flat one pillow  SABA use: none  02: none  rec Your alpha one Anti-trypsin is  MZ  - the Z gene risks emphysema and bronchiectasis especially if you smoke  Dymista has astelin plus flonase would be a good option for you if not doing well with just the astelin pointer toward you ear on same side      Zpak called in 12/28/23   02/18/2024  f/u ov/Sandra Tapia re: bronchiectasis / MZ  maint on omeprazole 20 mg / pepcid   Chief Complaint  Patient presents with   Follow-up    Had cough for 2 months started in December 2024.  Zpak sent in.  Sx improved beginning of February 2025.  Patient wanted to follow up.   Dyspnea:   Not limited by breathing from desired activities   Cough: resolved  Sleeping: flat bed bunch of pillows s  resp cc  SABA use: none  02: none     No obvious day to day or daytime variability or assoc excess/ purulent sputum or mucus plugs or hemoptysis or cp or chest tightness, subjective wheeze or overt sinus or hb symptoms.    Also denies any obvious fluctuation of symptoms with weather or environmental changes or other aggravating or alleviating factors except as outlined above   No unusual exposure hx or h/o childhood pna/ asthma or knowledge of premature birth.  Current Allergies,  Complete Past Medical History, Past Surgical History, Family History, and Social History were reviewed in [employer] record.  ROS  The following are not active complaints unless bolded Hoarseness, sore throat, dysphagia, dental problems, itching, sneezing,  nasal congestion or discharge of excess mucus or purulent secretions, ear ache,   fever, chills, sweats, unintended wt loss or wt gain, classically pleuritic or exertional cp,  orthopnea pnd or arm/hand swelling  or leg swelling, presyncope, palpitations, abdominal pain, anorexia, nausea, vomiting, diarrhea  or change in bowel habits or change in bladder habits, change in stools or change in urine, dysuria, hematuria,  rash, arthralgias, visual complaints, headache, numbness, weakness or ataxia or problems  with walking or coordination,  change in mood or  memory.        Current Meds  Medication Sig   ALPHAGAN P 0.1 % SOLN Place 1 drop into the left eye 2 (two) times daily.    amLODipine (NORVASC) 5 MG tablet Take 5 mg by mouth daily.   atorvastatin (LIPITOR) 40 MG tablet Take 40 mg by mouth daily.   Biotin 10 MG CAPS Take 1 tablet by mouth daily. 1 tablet   Calcium Carb-Cholecalciferol (CALCIUM 600 + D PO) Take 2 tablets by mouth daily.   cetirizine (ZYRTEC) 10 MG tablet Take 10 mg by mouth daily.   diclofenac (VOLTAREN) 75 MG EC tablet Take 1 tablet (75 mg total) by mouth 2 (two) times daily.   hydrochlorothiazide (HYDRODIURIL) 12.5 MG tablet Take 12.5 mg by mouth daily.   ketorolac (ACULAR) 0.5 % ophthalmic solution Apply to eye.   mesalamine (LIALDA) 1.2 g EC tablet Take 1.2 g by mouth daily with breakfast.   Multiple Vitamin (MULTIVITAMIN) tablet Take 1 tablet by mouth daily.   omeprazole (PRILOSEC) 40 MG capsule Take 40 mg by mouth every morning.   pilocarpine (PILOCAR) 2 % ophthalmic solution Apply to eye.   traMADol (ULTRAM) 50 MG tablet Take 1 tablet (50 mg total) by mouth 2 (two) times daily.                Objective:     wts  02/18/2024        128  08/10/2023        126   08/03/2022          131  07/29/2021          128  07/29/2020          125   01/25/19 126 lb 9.6 oz (57.4 kg)  12/26/18 126 lb 12.8 oz (57.5 kg)  10/07/18 125 lb 6.4 oz (56.9 kg)     Vital signs reviewed  02/18/2024  - Note at rest 02 sats  97% on RA   General appearance:    amb pleasant wf nad    HEENT : Oropharynx  clear      Nasal turbinates nl    NECK :  without  apparent JVD/ palpable Nodes/TM    LUNGS: no acc muscle use,  Mild kyphoscoliotic  contour chest  with a few insp crackles R Base    CV:  RRR  no s3 or   2/6 HSM  increase in P2, and no edema   ABD:  soft and nontender   MS:  Gait nl   ext warm without deformities Or obvious joint restrictions  calf tenderness, cyanosis or clubbing     SKIN: warm and dry without lesions    NEURO:  alert, approp, nl sensorium with  no motor or cerebellar deficits apparent.      CXR PA and Lateral:   02/18/2024 :    I personally reviewed images and impression is as follows:     No acute findings       Assessment

## 2024-02-18 ENCOUNTER — Encounter: Payer: Self-pay | Admitting: Internal Medicine

## 2024-02-18 ENCOUNTER — Ambulatory Visit (INDEPENDENT_AMBULATORY_CARE_PROVIDER_SITE_OTHER): Payer: PPO | Admitting: Internal Medicine

## 2024-02-18 ENCOUNTER — Ambulatory Visit: Payer: PPO

## 2024-02-18 VITALS — BP 114/64 | HR 60 | Temp 97.9°F | Ht 63.0 in | Wt 128.2 lb

## 2024-02-18 DIAGNOSIS — R0989 Other specified symptoms and signs involving the circulatory and respiratory systems: Secondary | ICD-10-CM | POA: Diagnosis not present

## 2024-02-18 DIAGNOSIS — J479 Bronchiectasis, uncomplicated: Secondary | ICD-10-CM | POA: Diagnosis not present

## 2024-02-18 DIAGNOSIS — K449 Diaphragmatic hernia without obstruction or gangrene: Secondary | ICD-10-CM | POA: Diagnosis not present

## 2024-02-18 DIAGNOSIS — R918 Other nonspecific abnormal finding of lung field: Secondary | ICD-10-CM | POA: Diagnosis not present

## 2024-02-18 NOTE — Patient Instructions (Addendum)
 For cough > mucinex dm up to 1200 mg every 12 hours as needed   Remember at onset of any cough> omeprazole should be increased to Take 30- 60 min before your first and last meals of the day   GERD (REFLUX)  is an extremely common cause of respiratory symptoms just like yours , many times with no obvious heartburn at all.    It can be treated with medication, but also with lifestyle changes including elevation of the head of your bed (ideally with 6 -8inch blocks under the headboard of your bed),  Smoking cessation, avoidance of late meals, excessive alcohol, and avoid fatty foods, chocolate, peppermint, colas, red wine, and acidic juices such as orange juice.  NO MINT OR MENTHOL PRODUCTS SO NO COUGH DROPS -  except LUDEN's ok  USE SUGARLESS CANDY INSTEAD (Jolley ranchers or Stover's or Life Savers) or even ice chips will also do - the key is to swallow to prevent all throat clearing. NO OIL BASED VITAMINS - use powdered substitutes.  Avoid fish oil when coughing.    Please remember to go to the  x-ray department  for your tests - we will call you with the results when they are available    Please schedule a follow up visit in 12  months but call sooner if needed   Add:  zpak prn refillable for nasty mucus

## 2024-02-19 ENCOUNTER — Telehealth: Payer: Self-pay | Admitting: Internal Medicine

## 2024-02-19 MED ORDER — AZITHROMYCIN 250 MG PO TABS: ORAL_TABLET | ORAL | 11 refills | Status: DC

## 2024-02-19 NOTE — Telephone Encounter (Signed)
 Let her know I wrote for refillable zpak prn nasty mucus (dark yellow/ green/brown or bloody, any of which is possible)

## 2024-02-22 NOTE — Telephone Encounter (Signed)
 Called patient.  Gave information per Dr. Sherene Sires.  Patient verbalized understanding.

## 2024-02-25 ENCOUNTER — Ambulatory Visit: Payer: PPO | Admitting: Cardiology

## 2024-02-28 NOTE — Progress Notes (Signed)
Spoke with pt and notified of results per Dr. Wert. Pt verbalized understanding and denied any questions. 

## 2024-03-17 ENCOUNTER — Ambulatory Visit (INDEPENDENT_AMBULATORY_CARE_PROVIDER_SITE_OTHER): Payer: PPO | Admitting: Cardiology

## 2024-03-17 ENCOUNTER — Ambulatory Visit: Payer: PPO | Attending: Cardiology

## 2024-03-17 ENCOUNTER — Encounter: Payer: Self-pay | Admitting: Cardiology

## 2024-03-17 VITALS — BP 150/90 | HR 57 | Ht 63.0 in | Wt 128.0 lb

## 2024-03-17 DIAGNOSIS — E7800 Pure hypercholesterolemia, unspecified: Secondary | ICD-10-CM | POA: Insufficient documentation

## 2024-03-17 DIAGNOSIS — I10 Essential (primary) hypertension: Secondary | ICD-10-CM | POA: Insufficient documentation

## 2024-03-17 DIAGNOSIS — I77810 Thoracic aortic ectasia: Secondary | ICD-10-CM

## 2024-03-17 LAB — ECHOCARDIOGRAM COMPLETE
Area-P 1/2: 2.5 cm2
P 1/2 time: 665 ms
S' Lateral: 2.9 cm

## 2024-03-17 NOTE — Patient Instructions (Addendum)
 Follow-Up: At [facility], you and your health needs are our priority.  As part of our continuing mission to provide you with exceptional heart care, we have created designated Provider Care Teams.  These Care Teams include your primary Cardiologist (physician) and Advanced Practice Providers (APPs -  Physician Assistants and Nurse Practitioners) who all work together to provide you with the care you need, when you need it.   Your next appointment:   1 year(s)  Provider:   Donato Schultz, MD      1st Floor: - Lobby - Registration  - Pharmacy  - Lab - Cafe  2nd Floor: - PV Lab - Diagnostic Testing (echo, CT, nuclear med)  3rd Floor: - Vacant  4th Floor: - TCTS (cardiothoracic surgery) - AFib Clinic - Structural Heart Clinic - Vascular Surgery  - Vascular Ultrasound  5th Floor: - HeartCare Cardiology (general and EP) - Clinical Pharmacy for coumadin, hypertension, lipid, weight-loss medications, and med management appointments    Valet parking services will be available as well.

## 2024-03-17 NOTE — Progress Notes (Signed)
 Cardiology Office Note:  .   Date:  03/17/2024  ID:  Sandra Tapia, DOB 1945-12-30, MRN [id number] PCP: Gweneth Dimitri, MD  [facility] Providers Cardiologist:  Donato Schultz, MD     History of Present Illness: .   Sandra Tapia is a 78 y.o. female Discussed the use of AI scribe software for clinical note transcription with the patient, who gave verbal consent to proceed.  History of Present Illness Sandra Tapia is a 78 year old female with ascending aortic dilatation who presents for follow-up.  She is being monitored for ascending aortic dilatation, with the most recent echocardiogram on March 16, 2023, showing a stable measurement of 4.4 cm. Previous studies have shown consistent measurements, with aortic root sizes ranging from 4.1 to 4.4 cm. A prior MRI of the chest revealed no coarctation. There is a family history of aortopathy, as her father underwent a triple A repair.  She has hypertension, currently managed with amlodipine 5 mg daily and hydrochlorothiazide 12.5 mg daily. Her blood pressure today was 150/90 mmHg, although she usually records readings around 114/60 mmHg. She experiences some swelling in her left leg, which is more pronounced than in the right.  She has a longstanding history of bronchiectasis, leading to shortness of breath during physical activities. She can walk nearly two miles in thirty minutes on most days but experiences fatigue with activities like house cleaning and traveling to visit her mother. No chest pain or heaviness during walks.  She has a history of colitis, which has been flaring up but is currently improving. She attributes some of her fatigue to this condition.  She is on atorvastatin 40 mg daily for hyperlipidemia.      ROS: No CP  Studies Reviewed: Marland Kitchen   EKG Interpretation Date/Time:  Friday March 17 2024 08:23:15 EDT Ventricular Rate:  57 PR Interval:  160 QRS Duration:  92 QT Interval:  446 QTC  Calculation: 434 R Axis:   -22  Text Interpretation: Sinus bradycardia Non-specific ST-t changes When compared with ECG of 01-Sep-2006 11:39, No significant change since last tracing Confirmed by Donato Schultz ([id number]) on 03/17/2024 8:29:05 AM    Results RADIOLOGY MRI of chest: No evidence of coarctation  DIAGNOSTIC Echocardiogram: Ascending aorta 4.4 cm, stable (03/16/2023) EKG: Normal (03/17/2024) Risk Assessment/Calculations:           Physical Exam:   VS:  BP (!) 150/90   Pulse (!) 57   Ht 5\' 3"  (1.6 m)   Wt 128 lb (58.1 kg)   SpO2 98%   BMI 22.67 kg/m    Wt Readings from Last 3 Encounters:  03/17/24 128 lb (58.1 kg)  02/18/24 128 lb 3.2 oz (58.2 kg)  11/16/23 129 lb (58.5 kg)    GEN: Well nourished, well developed in no acute distress NECK: No JVD; No carotid bruits CARDIAC: RRR, no murmurs, no rubs, no gallops RESPIRATORY:  Clear to auscultation without rales, wheezing or rhonchi  ABDOMEN: Soft, non-tender, non-distended EXTREMITIES:  No edema; No deformity   ASSESSMENT AND PLAN: .    Assessment and Plan Assessment & Plan Dilated aortic root The ascending aorta measures 4.4 cm, consistent with previous measurements ranging from 4.1 to 4.4 cm. Family history of aortopathy is noted, as her father underwent AAA repair. Prior chest MRI showed no coarctation. She has mild to moderate aortic valve regurgitation. Annual monitoring is recommended. - Order annual echocardiogram to monitor aortic root size  Hypertension Blood pressure is elevated at 150/90  mmHg, though typically around 114/60 mmHg. She is on amlodipine 5 mg and hydrochlorothiazide 12.5 mg daily. Reports left leg swelling, likely due to amlodipine. Consultation with primary care physician for alternative antihypertensive strategies is advised. - Consult with primary care physician regarding antihypertensive regimen adjustments due to side effects and elevated blood pressure  Dependent edema Reports left leg  swelling, more pronounced throughout the day, likely due to dependent edema and possibly exacerbated by amlodipine. Conservative management includes compression socks and leg elevation. - Recommend compression socks - Advise leg elevation during rest  Bronchiectasis Longstanding bronchiectasis with right basilar crackles. Contributes to exertional dyspnea. Continued physical activity is encouraged to maintain lung function. - Encourage physical activity, such as walking, to maintain lung function  Hyperlipidemia On atorvastatin 40 mg daily. No issues or changes discussed.  Colitis Colitis is currently improved but may contribute to fatigue. Continued monitoring is implied.  Follow-up Emphasized the importance of monitoring for unusual symptoms and seeking immediate care for symptoms like crushing or tearing chest pain. - Schedule follow-up in one year with echocardiogram to monitor aortic root size - Advise contacting provider if experiencing unusual symptoms, such as crushing or tearing chest pain           Signed, Donato Schultz, MD

## 2024-03-18 ENCOUNTER — Encounter: Payer: Self-pay | Admitting: Cardiology

## 2024-03-20 ENCOUNTER — Other Ambulatory Visit: Payer: Self-pay | Admitting: *Deleted

## 2024-03-20 DIAGNOSIS — I77810 Thoracic aortic ectasia: Secondary | ICD-10-CM

## 2024-03-22 ENCOUNTER — Encounter: Payer: Self-pay | Admitting: Physical Medicine & Rehabilitation

## 2024-04-04 ENCOUNTER — Encounter: Attending: Physical Medicine & Rehabilitation | Admitting: Physical Medicine & Rehabilitation

## 2024-04-04 ENCOUNTER — Encounter: Payer: Self-pay | Admitting: Physical Medicine & Rehabilitation

## 2024-04-04 VITALS — BP 145/82 | HR 53 | Wt 129.0 lb

## 2024-04-04 DIAGNOSIS — M419 Scoliosis, unspecified: Secondary | ICD-10-CM | POA: Insufficient documentation

## 2024-04-04 NOTE — Patient Instructions (Signed)
 Sharolyn Douglas MD

## 2024-04-04 NOTE — Progress Notes (Signed)
 Subjective:    Patient ID: Sandra Tapia, female    DOB: [birth year], 78 y.o.   MRN: [id number]  HPI 78 year old female with history of levoscoliosis and chronic low back pain.  Her levoscoliosis is upper lumbar area at the center.  She also has a left lumbar facet mediated pain that has responded well to L3 for 5 radiofrequency neurotomy in the past and has had a 50% relief of pain for greater than 1 year We discussed her last x-rays, scoliosis views which measured her current levoscoliosis centered at L1-2 at 56 degrees.  Prior films were 2017 with a 41 degree curve The patient is not having breathing issues however she feels like her left ribs are on her left pelvic bone She has no pain shooting into her legs  Pain Inventory Average Pain 7 Pain Right Now 7 My pain is sharp, stabbing, tingling, and aching  In the last 24 hours, has pain interfered with the following? General activity 6 Relation with others 4 Enjoyment of life 6 What TIME of day is your pain at its worst? daytime Sleep (in general) Good  Pain is worse with: walking, sitting, standing, and some activites  riding in car Pain improves with: rest, therapy/exercise, pacing activities, and medication Relief from Meds: 6  Family History  Problem Relation Age of Onset   Hypertension Father    Macular degeneration Father    Social History   Socioeconomic History   Marital status: Married    Spouse name: Not on file   Number of children: Not on file   Years of education: Not on file   Highest education level: Not on file  Occupational History   Not on file  Tobacco Use   Smoking status: Never   Smokeless tobacco: Never  Vaping Use   Vaping status: Never Used  Substance and Sexual Activity   Alcohol use: Not Currently    Alcohol/week: 7.0 standard drinks of alcohol    Types: 7 drink(s) per week    Comment: 6-7 glasses of wine a week   Drug use: No   Sexual activity: Yes    Partners: Male    Birth  control/protection: Post-menopausal  Other Topics Concern   Not on file  Social History Narrative   Not on file   Social Drivers of Health   Financial Resource Strain: Not on file  Food Insecurity: Not on file  Transportation Needs: Not on file  Physical Activity: Not on file  Stress: Not on file  Social Connections: Not on file   Past Surgical History:  Procedure Laterality Date   ANKLE SURGERY     BREAST SURGERY  8/09   (Rt)Lumpectomy,sentinel node   EYE SURGERY     MASTECTOMY, PARTIAL Right 07/31/08   right node Bx (neg)   RETINAL DETACHMENT SURGERY  june 28th 2012   TONSILLECTOMY AND ADENOIDECTOMY     Past Surgical History:  Procedure Laterality Date   ANKLE SURGERY     BREAST SURGERY  8/09   (Rt)Lumpectomy,sentinel node   EYE SURGERY     MASTECTOMY, PARTIAL Right 07/31/08   right node Bx (neg)   RETINAL DETACHMENT SURGERY  june 28th 2012   TONSILLECTOMY AND ADENOIDECTOMY     Past Medical History:  Diagnosis Date   Aneurysm of aorta (HCC) 8/09   4.1cm ascending aortic aneurysm on breast MRI   Basal cell cancer    Cancer (HCC) 07/2008   breast cancer(rt)   Degenerative disc disease  neck   Elevated cholesterol    Fibroid    Hypertension    Infertility, female    Spinal arthritis    BP (!) 145/82   Pulse (!) 53   Wt 129 lb (58.5 kg)   SpO2 94%   BMI 22.85 kg/m   Opioid Risk Score:   Fall Risk Score:  `1  Depression screen PHQ 2/9     04/04/2024    2:28 PM 03/24/2022   10:46 AM 01/20/2022    1:18 PM 11/27/2021   12:47 PM 07/16/2020   12:12 PM 05/02/2018   11:11 AM 03/12/2016   11:28 AM  Depression screen PHQ 2/9  Decreased Interest 0 0 0 0 0 0 0  Down, Depressed, Hopeless 0 0 0 0 0 0 0  PHQ - 2 Score 0 0 0 0 0 0 0     Review of Systems  All other systems reviewed and are negative.      Objective:   Physical Exam  Marked levoscoliosis centered at the upper lumbar area mild compensatory S at the lumbosacral junction. Motor strength is 5/5  bilateral hip flexor knee extensor ankle dorsiflexor Ambulates without assistive device no evidence of toe drag or instability Spine has tenderness palpation left L4-L5 paraspinal area. Pain with extension greater than with flexion.  Limited lumbar range of motion around 25% Mood and affect appropriate General no acute distress      Assessment & Plan:   1.  Marked and progressive lumbar scoliosis.  We discussed conservative care options including bracing which is unlikely to cause any correction and at this point unlikely to prevent progression. We discussed recommendation for spine surgery evaluation Dr. Sharolyn Douglas 2.  Lumbar spondylosis without myelopathy with chronic pain only partially responsive to tramadol.  She has had excellent response to the left L3-4 medial branch L5 dorsal ramus radiofrequency neurotomy last performed 10/07/2021 with greater than 1 year relief of greater than 50%.  She has had slowly increasing pain again this is distinct from her scoliotic area.  Would recommend repeat procedure

## 2024-04-11 ENCOUNTER — Other Ambulatory Visit (HOSPITAL_COMMUNITY)

## 2024-05-01 NOTE — Progress Notes (Unsigned)
  PROCEDURE RECORD [facility]   Name: Sandra Tapia DOB:19-Sep-1946 MRN: [id number]  Date:05/01/2024  Physician: Janeece Mechanic, MD    Nurse/CMA: [facility] RN  Allergies:  Allergies  Allergen Reactions   Lisinopril     Other reaction(s): cough   Penicillins     Nausea & stomach pains Other reaction(s): severe stomach pain   Telmisartan      Other reaction(s): angioedema   Adhesive [Tape] Rash    Gel grounding pads    Consent Signed: {yes no:[id number]}  Is patient diabetic? No.  CBG today?   Pregnant: No. LMP: No LMP recorded. Patient is postmenopausal. (age 78-55)  Anticoagulants: no Anti-inflammatory: no Antibiotics: no  Procedure: LEFT L3-4-5 RADIOFREQUENCY NEUROTOMOY  Position: Prone Start Time: ***  End Time: ***  Fluoro Time: ***  RN/CMA Breyer Tejera RN Courtany Mcmurphy RN    Time *** ***    BP *** ***    Pulse *** ***    Respirations *** ***    O2 Sat *** ***    S/S *** ***    Pain Level *** ***     D/C home with ***, [name], D/C instructions reviewed, and sits independently.

## 2024-05-02 ENCOUNTER — Encounter: Payer: Self-pay | Admitting: Physical Medicine & Rehabilitation

## 2024-05-02 ENCOUNTER — Encounter: Attending: Physical Medicine & Rehabilitation | Admitting: Physical Medicine & Rehabilitation

## 2024-05-02 VITALS — BP 142/81 | HR 56 | Ht 63.0 in | Wt 127.4 lb

## 2024-05-02 DIAGNOSIS — M47817 Spondylosis without myelopathy or radiculopathy, lumbosacral region: Secondary | ICD-10-CM | POA: Insufficient documentation

## 2024-05-02 MED ORDER — LIDOCAINE HCL 1 % IJ SOLN
10.0000 mL | Freq: Once | INTRAMUSCULAR | Status: AC
  Administered 2024-05-02: 10 mL

## 2024-05-02 MED ORDER — LIDOCAINE HCL (PF) 2 % IJ SOLN
5.0000 mL | Freq: Once | INTRAMUSCULAR | Status: AC
  Administered 2024-05-02: 5 mL

## 2024-05-02 NOTE — Progress Notes (Signed)
Left L5 dorsal ramus., left L4 and left L3 medial branch radio frequency neurotomy under fluoroscopic guidance  Indication: Low back pain due to lumbar spondylosis which has been relieved on 2 occasions by greater than 50% by lumbar medial branch blocks at corresponding levels.  Informed consent was obtained after describing risks and benefits of the procedure with the patient, this includes bleeding, bruising, infection, paralysis and medication side effects. The patient wishes to proceed and has given written consent. The patient was placed in a prone position. The lumbar and sacral area was marked and prepped with Betadine. A 25-gauge 1-1/2 inch needle was inserted into the skin and subcutaneous tissue at 3 sites in one ML of 1% lidocaine was injected into each site. Then a 18-gauge 10 cm radio frequency needle with a 1 cm curved active tip was inserted targeting the left S1 SAP/sacral ala junction. Bone contact was made and confirmed with lateral imaging.  motor stimulation at 2 Hz confirm proper needle location followed by injection of one ML of 2% MPF lidocaine. Then the left L5 SAP/transverse process junction was targeted. Bone contact was made and confirmed with lateral imaging motor stimulation at 2 Hz confirm proper needle location followed by injection of one ML of the solution containing one ML of  2% MPF lidocaine. Then the left L4 SAP/transverse process junction was targeted. Bone contact was made and confirmed with lateral imaging. motor stimulation at 2 Hz confirm proper needle location followed by injection of one ML of the solution containing one ML of2% MPF lidocaine. Radio frequency lesion  at 80C for 90 seconds was performed. Needles were removed. Post procedure instructions and vital signs were performed. Patient tolerated procedure well. Followup appointment was given.  

## 2024-05-02 NOTE — Patient Instructions (Signed)
You had a radio frequency procedure today This was done to alleviate joint pain in your lumbar area We injected lidocaine which is a local anesthetic.  You may experience soreness at the injection sites. You may also experienced some irritation of the nerves that were heated I'm recommending ice for 30 minutes every 2 hours as needed for the next 24-48 hours   

## 2024-05-05 DIAGNOSIS — H353132 Nonexudative age-related macular degeneration, bilateral, intermediate dry stage: Secondary | ICD-10-CM | POA: Diagnosis not present

## 2024-05-05 DIAGNOSIS — H26491 Other secondary cataract, right eye: Secondary | ICD-10-CM | POA: Diagnosis not present

## 2024-05-05 DIAGNOSIS — H43391 Other vitreous opacities, right eye: Secondary | ICD-10-CM | POA: Diagnosis not present

## 2024-05-05 DIAGNOSIS — H59812 Chorioretinal scars after surgery for detachment, left eye: Secondary | ICD-10-CM | POA: Diagnosis not present

## 2024-05-05 DIAGNOSIS — H40052 Ocular hypertension, left eye: Secondary | ICD-10-CM | POA: Diagnosis not present

## 2024-05-05 DIAGNOSIS — Z8669 Personal history of other diseases of the nervous system and sense organs: Secondary | ICD-10-CM | POA: Diagnosis not present

## 2024-05-05 DIAGNOSIS — H353131 Nonexudative age-related macular degeneration, bilateral, early dry stage: Secondary | ICD-10-CM | POA: Diagnosis not present

## 2024-05-05 DIAGNOSIS — Z961 Presence of intraocular lens: Secondary | ICD-10-CM | POA: Diagnosis not present

## 2024-05-05 DIAGNOSIS — H35431 Paving stone degeneration of retina, right eye: Secondary | ICD-10-CM | POA: Diagnosis not present

## 2024-05-05 DIAGNOSIS — H35352 Cystoid macular degeneration, left eye: Secondary | ICD-10-CM | POA: Diagnosis not present

## 2024-05-10 DIAGNOSIS — M5416 Radiculopathy, lumbar region: Secondary | ICD-10-CM | POA: Diagnosis not present

## 2024-05-10 DIAGNOSIS — M4155 Other secondary scoliosis, thoracolumbar region: Secondary | ICD-10-CM | POA: Diagnosis not present

## 2024-05-10 DIAGNOSIS — M48062 Spinal stenosis, lumbar region with neurogenic claudication: Secondary | ICD-10-CM | POA: Diagnosis not present

## 2024-05-12 ENCOUNTER — Ambulatory Visit: Payer: PPO | Admitting: Physical Medicine & Rehabilitation

## 2024-05-24 DIAGNOSIS — Z79899 Other long term (current) drug therapy: Secondary | ICD-10-CM | POA: Diagnosis not present

## 2024-05-24 DIAGNOSIS — R7303 Prediabetes: Secondary | ICD-10-CM | POA: Diagnosis not present

## 2024-06-02 ENCOUNTER — Encounter: Attending: Physical Medicine & Rehabilitation | Admitting: Physical Medicine & Rehabilitation

## 2024-06-02 ENCOUNTER — Encounter: Payer: Self-pay | Admitting: Physical Medicine & Rehabilitation

## 2024-06-02 VITALS — BP 103/63 | HR 62 | Ht 63.0 in | Wt 128.0 lb

## 2024-06-02 DIAGNOSIS — G894 Chronic pain syndrome: Secondary | ICD-10-CM | POA: Diagnosis not present

## 2024-06-02 DIAGNOSIS — Z5181 Encounter for therapeutic drug level monitoring: Secondary | ICD-10-CM

## 2024-06-02 DIAGNOSIS — Z79899 Other long term (current) drug therapy: Secondary | ICD-10-CM | POA: Diagnosis not present

## 2024-06-02 MED ORDER — TRAMADOL HCL 50 MG PO TABS: 50.0000 mg | ORAL_TABLET | Freq: Two times a day (BID) | ORAL | 5 refills | Status: DC

## 2024-06-02 NOTE — Progress Notes (Signed)
 Subjective:    Patient ID: Sandra Tapia, female    DOB: [birth year], 78 y.o.   MRN: [id number]  HPI 78 year old female with history of chronic low back pain.  She also has a history of progressive lumbar scoliosis.  She was referred to orthopedic spine surgery and after evaluation felt that her osteoporosis was too severe to safely perform extensive thoraco lumbar fusion. The patient did undergo left L3 and L4-L5 radiofrequency neurotomy on 05/02/2024.  Had no postprocedure complications.  Feels like the right sided low back pain is more severe and the left side is doing better.  Her last right-sided L3-L4-L5 radiofrequency neurotomy was performed in 2022.  She has had 50% relief for greater than 6 months Pain Inventory Average Pain 7 Pain Right Now 7 My pain is intermittent, constant, sharp, dull, and aching  In the last 24 hours, has pain interfered with the following? General activity 6 Relation with others 3 Enjoyment of life 3 What TIME of day is your pain at its worst? daytime Sleep (in general) Good  Pain is worse with: walking, sitting, and standing Pain improves with: rest, therapy/exercise, medication, and heat Relief from Meds: 10  Family History  Problem Relation Age of Onset   Hypertension Father    Macular degeneration Father    Social History   Socioeconomic History   Marital status: Married    Spouse name: Not on file   Number of children: Not on file   Years of education: Not on file   Highest education level: Not on file  Occupational History   Not on file  Tobacco Use   Smoking status: Never   Smokeless tobacco: Never  Vaping Use   Vaping status: Never Used  Substance and Sexual Activity   Alcohol use: Not Currently    Alcohol/week: 7.0 standard drinks of alcohol    Types: 7 drink(s) per week    Comment: 6-7 glasses of wine a week   Drug use: No   Sexual activity: Yes    Partners: Male    Birth control/protection: Post-menopausal  Other  Topics Concern   Not on file  Social History Narrative   Not on file   Social Drivers of Health   Financial Resource Strain: Not on file  Food Insecurity: Not on file  Transportation Needs: Not on file  Physical Activity: Not on file  Stress: Not on file  Social Connections: Not on file   Past Surgical History:  Procedure Laterality Date   ANKLE SURGERY     BREAST SURGERY  8/09   (Rt)Lumpectomy,sentinel node   EYE SURGERY     MASTECTOMY, PARTIAL Right 07/31/08   right node Bx (neg)   RETINAL DETACHMENT SURGERY  june 28th 2012   TONSILLECTOMY AND ADENOIDECTOMY     Past Surgical History:  Procedure Laterality Date   ANKLE SURGERY     BREAST SURGERY  8/09   (Rt)Lumpectomy,sentinel node   EYE SURGERY     MASTECTOMY, PARTIAL Right 07/31/08   right node Bx (neg)   RETINAL DETACHMENT SURGERY  june 28th 2012   TONSILLECTOMY AND ADENOIDECTOMY     Past Medical History:  Diagnosis Date   Aneurysm of aorta (HCC) 8/09   4.1cm ascending aortic aneurysm on breast MRI   Basal cell cancer    Cancer (HCC) 07/2008   breast cancer(rt)   Degenerative disc disease    neck   Elevated cholesterol    Fibroid    Hypertension  Infertility, female    Spinal arthritis    There were no vitals taken for this visit.  Opioid Risk Score:   Fall Risk Score:  `1  Depression screen [facility] 2/9     05/02/2024   11:09 AM 04/04/2024    2:28 PM 03/24/2022   10:46 AM 01/20/2022    1:18 PM 11/27/2021   12:47 PM 07/16/2020   12:12 PM 05/02/2018   11:11 AM  Depression screen PHQ 2/9  Decreased Interest 0 0 0 0 0 0 0  Down, Depressed, Hopeless 0 0 0 0 0 0 0  PHQ - 2 Score 0 0 0 0 0 0 0    Review of Systems  Musculoskeletal:  Positive for back pain.       Left hip pain  All other systems reviewed and are negative.      Objective:   Physical Exam  General no acute distress Mood affect appropriate Spine severe levoscoliosis No tenderness palpation in the upper lumbar region.  No tenderness in the  left lumbar paraspinal region. She has mild tenderness in the left gluteal region No pain with hip range of motion she ambulates without assistive device no evidence of toe drag or instability  Sacral thrust (prone) : Negative Lateral compression: Negative FABER's: Negative Distraction (supine): Negative Thigh thrust test: Negative    Assessment & Plan:  1.  Lumbar spondylosis without myelopathy.  She had good results with left L3 L4-5 RFA and is now experiencing severe pain on the right side.  She has previously responded for greater than 6 months had greater than 50% relief following right-sided L3-L4-L5 RFA.  Will schedule her for repeat procedure next month.  Continue tramadol  50 mg twice daily, PDMP reviewed no evidence of multiple prescribers.  Last urine drug screen was greater than 1 year ago we will repeat at next visit. Sacroiliac screening negative

## 2024-06-05 DIAGNOSIS — H409 Unspecified glaucoma: Secondary | ICD-10-CM | POA: Diagnosis not present

## 2024-06-05 DIAGNOSIS — B351 Tinea unguium: Secondary | ICD-10-CM | POA: Diagnosis not present

## 2024-06-05 DIAGNOSIS — K5130 Ulcerative (chronic) rectosigmoiditis without complications: Secondary | ICD-10-CM | POA: Diagnosis not present

## 2024-06-05 DIAGNOSIS — J479 Bronchiectasis, uncomplicated: Secondary | ICD-10-CM | POA: Diagnosis not present

## 2024-06-05 DIAGNOSIS — K120 Recurrent oral aphthae: Secondary | ICD-10-CM | POA: Diagnosis not present

## 2024-06-05 DIAGNOSIS — M85852 Other specified disorders of bone density and structure, left thigh: Secondary | ICD-10-CM | POA: Diagnosis not present

## 2024-06-05 DIAGNOSIS — E782 Mixed hyperlipidemia: Secondary | ICD-10-CM | POA: Diagnosis not present

## 2024-06-05 DIAGNOSIS — R5383 Other fatigue: Secondary | ICD-10-CM | POA: Diagnosis not present

## 2024-06-05 DIAGNOSIS — I10 Essential (primary) hypertension: Secondary | ICD-10-CM | POA: Diagnosis not present

## 2024-06-05 DIAGNOSIS — R7303 Prediabetes: Secondary | ICD-10-CM | POA: Diagnosis not present

## 2024-06-05 DIAGNOSIS — K219 Gastro-esophageal reflux disease without esophagitis: Secondary | ICD-10-CM | POA: Diagnosis not present

## 2024-06-05 DIAGNOSIS — Z79899 Other long term (current) drug therapy: Secondary | ICD-10-CM | POA: Diagnosis not present

## 2024-07-04 ENCOUNTER — Encounter: Attending: Physical Medicine & Rehabilitation | Admitting: Physical Medicine & Rehabilitation

## 2024-07-04 ENCOUNTER — Encounter: Payer: Self-pay | Admitting: Physical Medicine & Rehabilitation

## 2024-07-04 VITALS — BP 113/65 | HR 51 | Temp 98.0°F | Ht 63.0 in | Wt 126.4 lb

## 2024-07-04 DIAGNOSIS — M47817 Spondylosis without myelopathy or radiculopathy, lumbosacral region: Secondary | ICD-10-CM | POA: Diagnosis not present

## 2024-07-04 MED ORDER — LIDOCAINE HCL (PF) 2 % IJ SOLN
3.0000 mL | Freq: Once | INTRAMUSCULAR | Status: AC
  Administered 2024-07-04: 3 mL

## 2024-07-04 MED ORDER — LIDOCAINE HCL 1 % IJ SOLN
10.0000 mL | Freq: Once | INTRAMUSCULAR | Status: AC
  Administered 2024-07-04: 10 mL

## 2024-07-04 NOTE — Progress Notes (Signed)
  PROCEDURE RECORD [facility]   Name: Sandra Tapia DOB:1946/08/12 MRN: [id number]  Date:07/04/2024  Physician: Prentice Compton, MD    Nurse/CMA: Mikeal Winstanley RN  Allergies:  Allergies  Allergen Reactions   Penicillins Other (See Comments)    Nausea & stomach pains  Other reaction(s): severe stomach pain  GI Upset    Nausea & stomach pains Other reaction(s): severe stomach pain   Telmisartan  Swelling    Other reaction(s): angioedema   Wound Dressing Adhesive Dermatitis    Gel grounding pads   Adhesive [Tape] Rash    Gel grounding pads   Lisinopril Cough    Other reaction(s): cough    Consent Signed: Yes.    Is patient diabetic? No.  CBG today?   Pregnant: No. LMP: No LMP recorded. Patient is postmenopausal. (age 61-55)  Anticoagulants: no Anti-inflammatory: no Antibiotics: no  Procedure: right  lumbar 3-4-5 radiofrequency neurotomy  Position: Prone Start Time: 12:04  End Time: 12:19  Fluoro Time: 51 sec  RN/CMA CM RMA Jemimah Cressy RN    Time 11:22 12:27    BP 113/65 128/71    Pulse 51 53    Respirations 14 14    O2 Sat 96 97    S/S 6 6    Pain Level 7 numb     D/C home with husband, [name], D/C instructions reviewed, and sits independently.

## 2024-07-04 NOTE — Progress Notes (Signed)
RightL5 dorsal ramus., Right L4  radio frequency neurotomy under fluoroscopic guidance   Indication: Low back pain due to lumbar spondylosis which has been relieved on 2 occasions by greater than 50% by lumbar medial branch blocks at corresponding levels.  Informed consent was obtained after describing risks and benefits of the procedure with the patient, this includes bleeding, bruising, infection, paralysis and medication side effects. The patient wishes to proceed and has given written consent. The patient was placed in a prone position. The lumbar and sacral area was marked and prepped with Betadine. A 25-gauge 1-1/2 inch needle was inserted into the skin and subcutaneous tissue at 3 sites in one ML of 1% lidocaine was injected into each site. Then a 18-gauge 10 cm radio frequency needle with a 1 cm curved active tip was inserted targeting the Right S1 SAP/sacral ala junction. Bone contact was made and confirmed with lateral imaging.  motor stimulation at 2 Hz confirm proper needle location followed by injection of 27ml 2% MPF lidocaine. Then the Right L5 SAP/transverse process junction was targeted. Bone contact was made and confirmed with lateral imaging.  motor stimulation at 2 Hz confirm proper needle location followed by injection of 19ml 2% MPF lidocaine. Then the Right L4 SAP/transverse process junction was targeted. Bone contact was made and confirmed with lateral imaging. motor stimulation at 2 Hz caused hip pain that did not improve after needle repositioning therefore needle was removed Radio frequency lesion being at [facility] for 90 seconds was performed. Needles were removed. Post procedure instructions and vital signs were performed. Patient tolerated procedure well. Followup appointment was given.

## 2024-07-05 DIAGNOSIS — Z79899 Other long term (current) drug therapy: Secondary | ICD-10-CM | POA: Diagnosis not present

## 2024-07-14 DIAGNOSIS — Z961 Presence of intraocular lens: Secondary | ICD-10-CM | POA: Diagnosis not present

## 2024-07-14 DIAGNOSIS — H59812 Chorioretinal scars after surgery for detachment, left eye: Secondary | ICD-10-CM | POA: Diagnosis not present

## 2024-07-14 DIAGNOSIS — H35352 Cystoid macular degeneration, left eye: Secondary | ICD-10-CM | POA: Diagnosis not present

## 2024-07-14 DIAGNOSIS — H26491 Other secondary cataract, right eye: Secondary | ICD-10-CM | POA: Diagnosis not present

## 2024-07-14 DIAGNOSIS — H35431 Paving stone degeneration of retina, right eye: Secondary | ICD-10-CM | POA: Diagnosis not present

## 2024-07-14 DIAGNOSIS — H353132 Nonexudative age-related macular degeneration, bilateral, intermediate dry stage: Secondary | ICD-10-CM | POA: Diagnosis not present

## 2024-07-14 DIAGNOSIS — H43391 Other vitreous opacities, right eye: Secondary | ICD-10-CM | POA: Diagnosis not present

## 2024-07-14 DIAGNOSIS — Z8669 Personal history of other diseases of the nervous system and sense organs: Secondary | ICD-10-CM | POA: Diagnosis not present

## 2024-07-14 DIAGNOSIS — H353131 Nonexudative age-related macular degeneration, bilateral, early dry stage: Secondary | ICD-10-CM | POA: Diagnosis not present

## 2024-07-14 DIAGNOSIS — H40052 Ocular hypertension, left eye: Secondary | ICD-10-CM | POA: Diagnosis not present

## 2024-08-30 DIAGNOSIS — K5190 Ulcerative colitis, unspecified, without complications: Secondary | ICD-10-CM | POA: Diagnosis not present

## 2024-09-12 DIAGNOSIS — L570 Actinic keratosis: Secondary | ICD-10-CM | POA: Diagnosis not present

## 2024-09-12 DIAGNOSIS — L820 Inflamed seborrheic keratosis: Secondary | ICD-10-CM | POA: Diagnosis not present

## 2024-09-29 DIAGNOSIS — H40052 Ocular hypertension, left eye: Secondary | ICD-10-CM | POA: Diagnosis not present

## 2024-10-03 DIAGNOSIS — Z0000 Encounter for general adult medical examination without abnormal findings: Secondary | ICD-10-CM | POA: Diagnosis not present

## 2024-10-03 DIAGNOSIS — I10 Essential (primary) hypertension: Secondary | ICD-10-CM | POA: Diagnosis not present

## 2024-10-03 DIAGNOSIS — M85852 Other specified disorders of bone density and structure, left thigh: Secondary | ICD-10-CM | POA: Diagnosis not present

## 2024-10-03 DIAGNOSIS — Z6822 Body mass index (BMI) 22.0-22.9, adult: Secondary | ICD-10-CM | POA: Diagnosis not present

## 2024-10-03 DIAGNOSIS — Z23 Encounter for immunization: Secondary | ICD-10-CM | POA: Diagnosis not present

## 2024-10-17 DIAGNOSIS — K5289 Other specified noninfective gastroenteritis and colitis: Secondary | ICD-10-CM | POA: Diagnosis not present

## 2024-10-17 DIAGNOSIS — K6389 Other specified diseases of intestine: Secondary | ICD-10-CM | POA: Diagnosis not present

## 2024-10-17 DIAGNOSIS — K635 Polyp of colon: Secondary | ICD-10-CM | POA: Diagnosis not present

## 2024-10-17 DIAGNOSIS — K648 Other hemorrhoids: Secondary | ICD-10-CM | POA: Diagnosis not present

## 2024-10-17 DIAGNOSIS — K5730 Diverticulosis of large intestine without perforation or abscess without bleeding: Secondary | ICD-10-CM | POA: Diagnosis not present

## 2024-10-17 DIAGNOSIS — Z09 Encounter for follow-up examination after completed treatment for conditions other than malignant neoplasm: Secondary | ICD-10-CM | POA: Diagnosis not present

## 2024-10-17 DIAGNOSIS — D123 Benign neoplasm of transverse colon: Secondary | ICD-10-CM | POA: Diagnosis not present

## 2024-10-17 DIAGNOSIS — Z860101 Personal history of adenomatous and serrated colon polyps: Secondary | ICD-10-CM | POA: Diagnosis not present

## 2024-10-19 DIAGNOSIS — K5289 Other specified noninfective gastroenteritis and colitis: Secondary | ICD-10-CM | POA: Diagnosis not present

## 2024-10-19 DIAGNOSIS — K635 Polyp of colon: Secondary | ICD-10-CM | POA: Diagnosis not present

## 2024-10-19 DIAGNOSIS — K6389 Other specified diseases of intestine: Secondary | ICD-10-CM | POA: Diagnosis not present

## 2024-10-19 DIAGNOSIS — D123 Benign neoplasm of transverse colon: Secondary | ICD-10-CM | POA: Diagnosis not present

## 2024-10-27 DIAGNOSIS — H59812 Chorioretinal scars after surgery for detachment, left eye: Secondary | ICD-10-CM | POA: Diagnosis not present

## 2024-10-27 DIAGNOSIS — H26491 Other secondary cataract, right eye: Secondary | ICD-10-CM | POA: Diagnosis not present

## 2024-10-27 DIAGNOSIS — Z961 Presence of intraocular lens: Secondary | ICD-10-CM | POA: Diagnosis not present

## 2024-10-27 DIAGNOSIS — H35352 Cystoid macular degeneration, left eye: Secondary | ICD-10-CM | POA: Diagnosis not present

## 2024-10-27 DIAGNOSIS — H35431 Paving stone degeneration of retina, right eye: Secondary | ICD-10-CM | POA: Diagnosis not present

## 2024-10-27 DIAGNOSIS — H353131 Nonexudative age-related macular degeneration, bilateral, early dry stage: Secondary | ICD-10-CM | POA: Diagnosis not present

## 2024-10-27 DIAGNOSIS — H40052 Ocular hypertension, left eye: Secondary | ICD-10-CM | POA: Diagnosis not present

## 2024-10-27 DIAGNOSIS — Z8669 Personal history of other diseases of the nervous system and sense organs: Secondary | ICD-10-CM | POA: Diagnosis not present

## 2024-10-27 DIAGNOSIS — H353132 Nonexudative age-related macular degeneration, bilateral, intermediate dry stage: Secondary | ICD-10-CM | POA: Diagnosis not present

## 2024-10-27 DIAGNOSIS — H43391 Other vitreous opacities, right eye: Secondary | ICD-10-CM | POA: Diagnosis not present

## 2024-12-01 DIAGNOSIS — I10 Essential (primary) hypertension: Secondary | ICD-10-CM | POA: Diagnosis not present

## 2024-12-01 DIAGNOSIS — E782 Mixed hyperlipidemia: Secondary | ICD-10-CM | POA: Diagnosis not present

## 2024-12-01 DIAGNOSIS — Z79899 Other long term (current) drug therapy: Secondary | ICD-10-CM | POA: Diagnosis not present

## 2024-12-01 DIAGNOSIS — R7303 Prediabetes: Secondary | ICD-10-CM | POA: Diagnosis not present

## 2025-01-02 ENCOUNTER — Encounter: Payer: Self-pay | Admitting: Physical Medicine & Rehabilitation

## 2025-01-02 ENCOUNTER — Encounter: Attending: Physical Medicine & Rehabilitation | Admitting: Physical Medicine & Rehabilitation

## 2025-01-02 VITALS — BP 122/75 | HR 54 | Ht 63.0 in | Wt 124.0 lb

## 2025-01-02 DIAGNOSIS — M47817 Spondylosis without myelopathy or radiculopathy, lumbosacral region: Secondary | ICD-10-CM | POA: Insufficient documentation

## 2025-01-02 DIAGNOSIS — M419 Scoliosis, unspecified: Secondary | ICD-10-CM | POA: Diagnosis not present

## 2025-01-02 MED ORDER — TRAMADOL HCL 50 MG PO TABS: 50.0000 mg | ORAL_TABLET | Freq: Two times a day (BID) | ORAL | 5 refills | Status: AC

## 2025-01-02 NOTE — Progress Notes (Signed)
 "  Subjective:    Patient ID: Sandra Tapia, female    DOB: 1946/09/19, 79 y.o.   MRN: [id number]  HPI Discussed the use of AI scribe software for clinical note transcription with the patient, who gave verbal consent to proceed.  History of Present Illness Sandra Tapia is a 79 year old female with severe lumbar scoliosis and spondylosis who presents for follow-up of chronic low back pain after bilateral lumbar radiofrequency ablation.  She underwent left-sided lumbar (L3, L4, L5) radiofrequency ablation in May 2025 and right-sided ablation in July 2025. Since the procedures, her pain has remained stable without significant worsening. Pain is primarily provoked by activity, such as walking or overexertion, while she remains comfortable at rest or during routine activities. She describes her pain as unchanged compared to prior visits.  She maintains an active lifestyle, walking most mornings for approximately 30 minutes and nearly two miles, though ambulation can be difficult at times due to pain. She takes tramadol  prior to walking, which provides symptomatic relief. After walking, she is able to rest briefly and resume daily activities, including household chores and holiday decorating. She makes biweekly trips involving prolonged car rides, which previously exacerbated her sitting pain, but this has improved since the ablation procedures.  She utilizes a lumbar support pillow and heated car seat for comfort during extended sitting. She has both lower and upper back braces, which she wears most often when walking; she rarely wears both simultaneously. These braces provide some relief during ambulation.  She denies new or worsening radicular pain. She notes chronic numbness and paresthesia in one toe and increased numbness in one leg, which have been longstanding and unchanged. She denies significant leg pain. She experiences increased difficulty walking quickly, particularly after  prolonged sitting, but is able to walk briskly outside in the mornings without much issue.    Pain Inventory Average Pain 7 Pain Right Now 7 My pain is intermittent, burning, dull, and aching  In the last 24 hours, has pain interfered with the following? General activity 5 Relation with others 5 Enjoyment of life 5 What TIME of day is your pain at its worst? daytime Sleep (in general) Fair  Pain is worse with: walking, standing, and some activites Pain improves with: heat/ice, therapy/exercise, pacing activities, and medication Relief from Meds: fair  Family History  Problem Relation Age of Onset   Hypertension Father    Macular degeneration Father    Social History   Socioeconomic History   Marital status: Married    Spouse name: Not on file   Number of children: Not on file   Years of education: Not on file   Highest education level: Not on file  Occupational History   Not on file  Tobacco Use   Smoking status: Never   Smokeless tobacco: Never  Vaping Use   Vaping status: Never Used  Substance and Sexual Activity   Alcohol use: Not Currently    Alcohol/week: 7.0 standard drinks of alcohol    Types: 7 drink(s) per week    Comment: 6-7 glasses of wine a week   Drug use: No   Sexual activity: Yes    Partners: Male    Birth control/protection: Post-menopausal  Other Topics Concern   Not on file  Social History Narrative   Not on file   Social Drivers of Health   Tobacco Use: Low Risk  (10/27/2024)   Received from [facility]   Patient History  Smoking Tobacco Use: Never    Smokeless Tobacco Use: Never    Passive Exposure: Not on file  Financial Resource Strain: Not on file  Food Insecurity: Not on file  Transportation Needs: Not on file  Physical Activity: Not on file  Stress: Not on file  Social Connections: Not on file  Depression (PHQ2-9): Low Risk (01/02/2025)   Depression (PHQ2-9)    PHQ-2 Score: 0  Alcohol Screen: Not on  file  Housing: Unknown (01/07/2024)   Received from [facility]   Epic    Unable to Pay for Housing in the Last Year: Not on file    Number of Times Moved in the Last Year: Not on file    At any time in the past 12 months, were you homeless or living in a shelter (including now)?: No  Utilities: Not on file  Health Literacy: Not on file   Past Surgical History:  Procedure Laterality Date   ANKLE SURGERY     BREAST SURGERY  07/28/2008   (Rt)Lumpectomy,sentinel node   EYE SURGERY     MASTECTOMY, PARTIAL Right 07/31/2008   right node Bx (neg)   RETINAL DETACHMENT SURGERY  june 28th 2012   TONSILLECTOMY AND ADENOIDECTOMY     Past Surgical History:  Procedure Laterality Date   ANKLE SURGERY     BREAST SURGERY  07/28/2008   (Rt)Lumpectomy,sentinel node   EYE SURGERY     MASTECTOMY, PARTIAL Right 07/31/2008   right node Bx (neg)   RETINAL DETACHMENT SURGERY  june 28th 2012   TONSILLECTOMY AND ADENOIDECTOMY     Past Medical History:  Diagnosis Date   Aneurysm of aorta 8/09   4.1cm ascending aortic aneurysm on breast MRI   Basal cell cancer    Cancer (HCC) 07/2008   breast cancer(rt)   Degenerative disc disease    neck   Elevated cholesterol    Fibroid    Hypertension    Infertility, female    Spinal arthritis    Ht 5' 3 (1.6 m)   Wt 124 lb (56.2 kg)   BMI 21.97 kg/m   Opioid Risk Score:   Fall Risk Score:  `1  Depression screen [facility] 2/9     01/02/2025   11:51 AM 06/02/2024   12:56 PM 05/02/2024   11:09 AM 04/04/2024    2:28 PM 03/24/2022   10:46 AM 01/20/2022    1:18 PM 11/27/2021   12:47 PM  Depression screen PHQ 2/9  Decreased Interest 0 0 0 0 0 0 0  Down, Depressed, Hopeless 0 0 0 0 0 0 0  PHQ - 2 Score 0 0 0 0 0 0 0    Review of Systems  Musculoskeletal:  Positive for back pain.       Pain in both hips  All other systems reviewed and are negative.      Objective:   Physical Exam        Assessment & Plan:   Assessment and  Plan Assessment & Plan Chronic low back pain with lumbar spondylosis and scoliosis Pain well-managed post-bilateral lumbar radiofrequency ablation. Persistent activity-related discomfort without significant progression. Multifactorial pain due to lumbar facet arthropathy, scoliosis, and degenerative changes. Functional mobility maintained, no new or worsening radicular symptoms. Surgery not recommended due to high morbidity and limited benefit. - Refilled tramadol  prescription. - Advised continued use of lumbar pillow, heated seat, and back braces as tolerated. - Recommended monitoring for worsening pain, especially with sitting, to assess need for  repeat radiofrequency ablation. - Discussed physical therapy as a future option if mobility declines. - Instructed to report increasing pain with sitting or new radicular symptoms. - Scheduled follow-up in six months.   "

## 2025-03-05 ENCOUNTER — Ambulatory Visit: Admitting: Internal Medicine

## 2025-03-06 ENCOUNTER — Ambulatory Visit (HOSPITAL_COMMUNITY)

## 2025-04-27 ENCOUNTER — Ambulatory Visit: Admitting: Cardiology

## 2025-07-03 ENCOUNTER — Encounter: Admitting: Physical Medicine & Rehabilitation
